# Patient Record
Sex: Female | Born: 1940 | Race: White | Hispanic: No | Smoking: Never smoker
Health system: Southern US, Community
[De-identification: ages and names within clinical notes are randomized; demographics above are authoritative.]

## PROBLEM LIST (undated history)

## (undated) DIAGNOSIS — K2100 Gastro-esophageal reflux disease with esophagitis, without bleeding: Secondary | ICD-10-CM

## (undated) DIAGNOSIS — I10 Essential (primary) hypertension: Secondary | ICD-10-CM

## (undated) DIAGNOSIS — F419 Anxiety disorder, unspecified: Secondary | ICD-10-CM

## (undated) DIAGNOSIS — I639 Cerebral infarction, unspecified: Secondary | ICD-10-CM

## (undated) DIAGNOSIS — J45909 Unspecified asthma, uncomplicated: Secondary | ICD-10-CM

## (undated) DIAGNOSIS — E785 Hyperlipidemia, unspecified: Secondary | ICD-10-CM

## (undated) DIAGNOSIS — Q782 Osteopetrosis: Secondary | ICD-10-CM

## (undated) DIAGNOSIS — I2510 Atherosclerotic heart disease of native coronary artery without angina pectoris: Secondary | ICD-10-CM

## (undated) HISTORY — PX: SHOULDER SURGERY: SHX246

---

## 2005-11-01 ENCOUNTER — Ambulatory Visit: Payer: Self-pay | Admitting: Family Medicine

## 2005-11-28 ENCOUNTER — Ambulatory Visit: Payer: Self-pay | Admitting: Family Medicine

## 2007-09-04 ENCOUNTER — Emergency Department: Payer: Self-pay | Admitting: Emergency Medicine

## 2010-02-09 ENCOUNTER — Ambulatory Visit: Payer: Self-pay | Admitting: Family Medicine

## 2011-01-01 DIAGNOSIS — E785 Hyperlipidemia, unspecified: Secondary | ICD-10-CM | POA: Insufficient documentation

## 2011-01-01 DIAGNOSIS — I10 Essential (primary) hypertension: Secondary | ICD-10-CM | POA: Insufficient documentation

## 2011-03-13 ENCOUNTER — Ambulatory Visit: Payer: Self-pay | Admitting: Family Medicine

## 2012-07-01 DIAGNOSIS — F419 Anxiety disorder, unspecified: Secondary | ICD-10-CM | POA: Insufficient documentation

## 2012-07-28 ENCOUNTER — Ambulatory Visit: Payer: Self-pay | Admitting: Family Medicine

## 2013-08-10 ENCOUNTER — Ambulatory Visit: Payer: Self-pay | Admitting: Family Medicine

## 2014-12-29 DIAGNOSIS — M7582 Other shoulder lesions, left shoulder: Secondary | ICD-10-CM | POA: Insufficient documentation

## 2015-10-25 ENCOUNTER — Other Ambulatory Visit: Payer: Self-pay | Admitting: Family Medicine

## 2015-10-25 DIAGNOSIS — Z1231 Encounter for screening mammogram for malignant neoplasm of breast: Secondary | ICD-10-CM

## 2015-10-25 DIAGNOSIS — M810 Age-related osteoporosis without current pathological fracture: Secondary | ICD-10-CM

## 2015-11-23 ENCOUNTER — Ambulatory Visit
Admission: RE | Admit: 2015-11-23 | Discharge: 2015-11-23 | Disposition: A | Discharge status: Home / Self Care | Payer: Medicare Other | Source: Ambulatory Visit | Attending: Family Medicine | Admitting: Family Medicine

## 2015-11-23 ENCOUNTER — Other Ambulatory Visit: Payer: Self-pay | Admitting: Family Medicine

## 2015-11-23 DIAGNOSIS — M810 Age-related osteoporosis without current pathological fracture: Secondary | ICD-10-CM | POA: Insufficient documentation

## 2015-11-23 DIAGNOSIS — Z1231 Encounter for screening mammogram for malignant neoplasm of breast: Secondary | ICD-10-CM | POA: Diagnosis not present

## 2015-11-23 DIAGNOSIS — M85851 Other specified disorders of bone density and structure, right thigh: Secondary | ICD-10-CM | POA: Diagnosis not present

## 2015-11-23 DIAGNOSIS — Z8262 Family history of osteoporosis: Secondary | ICD-10-CM | POA: Diagnosis not present

## 2016-04-27 ENCOUNTER — Emergency Department
Admission: EM | Admit: 2016-04-27 | Discharge: 2016-04-27 | Disposition: A | Discharge status: Home / Self Care | Payer: Medicare Other | Attending: Emergency Medicine | Admitting: Emergency Medicine

## 2016-04-27 ENCOUNTER — Encounter: Payer: Self-pay | Admitting: Emergency Medicine

## 2016-04-27 DIAGNOSIS — J45909 Unspecified asthma, uncomplicated: Secondary | ICD-10-CM | POA: Insufficient documentation

## 2016-04-27 DIAGNOSIS — I2510 Atherosclerotic heart disease of native coronary artery without angina pectoris: Secondary | ICD-10-CM | POA: Insufficient documentation

## 2016-04-27 DIAGNOSIS — Y999 Unspecified external cause status: Secondary | ICD-10-CM | POA: Diagnosis not present

## 2016-04-27 DIAGNOSIS — I10 Essential (primary) hypertension: Secondary | ICD-10-CM | POA: Insufficient documentation

## 2016-04-27 DIAGNOSIS — W260XXA Contact with knife, initial encounter: Secondary | ICD-10-CM | POA: Insufficient documentation

## 2016-04-27 DIAGNOSIS — Y92000 Kitchen of unspecified non-institutional (private) residence as  the place of occurrence of the external cause: Secondary | ICD-10-CM | POA: Insufficient documentation

## 2016-04-27 DIAGNOSIS — Y939 Activity, unspecified: Secondary | ICD-10-CM | POA: Diagnosis not present

## 2016-04-27 DIAGNOSIS — S61210A Laceration without foreign body of right index finger without damage to nail, initial encounter: Secondary | ICD-10-CM | POA: Insufficient documentation

## 2016-04-27 HISTORY — DX: Essential (primary) hypertension: I10

## 2016-04-27 HISTORY — DX: Osteopetrosis: Q78.2

## 2016-04-27 HISTORY — DX: Atherosclerotic heart disease of native coronary artery without angina pectoris: I25.10

## 2016-04-27 HISTORY — DX: Hyperlipidemia, unspecified: E78.5

## 2016-04-27 HISTORY — DX: Gastro-esophageal reflux disease with esophagitis, without bleeding: K21.00

## 2016-04-27 HISTORY — DX: Unspecified asthma, uncomplicated: J45.909

## 2016-04-27 HISTORY — DX: Gastro-esophageal reflux disease with esophagitis: K21.0

## 2016-04-27 HISTORY — DX: Anxiety disorder, unspecified: F41.9

## 2016-04-27 NOTE — ED Provider Notes (Signed)
[facility] Emergency Department Provider Note  ____________________________________________  Time seen: Approximately 8:54 PM  I have reviewed the triage vital signs and the nursing notes.   HISTORY  Chief Complaint Extremity Laceration    HPI Monica Bush Test is a 75 y.o. female that presents to the emergency department with laceration from a kitchen knife over right index finger. Patient states that she did not think the laceration was very deep but it kept bleeding when she bent her finger.She applied liquid bandage to finger. Patient took Tylenol for pain. Patient's last tetanus shot was less than one year ago.   Past Medical History:  Diagnosis Date  . Anxiety   . Asthma   . CAD (coronary artery disease)   . Hyperlipemia   . Hypertension   . Osteopetrosis   . Reflux esophagitis     There are no active problems to display for this patient.   Past Surgical History:  Procedure Laterality Date  . SHOULDER SURGERY      Prior to Admission medications   Not on File    Allergies Alprazolam; Clarithromycin; Levaquin [levofloxacin in d5w]; Tramadol; Xopenex [levalbuterol hcl]; and Mobic [meloxicam]  Family History  Problem Relation Age of Onset  . Breast cancer Neg Hx     Social History Social History  Substance Use Topics  . Smoking status: Never Smoker  . Smokeless tobacco: Never Used  . Alcohol use Yes     Review of Systems  Constitutional: No fever/chills Cardiovascular: No chest pain. Respiratory: No SOB. Gastrointestinal: No abdominal pain.  No nausea, no vomiting.  Skin: Negative for ecchymosis. Neurological: Negative for numbness or tingling   ____________________________________________   PHYSICAL EXAM:  VITAL SIGNS: ED Triage Vitals [04/27/16 2005]  Enc Vitals Group     BP (!) 156/78     Pulse Rate 84     Resp 18     Temp 98.7 F (37.1 C)     Temp Source Oral     SpO2 97 %     Weight 164 lb (74.4 kg)   Height 5\' 1"  (1.549 m)     Head Circumference      Peak Flow      Pain Score 2     Pain Loc      Pain Edu?      Excl. in GC?      Constitutional: Alert and oriented. Well appearing and in no acute distress. Eyes: Conjunctivae are normal. PERRL. EOMI. Head: Atraumatic. ENT:      Ears:      Nose: No congestion/rhinnorhea.      Mouth/Throat: Mucous membranes are moist.  Neck: No stridor.  Cardiovascular: Normal rate, regular rhythm. Normal S1 and S2.  Good peripheral circulation. Respiratory: Normal respiratory effort without tachypnea or retractions. Lungs CTAB. Good air entry to the bases with no decreased or absent breath sounds. Musculoskeletal: Full range of motion to all extremities. No gross deformities appreciated. Full range of motion of finger. Neurologic:  Normal speech and language. No gross focal neurologic deficits are appreciated. Sensation of fingertip intact. Skin:  Skin is warm, dry. No rash noted. 1/2 cm shallow laceration over side of right index finger. No bleeding. Psychiatric: Mood and affect are normal. Speech and behavior are normal. Patient exhibits appropriate insight and judgement.   ____________________________________________   LABS (all labs ordered are listed, but only abnormal results are displayed)  Labs Reviewed - No data to display ____________________________________________  EKG   ____________________________________________  RADIOLOGY  No results found.  ____________________________________________    PROCEDURES  Procedure(s) performed:    Procedures  Wound was cleaned with normal saline and iodine. Dermabond and Steri-Strips were applied.  Medications - No data to display   ____________________________________________   INITIAL IMPRESSION / ASSESSMENT AND PLAN / ED COURSE  Pertinent labs & imaging results that were available during my care of the patient were reviewed by me and considered in my medical decision  making (see chart for details).  Review of the [place] CSRS was performed in accordance of the [facility] prior to dispensing any controlled drugs.  Clinical Course     Patient's diagnosis is consistent with finger laceration. Wound was cleaned. Dermabond and Steri-Strips were applied. Stitches are not deemed necessary as the laceration was very shallow. Tetanus shot is up-to-date. Patient is to follow up with PCP as directed. Patient is given ED precautions to return to the ED for any worsening or new symptoms.     ____________________________________________  FINAL CLINICAL IMPRESSION(S) / ED DIAGNOSES  Final diagnoses:  Laceration of right index finger without foreign body without damage to nail, initial encounter      NEW MEDICATIONS STARTED DURING THIS VISIT:  There are no discharge medications for this patient.       This chart was dictated using voice recognition software/Dragon. Despite best efforts to proofread, errors can occur which can change the meaning. Any change was purely unintentional.    Enid Derryshley Tandre Conly, PA-C 04/27/16 2214    Jennye MoccasinBrian S Quigley, MD 04/27/16 2217

## 2016-04-27 NOTE — ED Notes (Signed)
Patient reports she was reaching for a knife at 1900 today and cut her index finger right hand proximal to hand. Pt has 1/2" laceration to finger. Pt reports she applied liquid bandage to laceration, however continues to have bleeding with movement.

## 2016-04-27 NOTE — ED Triage Notes (Signed)
presents to ED with laceration to right index finger. Bleeding controlled.

## 2016-04-27 NOTE — ED Notes (Signed)
Pt alert and oriented X4, active, cooperative, pt in NAD. RR even and unlabored, color WNL.  Pt informed to return if any life threatening symptoms occur.   

## 2016-10-23 ENCOUNTER — Other Ambulatory Visit: Payer: Self-pay | Admitting: Family Medicine

## 2016-10-23 DIAGNOSIS — Z1231 Encounter for screening mammogram for malignant neoplasm of breast: Secondary | ICD-10-CM

## 2016-11-20 ENCOUNTER — Emergency Department: Payer: Medicare Other

## 2016-11-20 ENCOUNTER — Encounter: Payer: Self-pay | Admitting: Emergency Medicine

## 2016-11-20 ENCOUNTER — Emergency Department
Admission: EM | Admit: 2016-11-20 | Discharge: 2016-11-20 | Disposition: A | Discharge status: Home / Self Care | Payer: Medicare Other | Attending: Emergency Medicine | Admitting: Emergency Medicine

## 2016-11-20 DIAGNOSIS — Z79899 Other long term (current) drug therapy: Secondary | ICD-10-CM | POA: Diagnosis not present

## 2016-11-20 DIAGNOSIS — I10 Essential (primary) hypertension: Secondary | ICD-10-CM | POA: Insufficient documentation

## 2016-11-20 DIAGNOSIS — W228XXA Striking against or struck by other objects, initial encounter: Secondary | ICD-10-CM | POA: Insufficient documentation

## 2016-11-20 DIAGNOSIS — S0083XA Contusion of other part of head, initial encounter: Secondary | ICD-10-CM | POA: Insufficient documentation

## 2016-11-20 DIAGNOSIS — Y929 Unspecified place or not applicable: Secondary | ICD-10-CM | POA: Diagnosis not present

## 2016-11-20 DIAGNOSIS — J45909 Unspecified asthma, uncomplicated: Secondary | ICD-10-CM | POA: Insufficient documentation

## 2016-11-20 DIAGNOSIS — Y939 Activity, unspecified: Secondary | ICD-10-CM | POA: Diagnosis not present

## 2016-11-20 DIAGNOSIS — R519 Headache, unspecified: Secondary | ICD-10-CM

## 2016-11-20 DIAGNOSIS — Y998 Other external cause status: Secondary | ICD-10-CM | POA: Insufficient documentation

## 2016-11-20 DIAGNOSIS — Z7982 Long term (current) use of aspirin: Secondary | ICD-10-CM | POA: Diagnosis not present

## 2016-11-20 DIAGNOSIS — R51 Headache: Secondary | ICD-10-CM | POA: Diagnosis present

## 2016-11-20 DIAGNOSIS — I2510 Atherosclerotic heart disease of native coronary artery without angina pectoris: Secondary | ICD-10-CM | POA: Insufficient documentation

## 2016-11-20 MED ORDER — BUTALBITAL-APAP-CAFFEINE 50-325-40 MG PO TABS: 1.0000 | ORAL_TABLET | Freq: Four times a day (QID) | ORAL | 0 refills | Status: AC | PRN

## 2016-11-20 NOTE — ED Provider Notes (Signed)
[facility] Emergency Department Provider Note   ____________________________________________   First MD Initiated Contact with Patient 11/20/16 [phone]     (approximate)  I have reviewed the triage vital signs and the nursing notes.   HISTORY  Chief Complaint Headache    HPI Monica Bush is a 76 y.o. female patient complaining of increasing headache since striking her forehead on a table yesterday. Patient denies vision change, vertigo, nausea or vomiting. Patient state mild relief taking Tylenol. Patient state lack of sleep last night secondary to headache. Patient rates her headache as a 4/10. Patient described the pain as "achy".   Past Medical History:  Diagnosis Date  . Anxiety   . Asthma   . CAD (coronary artery disease)   . Hyperlipemia   . Hypertension   . Osteopetrosis   . Reflux esophagitis     There are no active problems to display for this patient.   Past Surgical History:  Procedure Laterality Date  . SHOULDER SURGERY      Prior to Admission medications   Medication Sig Start Date End Date Taking? Authorizing Provider  amLODipine (NORVASC) 10 MG tablet Take 10 mg by mouth daily.   Yes [provider]  aspirin 81 MG chewable tablet Chew 81 mg by mouth daily.   Yes [provider]  atorvastatin (LIPITOR) 40 MG tablet Take 40 mg by mouth daily.   Yes [provider]  butalbital-acetaminophen-caffeine (FIORICET, ESGIC) [phone] MG tablet Take 1 tablet by mouth every 6 (six) hours as needed for headache. 11/20/16 11/20/17  Joni Reining, PA-C    Allergies Alprazolam; Clarithromycin; Levaquin [levofloxacin in d5w]; Tramadol; Xopenex [levalbuterol hcl]; and Mobic [meloxicam]  Family History  Problem Relation Age of Onset  . Breast cancer Neg Hx     Social History Social History  Substance Use Topics  . Smoking status: Never Smoker  . Smokeless tobacco: Never Used  . Alcohol use Yes    Review  of Systems  Constitutional: No fever/chills Eyes: No visual changes. ENT: No sore throat. Cardiovascular: Denies chest pain. Respiratory: Denies shortness of breath. Gastrointestinal: No abdominal pain.  No nausea, no vomiting.  No diarrhea.  No constipation. Genitourinary: Negative for dysuria. Musculoskeletal: Negative for back pain. Skin: Negative for rash. Neurological: Positive for headaches, denies focal weakness or numbness. Psychiatric:Anxiety Endocrine:Hyperlipidemia hypertension Allergic/Immunilogical: See medication list ____________________________________________   PHYSICAL EXAM:  VITAL SIGNS: ED Triage Vitals [11/20/16 0838]  Enc Vitals Group     BP (!) 142/81     Pulse Rate 78     Resp 17     Temp 98.6 F (37 C)     Temp Source Oral     SpO2 100 %     Weight 157 lb (71.2 kg)     Height 4\' 11"  (1.499 m)     Head Circumference      Peak Flow      Pain Score 4     Pain Loc      Pain Edu?      Excl. in GC?     Constitutional: Alert and oriented. Well appearing and in no acute distress. Eyes: Conjunctivae are normal. PERRL. EOMI. Head: Moderate guarding palpation forehead.  Nose: No congestion/rhinnorhea. Mouth/Throat: Mucous membranes are moist.  Oropharynx non-erythematous. Neck: No stridor.   Cardiovascular: Normal rate, regular rhythm. Grossly normal heart sounds.  Good peripheral circulation. Respiratory: Normal respiratory effort.  No retractions. Lungs CTAB. Neurologic:  Normal speech and language. No gross  focal neurologic deficits are appreciated. No gait instability. Skin:  Skin is warm, dry and intact. No rash noted. Psychiatric: Mood and affect are normal. Speech and behavior are normal.  ____________________________________________   LABS (all labs ordered are listed, but only abnormal results are displayed)  Labs Reviewed - No data to  display ____________________________________________  EKG   ____________________________________________  RADIOLOGY  Ct Head Wo Contrast  Result Date: 11/20/2016 CLINICAL DATA:  Hit head on the right side yesterday.  Frontal pain. EXAM: CT HEAD WITHOUT CONTRAST TECHNIQUE: Contiguous axial images were obtained from the base of the skull through the vertex without intravenous contrast. COMPARISON:  None. FINDINGS: Brain: No evidence of acute infarction, hemorrhage, hydrocephalus, extra-axial collection or mass lesion/mass effect. 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 Mild periventricular white matter low attenuation as can be seen with microvascular disease. Vascular: No hyperdense vessel or unexpected calcification. Skull: No osseous abnormality. Sinuses/Orbits: Visualized paranasal sinuses are clear. Visualized mastoid sinuses are clear. Visualized orbits demonstrate no focal abnormality. Other: None IMPRESSION: 1. 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 2. Otherwise, no acute intracranial pathology. Electronically Signed   By: Elige KoHetal  Patel   On: 11/20/2016 09:25    ____No acute findings on CT of the head ________________________________________   PROCEDURES  Procedure(s) performed: None  Procedures  Critical Care performed: No  ____________________________________________   INITIAL IMPRESSION / ASSESSMENT AND PLAN / ED COURSE  Pertinent labs & imaging results that were available during my care of the patient were reviewed by me and considered in my medical decision making (see chart for details).  Headache secondary to forehead contusion. Discussed negative findings on CT scan in the head with patient. Patient given discharge care instructions. Patient advised follow-up PCP headaches  persists.      ____________________________________________   FINAL CLINICAL IMPRESSION(S) / ED DIAGNOSES  Final diagnoses:  Bad headache      NEW MEDICATIONS STARTED DURING THIS VISIT:  New Prescriptions   BUTALBITAL-ACETAMINOPHEN-CAFFEINE (FIORICET, ESGIC) 50-325-40 MG TABLET    Take 1 tablet by mouth every 6 (six) hours as needed for headache.     Note:  This document was prepared using Dragon voice recognition software and may include unintentional dictation errors.    Joni ReiningSmith, Bee Hammerschmidt K, PA-C 11/20/16 [id number]    Rockne MenghiniNorman, Anne-Caroline, MD 11/20/16 [phone]

## 2016-11-20 NOTE — ED Notes (Signed)
See triage note  states she was cleaning under a table yesterday  Hit the edge of table  conts to have headache  Denies any n/v or blurred vision

## 2016-11-20 NOTE — Discharge Instructions (Signed)
Take medication as directed.

## 2016-11-20 NOTE — ED Triage Notes (Signed)
Pt states she was cleaning a high top table yesterday when she went to stand up she hit her head on the edge of the table and has had a HA since. Denies any blurred vision, vomiting or any other neuro sx..Marland Kitchen

## 2016-11-26 ENCOUNTER — Ambulatory Visit
Admission: RE | Admit: 2016-11-26 | Discharge: 2016-11-26 | Disposition: A | Discharge status: Home / Self Care | Payer: Medicare Other | Source: Ambulatory Visit | Attending: Family Medicine | Admitting: Family Medicine

## 2016-11-26 DIAGNOSIS — Z1231 Encounter for screening mammogram for malignant neoplasm of breast: Secondary | ICD-10-CM | POA: Diagnosis not present

## 2017-10-31 ENCOUNTER — Other Ambulatory Visit: Payer: Self-pay | Admitting: Family Medicine

## 2017-10-31 DIAGNOSIS — Z780 Asymptomatic menopausal state: Secondary | ICD-10-CM

## 2017-10-31 DIAGNOSIS — Z1231 Encounter for screening mammogram for malignant neoplasm of breast: Secondary | ICD-10-CM

## 2017-12-05 ENCOUNTER — Other Ambulatory Visit: Payer: Medicare Other

## 2017-12-25 ENCOUNTER — Ambulatory Visit
Admission: RE | Admit: 2017-12-25 | Discharge: 2017-12-25 | Disposition: A | Discharge status: Home / Self Care | Payer: Medicare Other | Source: Ambulatory Visit | Attending: Family Medicine | Admitting: Family Medicine

## 2017-12-25 DIAGNOSIS — Z780 Asymptomatic menopausal state: Secondary | ICD-10-CM | POA: Diagnosis present

## 2017-12-25 DIAGNOSIS — Z1231 Encounter for screening mammogram for malignant neoplasm of breast: Secondary | ICD-10-CM | POA: Insufficient documentation

## 2018-05-29 ENCOUNTER — Encounter: Payer: Self-pay | Admitting: *Deleted

## 2018-05-29 ENCOUNTER — Encounter: Payer: Medicare Other | Attending: Cardiology | Admitting: *Deleted

## 2018-05-29 VITALS — Ht 61.5 in | Wt 163.3 lb

## 2018-05-29 DIAGNOSIS — Z955 Presence of coronary angioplasty implant and graft: Secondary | ICD-10-CM

## 2018-05-29 DIAGNOSIS — M810 Age-related osteoporosis without current pathological fracture: Secondary | ICD-10-CM | POA: Insufficient documentation

## 2018-05-29 DIAGNOSIS — Z9861 Coronary angioplasty status: Secondary | ICD-10-CM | POA: Insufficient documentation

## 2018-05-29 DIAGNOSIS — I2510 Atherosclerotic heart disease of native coronary artery without angina pectoris: Secondary | ICD-10-CM | POA: Insufficient documentation

## 2018-05-29 DIAGNOSIS — J45901 Unspecified asthma with (acute) exacerbation: Secondary | ICD-10-CM | POA: Insufficient documentation

## 2018-05-29 NOTE — Progress Notes (Signed)
Daily Session Note  Patient Details  Name: Monica Bush MRN: [id number] Date of Birth: [birth year] Referring Provider:     Cardiac Rehab from 05/29/2018 in [facility] [place] [facility]  Referring Provider  kelsey      Encounter Date: 05/29/2018  Check In: Session Check In - 05/29/18 1459      Check-In   Supervising physician immediately available to respond to emergencies  See telemetry face sheet for immediately available ER MD    Location  [facility]-Cardiac & Pulmonary Rehab    Staff Present  Renita Papa, RN Vickki Hearing, BA, ACSM CEP, Exercise Physiologist    Medication changes reported      No    Fall or balance concerns reported     No    Warm-up and Cool-down  Not performed (comment)   med review completed   Resistance Training Performed  Yes    VAD Patient?  No    PAD/SET Patient?  No      Pain Assessment   Currently in Pain?  No/denies        Exercise Prescription Changes - 05/29/18 1400      Response to Exercise   Blood Pressure (Admit)  124/70    Blood Pressure (Exercise)  124/66    Blood Pressure (Exit)  122/66    Heart Rate (Admit)  72 bpm    Heart Rate (Exercise)  112 bpm    Heart Rate (Exit)  75 bpm    Oxygen Saturation (Admit)  98 %    Oxygen Saturation (Exercise)  99 %    Rating of Perceived Exertion (Exercise)  11       Social History   Tobacco Use  Smoking Status Never Smoker  Smokeless Tobacco Never Used    Goals Met:  Proper associated with RPD/PD & O2 Sat Exercise tolerated well Personal goals reviewed No report of cardiac concerns or symptoms Strength training completed today  Goals Unmet:  Not Applicable  Comments: Med Review completed   Dr. Emily Filbert is Medical Director for [facility].

## 2018-05-29 NOTE — Patient Instructions (Signed)
Patient Instructions  Patient Details  Name: Monica Bush MRN: [id number] Date of Birth: 19-Apr-1941 Referring Provider:  Ramond Dial,*  Below are your personal goals for exercise, nutrition, and risk factors. Our goal is to help you stay on track towards obtaining and maintaining these goals. We will be discussing your progress on these goals with you throughout the program.  Initial Exercise Prescription: Initial Exercise Prescription - 05/29/18 1400      Date of Initial Exercise RX and Referring Provider   Date  05/29/18    Referring Provider  kelsey      Treadmill   MPH  1.7    Grade  0    Minutes  15    METs  2.3      NuStep   Level  2    SPM  80    Minutes  15    METs  2.3      Biostep-RELP   Level  2    SPM  50    Minutes  15    METs  2      Prescription Details   Frequency (times per week)  3    Duration  Progress to 30 minutes of continuous aerobic without signs/symptoms of physical distress      Intensity   THRR 40-80% of Max Heartrate  101-130    Ratings of Perceived Exertion  11-15    Perceived Dyspnea  0-4      Resistance Training   Training Prescription  Yes    Weight  3 lb    Reps  10-15       Exercise Goals: Frequency: Be able to perform aerobic exercise two to three times per week in program working toward 2-5 days per week of home exercise.  Intensity: Work with a perceived exertion of 11 (fairly light) - 15 (hard) while following your exercise prescription.  We will make changes to your prescription with you as you progress through the program.   Duration: Be able to do 30 to 45 minutes of continuous aerobic exercise in addition to a 5 minute warm-up and a 5 minute cool-down routine.   Nutrition Goals: Your personal nutrition goals will be established when you do your nutrition analysis with the dietician.  The following are general nutrition guidelines to follow: Cholesterol < 200mg /day Sodium < 1500mg /day Fiber: Women over 50  yrs - 21 grams per day  Personal Goals: Personal Goals and Risk Factors at Admission - 05/29/18 1512      Core Components/Risk Factors/Patient Goals on Admission    Weight Management  Yes;Weight Loss    Intervention  Weight Management: Develop a combined nutrition and exercise program designed to reach desired caloric intake, while maintaining appropriate intake of nutrient and fiber, sodium and fats, and appropriate energy expenditure required for the weight goal.;Weight Management: Provide education and appropriate resources to help participant work on and attain dietary goals.;Weight Management/Obesity: Establish reasonable short term and long term weight goals.    Admit Weight  163 lb 4.8 oz (74.1 kg)    Goal Weight: Short Term  158 lb (71.7 kg)    Goal Weight: Long Term  143 lb (64.9 kg)    Expected Outcomes  Short Term: Continue to assess and modify interventions until short term weight is achieved;Long Term: Adherence to nutrition and physical activity/exercise program aimed toward attainment of established weight goal;Weight Loss: Understanding of general recommendations for a balanced deficit meal plan, which promotes 1-2 lb weight loss per  week and includes a negative energy balance of [id number] kcal/d;Understanding recommendations for meals to include 15-35% energy as protein, 25-35% energy from fat, 35-60% energy from carbohydrates, less than 200mg  of dietary cholesterol, 20-35 gm of total fiber daily;Understanding of distribution of calorie intake throughout the day with the consumption of 4-5 meals/snacks    Hypertension  Yes    Intervention  Provide education on lifestyle modifcations including regular physical activity/exercise, weight management, moderate sodium restriction and increased consumption of fresh fruit, vegetables, and low fat dairy, alcohol moderation, and smoking cessation.;Monitor prescription use compliance.    Expected Outcomes  Short Term: Continued assessment and  intervention until BP is < 140/10590mm HG in hypertensive participants. < 130/3180mm HG in hypertensive participants with diabetes, heart failure or chronic kidney disease.;Long Term: Maintenance of blood pressure at goal levels.    Lipids  Yes    Intervention  Provide education and support for participant on nutrition & aerobic/resistive exercise along with prescribed medications to achieve LDL 70mg , HDL >40mg .    Expected Outcomes  Short Term: Participant states understanding of desired cholesterol values and is compliant with medications prescribed. Participant is following exercise prescription and nutrition guidelines.;Long Term: Cholesterol controlled with medications as prescribed, with individualized exercise RX and with personalized nutrition plan. Value goals: LDL < 70mg , HDL > 40 mg.       Tobacco Use Initial Evaluation: Social History   Tobacco Use  Smoking Status Never Smoker  Smokeless Tobacco Never Used    Exercise Goals and Review: Exercise Goals    Row Name 05/29/18 1446             Exercise Goals   Increase Physical Activity  Yes       Intervention  Provide advice, education, support and counseling about physical activity/exercise needs.;Develop an individualized exercise prescription for aerobic and resistive training based on initial evaluation findings, risk stratification, comorbidities and participant's personal goals.       Expected Outcomes  Short Term: Attend rehab on a regular basis to increase amount of physical activity.;Long Term: Add in home exercise to make exercise part of routine and to increase amount of physical activity.;Long Term: Exercising regularly at least 3-5 days a week.       Increase Strength and Stamina  Yes       Intervention  Provide advice, education, support and counseling about physical activity/exercise needs.;Develop an individualized exercise prescription for aerobic and resistive training based on initial evaluation findings, risk  stratification, comorbidities and participant's personal goals.       Expected Outcomes  Short Term: Increase workloads from initial exercise prescription for resistance, speed, and METs.;Short Term: Perform resistance training exercises routinely during rehab and add in resistance training at home;Long Term: Improve cardiorespiratory fitness, muscular endurance and strength as measured by increased METs and functional capacity (6MWT)       Able to understand and use rate of perceived exertion (RPE) scale  Yes       Intervention  Provide education and explanation on how to use RPE scale       Expected Outcomes  Short Term: Able to use RPE daily in rehab to express subjective intensity level;Long Term:  Able to use RPE to guide intensity level when exercising independently       Knowledge and understanding of Target Heart Rate Range (THRR)  Yes       Intervention  Provide education and explanation of THRR including how the numbers were predicted and where they are located  for reference       Expected Outcomes  Short Term: Able to use daily as guideline for intensity in rehab;Short Term: Able to state/look up THRR;Long Term: Able to use THRR to govern intensity when exercising independently       Able to check pulse independently  Yes       Intervention  Provide education and demonstration on how to check pulse in carotid and radial arteries.;Review the importance of being able to check your own pulse for safety during independent exercise       Expected Outcomes  Short Term: Able to explain why pulse checking is important during independent exercise;Long Term: Able to check pulse independently and accurately       Understanding of Exercise Prescription  Yes       Intervention  Provide education, explanation, and written materials on patient's individual exercise prescription       Expected Outcomes  Short Term: Able to explain program exercise prescription;Long Term: Able to explain home exercise  prescription to exercise independently          Copy of goals given to participant.

## 2018-05-29 NOTE — Progress Notes (Signed)
Cardiac Individual Treatment Plan  Patient Details  Name: Monica Bush MRN: [id number] Date of Birth: January 24, 1941 Referring Provider:     Cardiac Rehab from 05/29/2018 in [facility] Cardiac and Pulmonary Rehab  Referring Provider  kelsey      Initial Encounter Date:    Cardiac Rehab from 05/29/2018 in [facility] Cardiac and Pulmonary Rehab  Date  05/29/18      Visit Diagnosis: S/P PTCA (percutaneous transluminal coronary angioplasty)  Status post coronary artery stent placement  Patient's Home Medications on Admission:  Current Outpatient Medications:  .  amLODipine (NORVASC) 10 MG tablet, Take 10 mg by mouth daily., Disp: , Rfl:  .  aspirin 81 MG chewable tablet, Chew 81 mg by mouth daily., Disp: , Rfl:  .  atorvastatin (LIPITOR) 40 MG tablet, Take 40 mg by mouth daily., Disp: , Rfl:  .  augmented betamethasone dipropionate (DIPROLENE-AF) 0.05 % ointment, Apply topically., Disp: , Rfl:  .  BRILINTA 90 MG TABS tablet, , Disp: , Rfl:  .  chlorpheniramine (CHLOR-TRIMETON) 4 MG tablet, Take by mouth., Disp: , Rfl:  .  Cholecalciferol (VITAMIN D3) 25 MCG (1000 UT) CAPS, Take by mouth., Disp: , Rfl:  .  fluticasone (FLONASE) 50 MCG/ACT nasal spray, Place into the nose., Disp: , Rfl:  .  metroNIDAZOLE (METROGEL) 1 % gel, Apply topically., Disp: , Rfl:  .  nitroGLYCERIN (NITROSTAT) 0.4 MG SL tablet, , Disp: , Rfl:   Past Medical History: Past Medical History:  Diagnosis Date  . Anxiety   . Asthma   . CAD (coronary artery disease)   . Hyperlipemia   . Hypertension   . Osteopetrosis   . Reflux esophagitis     Tobacco Use: Social History   Tobacco Use  Smoking Status Never Smoker  Smokeless Tobacco Never Used    Labs: Recent Review Flowsheet Data    There is no flowsheet data to display.       Exercise Target Goals: Exercise Program Goal: Individual exercise prescription set using results from initial 6 min walk test and THRR while considering  patient's activity barriers and  safety.   Exercise Prescription Goal: Initial exercise prescription builds to 30-45 minutes a day of aerobic activity, 2-3 days per week.  Home exercise guidelines will be given to patient during program as part of exercise prescription that the participant will acknowledge.  Activity Barriers & Risk Stratification:   6 Minute Walk: 6 Minute Walk    Row Name 05/29/18 1447         6 Minute Walk   Phase  Initial     Distance  1228 feet     Walk Time  6 minutes     # of Rest Breaks  0     MPH  2.33     METS  2.31     RPE  11     VO2 Peak  8.08     Symptoms  No     Resting HR  72 bpm     Resting BP  124/70     Resting Oxygen Saturation   98 %     Exercise Oxygen Saturation  during 6 min walk  99 %     Max Ex. HR  112 bpm     Max Ex. BP  124/66     2 Minute Post BP  122/66        Oxygen Initial Assessment:   Oxygen Re-Evaluation:   Oxygen Discharge (Final Oxygen Re-Evaluation):   Initial Exercise  Prescription: Initial Exercise Prescription - 05/29/18 1400      Date of Initial Exercise RX and Referring Provider   Date  05/29/18    Referring Provider  kelsey      Treadmill   MPH  1.7    Grade  0    Minutes  15    METs  2.3      NuStep   Level  2    SPM  80    Minutes  15    METs  2.3      Biostep-RELP   Level  2    SPM  50    Minutes  15    METs  2      Prescription Details   Frequency (times per week)  3    Duration  Progress to 30 minutes of continuous aerobic without signs/symptoms of physical distress      Intensity   THRR 40-80% of Max Heartrate  101-130    Ratings of Perceived Exertion  11-15    Perceived Dyspnea  0-4      Resistance Training   Training Prescription  Yes    Weight  3 lb    Reps  10-15       Perform Capillary Blood Glucose checks as needed.  Exercise Prescription Changes: Exercise Prescription Changes    Row Name 05/29/18 1400             Response to Exercise   Blood Pressure (Admit)  124/70       Blood  Pressure (Exercise)  124/66       Blood Pressure (Exit)  122/66       Heart Rate (Admit)  72 bpm       Heart Rate (Exercise)  112 bpm       Heart Rate (Exit)  75 bpm       Oxygen Saturation (Admit)  98 %       Oxygen Saturation (Exercise)  99 %       Rating of Perceived Exertion (Exercise)  11          Exercise Comments:   Exercise Goals and Review: Exercise Goals    Row Name 05/29/18 1446             Exercise Goals   Increase Physical Activity  Yes       Intervention  Provide advice, education, support and counseling about physical activity/exercise needs.;Develop an individualized exercise prescription for aerobic and resistive training based on initial evaluation findings, risk stratification, comorbidities and participant's personal goals.       Expected Outcomes  Short Term: Attend rehab on a regular basis to increase amount of physical activity.;Long Term: Add in home exercise to make exercise part of routine and to increase amount of physical activity.;Long Term: Exercising regularly at least 3-5 days a week.       Increase Strength and Stamina  Yes       Intervention  Provide advice, education, support and counseling about physical activity/exercise needs.;Develop an individualized exercise prescription for aerobic and resistive training based on initial evaluation findings, risk stratification, comorbidities and participant's personal goals.       Expected Outcomes  Short Term: Increase workloads from initial exercise prescription for resistance, speed, and METs.;Short Term: Perform resistance training exercises routinely during rehab and add in resistance training at home;Long Term: Improve cardiorespiratory fitness, muscular endurance and strength as measured by increased METs and functional capacity (6MWT)  Able to understand and use rate of perceived exertion (RPE) scale  Yes       Intervention  Provide education and explanation on how to use RPE scale       Expected  Outcomes  Short Term: Able to use RPE daily in rehab to express subjective intensity level;Long Term:  Able to use RPE to guide intensity level when exercising independently       Knowledge and understanding of Target Heart Rate Range (THRR)  Yes       Intervention  Provide education and explanation of THRR including how the numbers were predicted and where they are located for reference       Expected Outcomes  Short Term: Able to use daily as guideline for intensity in rehab;Short Term: Able to state/look up THRR;Long Term: Able to use THRR to govern intensity when exercising independently       Able to check pulse independently  Yes       Intervention  Provide education and demonstration on how to check pulse in carotid and radial arteries.;Review the importance of being able to check your own pulse for safety during independent exercise       Expected Outcomes  Short Term: Able to explain why pulse checking is important during independent exercise;Long Term: Able to check pulse independently and accurately       Understanding of Exercise Prescription  Yes       Intervention  Provide education, explanation, and written materials on patient's individual exercise prescription       Expected Outcomes  Short Term: Able to explain program exercise prescription;Long Term: Able to explain home exercise prescription to exercise independently          Exercise Goals Re-Evaluation :   Discharge Exercise Prescription (Final Exercise Prescription Changes): Exercise Prescription Changes - 05/29/18 1400      Response to Exercise   Blood Pressure (Admit)  124/70    Blood Pressure (Exercise)  124/66    Blood Pressure (Exit)  122/66    Heart Rate (Admit)  72 bpm    Heart Rate (Exercise)  112 bpm    Heart Rate (Exit)  75 bpm    Oxygen Saturation (Admit)  98 %    Oxygen Saturation (Exercise)  99 %    Rating of Perceived Exertion (Exercise)  11       Nutrition:  Target Goals: Understanding of  nutrition guidelines, daily intake of sodium '1500mg'$ , cholesterol '200mg'$ , calories 30% from fat and 7% or less from saturated fats, daily to have 5 or more servings of fruits and vegetables.  Biometrics: Pre Biometrics - 05/29/18 1446      Pre Biometrics   Height  5' 1.5" (1.562 m)    Weight  163 lb 4.8 oz (74.1 kg)    Waist Circumference  35 inches    Hip Circumference  43 inches    Waist to Hip Ratio  0.81 %    BMI (Calculated)  30.36    Single Leg Stand  10.68 seconds        Nutrition Therapy Plan and Nutrition Goals: Nutrition Therapy & Goals - 05/29/18 1522      Intervention Plan   Intervention  Prescribe, educate and counsel regarding individualized specific dietary modifications aiming towards targeted core components such as weight, hypertension, lipid management, diabetes, heart failure and other comorbidities.;Nutrition handout(s) given to patient.    Expected Outcomes  Short Term Goal: Understand basic principles of dietary content, such as calories,  fat, sodium, cholesterol and nutrients.;Long Term Goal: Adherence to prescribed nutrition plan.;Short Term Goal: A plan has been developed with personal nutrition goals set during dietitian appointment.       Nutrition Assessments: Nutrition Assessments - 05/29/18 1522      MEDFICTS Scores   Pre Score  18       Nutrition Goals Re-Evaluation:   Nutrition Goals Discharge (Final Nutrition Goals Re-Evaluation):   Psychosocial: Target Goals: Acknowledge presence or absence of significant depression and/or stress, maximize coping skills, provide positive support system. Participant is able to verbalize types and ability to use techniques and skills needed for reducing stress and depression.   Initial Review & Psychosocial Screening: Initial Psych Review & Screening - 05/29/18 1520      Initial Review   Current issues with  Current Stress Concerns    Source of Stress Concerns  Unable to perform yard/household  activities      [place]?  Yes   friends     Barriers   Psychosocial barriers to participate in program  There are no identifiable barriers or psychosocial needs.;The patient should benefit from training in stress management and relaxation.      Screening Interventions   Interventions  Encouraged to exercise;Program counselor consult;Provide feedback about the scores to participant;To provide support and resources with identified psychosocial needs    Expected Outcomes  Short Term goal: Utilizing psychosocial counselor, staff and physician to assist with identification of specific Stressors or current issues interfering with healing process. Setting desired goal for each stressor or current issue identified.;Long Term Goal: Stressors or current issues are controlled or eliminated.;Short Term goal: Identification and review with participant of any Quality of Life or Depression concerns found by scoring the questionnaire.;Long Term goal: The participant improves quality of Life and PHQ9 Scores as seen by post scores and/or verbalization of changes       Quality of Life Scores:  Quality of Life - 05/29/18 1522      Quality of Life   Select  Quality of Life      Quality of Life Scores   Health/Function Pre  24.43 %    Socioeconomic Pre  27.92 %    Psych/Spiritual Pre  18 %    Family Pre  24 %    GLOBAL Pre  23.55 %      Scores of 19 and below usually indicate a poorer quality of life in these areas.  A difference of  2-3 points is a clinically meaningful difference.  A difference of 2-3 points in the total score of the Quality of Life Index has been associated with significant improvement in overall quality of life, self-image, physical symptoms, and general health in studies assessing change in quality of life.  PHQ-9: Recent Review Flowsheet Data    Depression screen [facility] 2/9 05/29/2018   Decreased Interest 0   Down, Depressed, Hopeless 0   PHQ - 2 Score 0    Altered sleeping 0   Tired, decreased energy 1   Change in appetite 1   Feeling bad or failure about yourself  1   Trouble concentrating 0   Moving slowly or fidgety/restless 0   Suicidal thoughts 0   PHQ-9 Score 3   Difficult doing work/chores Somewhat difficult     Interpretation of Total Score  Total Score Depression Severity:  1-4 = Minimal depression, 5-9 = Mild depression, 10-14 = Moderate depression, 15-19 = Moderately severe depression, 20-27 = Severe  depression   Psychosocial Evaluation and Intervention:   Psychosocial Re-Evaluation:   Psychosocial Discharge (Final Psychosocial Re-Evaluation):   Vocational Rehabilitation: Provide vocational rehab assistance to qualifying candidates.   Vocational Rehab Evaluation & Intervention: Vocational Rehab - 05/29/18 1520      Initial Vocational Rehab Evaluation & Intervention   Assessment shows need for Vocational Rehabilitation  No       Education: Education Goals: Education classes will be provided on a variety of topics geared toward better understanding of heart health and risk factor modification. Participant will state understanding/return demonstration of topics presented as noted by education test scores.  Learning Barriers/Preferences: Learning Barriers/Preferences - 05/29/18 1518      Learning Barriers/Preferences   Learning Barriers  None    Learning Preferences  Individual Instruction       Education Topics:  AED/CPR: - Group verbal and written instruction with the use of models to demonstrate the basic use of the AED with the basic ABC's of resuscitation.   General Nutrition Guidelines/Fats and Fiber: -Group instruction provided by verbal, written material, models and posters to present the general guidelines for heart healthy nutrition. Gives an explanation and review of dietary fats and fiber.   Controlling Sodium/Reading Food Labels: -Group verbal and written material supporting the  discussion of sodium use in heart healthy nutrition. Review and explanation with models, verbal and written materials for utilization of the food label.   Exercise Physiology & General Exercise Guidelines: - Group verbal and written instruction with models to review the exercise physiology of the cardiovascular system and associated critical values. Provides general exercise guidelines with specific guidelines to those with heart or lung disease.    Aerobic Exercise & Resistance Training: - Gives group verbal and written instruction on the various components of exercise. Focuses on aerobic and resistive training programs and the benefits of this training and how to safely progress through these programs..   Flexibility, Balance, Mind/Body Relaxation: Provides group verbal/written instruction on the benefits of flexibility and balance training, including mind/body exercise modes such as yoga, pilates and tai chi.  Demonstration and skill practice provided.   Stress and Anxiety: - Provides group verbal and written instruction about the health risks of elevated stress and causes of high stress.  Discuss the correlation between heart/lung disease and anxiety and treatment options. Review healthy ways to manage with stress and anxiety.   Depression: - Provides group verbal and written instruction on the correlation between heart/lung disease and depressed mood, treatment options, and the stigmas associated with seeking treatment.   Anatomy & Physiology of the Heart: - Group verbal and written instruction and models provide basic cardiac anatomy and physiology, with the coronary electrical and arterial systems. Review of Valvular disease and Heart Failure   Cardiac Procedures: - Group verbal and written instruction to review commonly prescribed medications for heart disease. Reviews the medication, class of the drug, and side effects. Includes the steps to properly store meds and maintain the  prescription regimen. (beta blockers and nitrates)   Cardiac Medications I: - Group verbal and written instruction to review commonly prescribed medications for heart disease. Reviews the medication, class of the drug, and side effects. Includes the steps to properly store meds and maintain the prescription regimen.   Cardiac Medications II: -Group verbal and written instruction to review commonly prescribed medications for heart disease. Reviews the medication, class of the drug, and side effects. (all other drug classes)    Go Sex-Intimacy & Heart Disease, Get SMART -  Goal Setting: - Group verbal and written instruction through game format to discuss heart disease and the return to sexual intimacy. Provides group verbal and written material to discuss and apply goal setting through the application of the S.M.A.R.T. Method.   Other Matters of the Heart: - Provides group verbal, written materials and models to describe Stable Angina and Peripheral Artery. Includes description of the disease process and treatment options available to the cardiac patient.   Exercise & Equipment Safety: - Individual verbal instruction and demonstration of equipment use and safety with use of the equipment.   Cardiac Rehab from 05/29/2018 in [facility] Cardiac and Pulmonary Rehab  Date  05/29/18  Educator  [facility]  Instruction Review Code  1- Verbalizes Understanding      Infection Prevention: - Provides verbal and written material to individual with discussion of infection control including proper hand washing and proper equipment cleaning during exercise session.   Cardiac Rehab from 05/29/2018 in [facility] Cardiac and Pulmonary Rehab  Date  05/29/18  Educator  [facility]  Instruction Review Code  1- Verbalizes Understanding      Falls Prevention: - Provides verbal and written material to individual with discussion of falls prevention and safety.   Cardiac Rehab from 05/29/2018 in [facility] Cardiac and Pulmonary Rehab  Date   05/29/18  Educator  [facility]  Instruction Review Code  1- Verbalizes Understanding      Diabetes: - Individual verbal and written instruction to review signs/symptoms of diabetes, desired ranges of glucose level fasting, after meals and with exercise. Acknowledge that pre and post exercise glucose checks will be done for 3 sessions at entry of program.   Know Your Numbers and Risk Factors: -Group verbal and written instruction about important numbers in your health.  Discussion of what are risk factors and how they play a role in the disease process.  Review of Cholesterol, Blood Pressure, Diabetes, and BMI and the role they play in your overall health.   Sleep Hygiene: -Provides group verbal and written instruction about how sleep can affect your health.  Define sleep hygiene, discuss sleep cycles and impact of sleep habits. Review good sleep hygiene tips.    Other: -Provides group and verbal instruction on various topics (see comments)   Knowledge Questionnaire Score: Knowledge Questionnaire Score - 05/29/18 1518      Knowledge Questionnaire Score   Pre Score  23/26   correct answers reviewed with patient, focus on nutrition and exercise      Core Components/Risk Factors/Patient Goals at Admission: Personal Goals and Risk Factors at Admission - 05/29/18 1512      Core Components/Risk Factors/Patient Goals on Admission    Weight Management  Yes;Weight Loss    Intervention  Weight Management: Develop a combined nutrition and exercise program designed to reach desired caloric intake, while maintaining appropriate intake of nutrient and fiber, sodium and fats, and appropriate energy expenditure required for the weight goal.;Weight Management: Provide education and appropriate resources to help participant work on and attain dietary goals.;Weight Management/Obesity: Establish reasonable short term and long term weight goals.    Admit Weight  163 lb 4.8 oz (74.1 kg)    Goal Weight: Short  Term  158 lb (71.7 kg)    Goal Weight: Long Term  143 lb (64.9 kg)    Expected Outcomes  Short Term: Continue to assess and modify interventions until short term weight is achieved;Long Term: Adherence to nutrition and physical activity/exercise program aimed toward attainment of established weight goal;Weight Loss: Understanding  of general recommendations for a balanced deficit meal plan, which promotes 1-2 lb weight loss per week and includes a negative energy balance of [phone] kcal/d;Understanding recommendations for meals to include 15-35% energy as protein, 25-35% energy from fat, 35-60% energy from carbohydrates, less than '200mg'$  of dietary cholesterol, 20-35 gm of total fiber daily;Understanding of distribution of calorie intake throughout the day with the consumption of 4-5 meals/snacks    Hypertension  Yes    Intervention  Provide education on lifestyle modifcations including regular physical activity/exercise, weight management, moderate sodium restriction and increased consumption of fresh fruit, vegetables, and low fat dairy, alcohol moderation, and smoking cessation.;Monitor prescription use compliance.    Expected Outcomes  Short Term: Continued assessment and intervention until BP is < 140/72m HG in hypertensive participants. < 130/861mHG in hypertensive participants with diabetes, heart failure or chronic kidney disease.;Long Term: Maintenance of blood pressure at goal levels.    Lipids  Yes    Intervention  Provide education and support for participant on nutrition & aerobic/resistive exercise along with prescribed medications to achieve LDL '70mg'$ , HDL >'40mg'$ .    Expected Outcomes  Short Term: Participant states understanding of desired cholesterol values and is compliant with medications prescribed. Participant is following exercise prescription and nutrition guidelines.;Long Term: Cholesterol controlled with medications as prescribed, with individualized exercise RX and with personalized  nutrition plan. Value goals: LDL < '70mg'$ , HDL > 40 mg.       Core Components/Risk Factors/Patient Goals Review:    Core Components/Risk Factors/Patient Goals at Discharge (Final Review):    ITP Comments: ITP Comments    Row Name 05/29/18 1500           ITP Comments  Med Review completed. Initial ITP created. Diagnosis can be found in Care Everywhere 04/25/18          Comments: Initial ITP

## 2018-06-04 ENCOUNTER — Encounter: Payer: Self-pay | Admitting: *Deleted

## 2018-06-04 DIAGNOSIS — Z955 Presence of coronary angioplasty implant and graft: Secondary | ICD-10-CM

## 2018-06-04 DIAGNOSIS — Z9861 Coronary angioplasty status: Secondary | ICD-10-CM

## 2018-06-04 NOTE — Progress Notes (Signed)
Cardiac Individual Treatment Plan  Patient Details  Name: Monica Bush MRN: [id number] Date of Birth: January 24, 1941 Referring Provider:     Cardiac Rehab from 05/29/2018 in [facility] Cardiac and Pulmonary Rehab  Referring Provider  kelsey      Initial Encounter Date:    Cardiac Rehab from 05/29/2018 in [facility] Cardiac and Pulmonary Rehab  Date  05/29/18      Visit Diagnosis: S/P PTCA (percutaneous transluminal coronary angioplasty)  Status post coronary artery stent placement  Patient's Home Medications on Admission:  Current Outpatient Medications:  .  amLODipine (NORVASC) 10 MG tablet, Take 10 mg by mouth daily., Disp: , Rfl:  .  aspirin 81 MG chewable tablet, Chew 81 mg by mouth daily., Disp: , Rfl:  .  atorvastatin (LIPITOR) 40 MG tablet, Take 40 mg by mouth daily., Disp: , Rfl:  .  augmented betamethasone dipropionate (DIPROLENE-AF) 0.05 % ointment, Apply topically., Disp: , Rfl:  .  BRILINTA 90 MG TABS tablet, , Disp: , Rfl:  .  chlorpheniramine (CHLOR-TRIMETON) 4 MG tablet, Take by mouth., Disp: , Rfl:  .  Cholecalciferol (VITAMIN D3) 25 MCG (1000 UT) CAPS, Take by mouth., Disp: , Rfl:  .  fluticasone (FLONASE) 50 MCG/ACT nasal spray, Place into the nose., Disp: , Rfl:  .  metroNIDAZOLE (METROGEL) 1 % gel, Apply topically., Disp: , Rfl:  .  nitroGLYCERIN (NITROSTAT) 0.4 MG SL tablet, , Disp: , Rfl:   Past Medical History: Past Medical History:  Diagnosis Date  . Anxiety   . Asthma   . CAD (coronary artery disease)   . Hyperlipemia   . Hypertension   . Osteopetrosis   . Reflux esophagitis     Tobacco Use: Social History   Tobacco Use  Smoking Status Never Smoker  Smokeless Tobacco Never Used    Labs: Recent Review Flowsheet Data    There is no flowsheet data to display.       Exercise Target Goals: Exercise Program Goal: Individual exercise prescription set using results from initial 6 min walk test and THRR while considering  patient's activity barriers and  safety.   Exercise Prescription Goal: Initial exercise prescription builds to 30-45 minutes a day of aerobic activity, 2-3 days per week.  Home exercise guidelines will be given to patient during program as part of exercise prescription that the participant will acknowledge.  Activity Barriers & Risk Stratification:   6 Minute Walk: 6 Minute Walk    Row Name 05/29/18 1447         6 Minute Walk   Phase  Initial     Distance  1228 feet     Walk Time  6 minutes     # of Rest Breaks  0     MPH  2.33     METS  2.31     RPE  11     VO2 Peak  8.08     Symptoms  No     Resting HR  72 bpm     Resting BP  124/70     Resting Oxygen Saturation   98 %     Exercise Oxygen Saturation  during 6 min walk  99 %     Max Ex. HR  112 bpm     Max Ex. BP  124/66     2 Minute Post BP  122/66        Oxygen Initial Assessment:   Oxygen Re-Evaluation:   Oxygen Discharge (Final Oxygen Re-Evaluation):   Initial Exercise  Prescription: Initial Exercise Prescription - 05/29/18 1400      Date of Initial Exercise RX and Referring Provider   Date  05/29/18    Referring Provider  kelsey      Treadmill   MPH  1.7    Grade  0    Minutes  15    METs  2.3      NuStep   Level  2    SPM  80    Minutes  15    METs  2.3      Biostep-RELP   Level  2    SPM  50    Minutes  15    METs  2      Prescription Details   Frequency (times per week)  3    Duration  Progress to 30 minutes of continuous aerobic without signs/symptoms of physical distress      Intensity   THRR 40-80% of Max Heartrate  101-130    Ratings of Perceived Exertion  11-15    Perceived Dyspnea  0-4      Resistance Training   Training Prescription  Yes    Weight  3 lb    Reps  10-15       Perform Capillary Blood Glucose checks as needed.  Exercise Prescription Changes: Exercise Prescription Changes    Row Name 05/29/18 1400             Response to Exercise   Blood Pressure (Admit)  124/70       Blood  Pressure (Exercise)  124/66       Blood Pressure (Exit)  122/66       Heart Rate (Admit)  72 bpm       Heart Rate (Exercise)  112 bpm       Heart Rate (Exit)  75 bpm       Oxygen Saturation (Admit)  98 %       Oxygen Saturation (Exercise)  99 %       Rating of Perceived Exertion (Exercise)  11          Exercise Comments:   Exercise Goals and Review: Exercise Goals    Row Name 05/29/18 1446             Exercise Goals   Increase Physical Activity  Yes       Intervention  Provide advice, education, support and counseling about physical activity/exercise needs.;Develop an individualized exercise prescription for aerobic and resistive training based on initial evaluation findings, risk stratification, comorbidities and participant's personal goals.       Expected Outcomes  Short Term: Attend rehab on a regular basis to increase amount of physical activity.;Long Term: Add in home exercise to make exercise part of routine and to increase amount of physical activity.;Long Term: Exercising regularly at least 3-5 days a week.       Increase Strength and Stamina  Yes       Intervention  Provide advice, education, support and counseling about physical activity/exercise needs.;Develop an individualized exercise prescription for aerobic and resistive training based on initial evaluation findings, risk stratification, comorbidities and participant's personal goals.       Expected Outcomes  Short Term: Increase workloads from initial exercise prescription for resistance, speed, and METs.;Short Term: Perform resistance training exercises routinely during rehab and add in resistance training at home;Long Term: Improve cardiorespiratory fitness, muscular endurance and strength as measured by increased METs and functional capacity (6MWT)  Able to understand and use rate of perceived exertion (RPE) scale  Yes       Intervention  Provide education and explanation on how to use RPE scale       Expected  Outcomes  Short Term: Able to use RPE daily in rehab to express subjective intensity level;Long Term:  Able to use RPE to guide intensity level when exercising independently       Knowledge and understanding of Target Heart Rate Range (THRR)  Yes       Intervention  Provide education and explanation of THRR including how the numbers were predicted and where they are located for reference       Expected Outcomes  Short Term: Able to use daily as guideline for intensity in rehab;Short Term: Able to state/look up THRR;Long Term: Able to use THRR to govern intensity when exercising independently       Able to check pulse independently  Yes       Intervention  Provide education and demonstration on how to check pulse in carotid and radial arteries.;Review the importance of being able to check your own pulse for safety during independent exercise       Expected Outcomes  Short Term: Able to explain why pulse checking is important during independent exercise;Long Term: Able to check pulse independently and accurately       Understanding of Exercise Prescription  Yes       Intervention  Provide education, explanation, and written materials on patient's individual exercise prescription       Expected Outcomes  Short Term: Able to explain program exercise prescription;Long Term: Able to explain home exercise prescription to exercise independently          Exercise Goals Re-Evaluation :   Discharge Exercise Prescription (Final Exercise Prescription Changes): Exercise Prescription Changes - 05/29/18 1400      Response to Exercise   Blood Pressure (Admit)  124/70    Blood Pressure (Exercise)  124/66    Blood Pressure (Exit)  122/66    Heart Rate (Admit)  72 bpm    Heart Rate (Exercise)  112 bpm    Heart Rate (Exit)  75 bpm    Oxygen Saturation (Admit)  98 %    Oxygen Saturation (Exercise)  99 %    Rating of Perceived Exertion (Exercise)  11       Nutrition:  Target Goals: Understanding of  nutrition guidelines, daily intake of sodium '1500mg'$ , cholesterol '200mg'$ , calories 30% from fat and 7% or less from saturated fats, daily to have 5 or more servings of fruits and vegetables.  Biometrics: Pre Biometrics - 05/29/18 1446      Pre Biometrics   Height  5' 1.5" (1.562 m)    Weight  163 lb 4.8 oz (74.1 kg)    Waist Circumference  35 inches    Hip Circumference  43 inches    Waist to Hip Ratio  0.81 %    BMI (Calculated)  30.36    Single Leg Stand  10.68 seconds        Nutrition Therapy Plan and Nutrition Goals: Nutrition Therapy & Goals - 05/29/18 1522      Intervention Plan   Intervention  Prescribe, educate and counsel regarding individualized specific dietary modifications aiming towards targeted core components such as weight, hypertension, lipid management, diabetes, heart failure and other comorbidities.;Nutrition handout(s) given to patient.    Expected Outcomes  Short Term Goal: Understand basic principles of dietary content, such as calories,  fat, sodium, cholesterol and nutrients.;Long Term Goal: Adherence to prescribed nutrition plan.;Short Term Goal: A plan has been developed with personal nutrition goals set during dietitian appointment.       Nutrition Assessments: Nutrition Assessments - 05/29/18 1522      MEDFICTS Scores   Pre Score  18       Nutrition Goals Re-Evaluation:   Nutrition Goals Discharge (Final Nutrition Goals Re-Evaluation):   Psychosocial: Target Goals: Acknowledge presence or absence of significant depression and/or stress, maximize coping skills, provide positive support system. Participant is able to verbalize types and ability to use techniques and skills needed for reducing stress and depression.   Initial Review & Psychosocial Screening: Initial Psych Review & Screening - 05/29/18 1520      Initial Review   Current issues with  Current Stress Concerns    Source of Stress Concerns  Unable to perform yard/household  activities      [place]?  Yes   friends     Barriers   Psychosocial barriers to participate in program  There are no identifiable barriers or psychosocial needs.;The patient should benefit from training in stress management and relaxation.      Screening Interventions   Interventions  Encouraged to exercise;Program counselor consult;Provide feedback about the scores to participant;To provide support and resources with identified psychosocial needs    Expected Outcomes  Short Term goal: Utilizing psychosocial counselor, staff and physician to assist with identification of specific Stressors or current issues interfering with healing process. Setting desired goal for each stressor or current issue identified.;Long Term Goal: Stressors or current issues are controlled or eliminated.;Short Term goal: Identification and review with participant of any Quality of Life or Depression concerns found by scoring the questionnaire.;Long Term goal: The participant improves quality of Life and PHQ9 Scores as seen by post scores and/or verbalization of changes       Quality of Life Scores:  Quality of Life - 05/29/18 1522      Quality of Life   Select  Quality of Life      Quality of Life Scores   Health/Function Pre  24.43 %    Socioeconomic Pre  27.92 %    Psych/Spiritual Pre  18 %    Family Pre  24 %    GLOBAL Pre  23.55 %      Scores of 19 and below usually indicate a poorer quality of life in these areas.  A difference of  2-3 points is a clinically meaningful difference.  A difference of 2-3 points in the total score of the Quality of Life Index has been associated with significant improvement in overall quality of life, self-image, physical symptoms, and general health in studies assessing change in quality of life.  PHQ-9: Recent Review Flowsheet Data    Depression screen [facility] 2/9 05/29/2018   Decreased Interest 0   Down, Depressed, Hopeless 0   PHQ - 2 Score 0    Altered sleeping 0   Tired, decreased energy 1   Change in appetite 1   Feeling bad or failure about yourself  1   Trouble concentrating 0   Moving slowly or fidgety/restless 0   Suicidal thoughts 0   PHQ-9 Score 3   Difficult doing work/chores Somewhat difficult     Interpretation of Total Score  Total Score Depression Severity:  1-4 = Minimal depression, 5-9 = Mild depression, 10-14 = Moderate depression, 15-19 = Moderately severe depression, 20-27 = Severe  depression   Psychosocial Evaluation and Intervention:   Psychosocial Re-Evaluation:   Psychosocial Discharge (Final Psychosocial Re-Evaluation):   Vocational Rehabilitation: Provide vocational rehab assistance to qualifying candidates.   Vocational Rehab Evaluation & Intervention: Vocational Rehab - 05/29/18 1520      Initial Vocational Rehab Evaluation & Intervention   Assessment shows need for Vocational Rehabilitation  No       Education: Education Goals: Education classes will be provided on a variety of topics geared toward better understanding of heart health and risk factor modification. Participant will state understanding/return demonstration of topics presented as noted by education test scores.  Learning Barriers/Preferences: Learning Barriers/Preferences - 05/29/18 1518      Learning Barriers/Preferences   Learning Barriers  None    Learning Preferences  Individual Instruction       Education Topics:  AED/CPR: - Group verbal and written instruction with the use of models to demonstrate the basic use of the AED with the basic ABC's of resuscitation.   General Nutrition Guidelines/Fats and Fiber: -Group instruction provided by verbal, written material, models and posters to present the general guidelines for heart healthy nutrition. Gives an explanation and review of dietary fats and fiber.   Controlling Sodium/Reading Food Labels: -Group verbal and written material supporting the  discussion of sodium use in heart healthy nutrition. Review and explanation with models, verbal and written materials for utilization of the food label.   Exercise Physiology & General Exercise Guidelines: - Group verbal and written instruction with models to review the exercise physiology of the cardiovascular system and associated critical values. Provides general exercise guidelines with specific guidelines to those with heart or lung disease.    Aerobic Exercise & Resistance Training: - Gives group verbal and written instruction on the various components of exercise. Focuses on aerobic and resistive training programs and the benefits of this training and how to safely progress through these programs..   Flexibility, Balance, Mind/Body Relaxation: Provides group verbal/written instruction on the benefits of flexibility and balance training, including mind/body exercise modes such as yoga, pilates and tai chi.  Demonstration and skill practice provided.   Stress and Anxiety: - Provides group verbal and written instruction about the health risks of elevated stress and causes of high stress.  Discuss the correlation between heart/lung disease and anxiety and treatment options. Review healthy ways to manage with stress and anxiety.   Depression: - Provides group verbal and written instruction on the correlation between heart/lung disease and depressed mood, treatment options, and the stigmas associated with seeking treatment.   Anatomy & Physiology of the Heart: - Group verbal and written instruction and models provide basic cardiac anatomy and physiology, with the coronary electrical and arterial systems. Review of Valvular disease and Heart Failure   Cardiac Procedures: - Group verbal and written instruction to review commonly prescribed medications for heart disease. Reviews the medication, class of the drug, and side effects. Includes the steps to properly store meds and maintain the  prescription regimen. (beta blockers and nitrates)   Cardiac Medications I: - Group verbal and written instruction to review commonly prescribed medications for heart disease. Reviews the medication, class of the drug, and side effects. Includes the steps to properly store meds and maintain the prescription regimen.   Cardiac Medications II: -Group verbal and written instruction to review commonly prescribed medications for heart disease. Reviews the medication, class of the drug, and side effects. (all other drug classes)    Go Sex-Intimacy & Heart Disease, Get SMART -  Goal Setting: - Group verbal and written instruction through game format to discuss heart disease and the return to sexual intimacy. Provides group verbal and written material to discuss and apply goal setting through the application of the S.M.A.R.T. Method.   Other Matters of the Heart: - Provides group verbal, written materials and models to describe Stable Angina and Peripheral Artery. Includes description of the disease process and treatment options available to the cardiac patient.   Exercise & Equipment Safety: - Individual verbal instruction and demonstration of equipment use and safety with use of the equipment.   Cardiac Rehab from 05/29/2018 in [facility] Cardiac and Pulmonary Rehab  Date  05/29/18  Educator  [facility]  Instruction Review Code  1- Verbalizes Understanding      Infection Prevention: - Provides verbal and written material to individual with discussion of infection control including proper hand washing and proper equipment cleaning during exercise session.   Cardiac Rehab from 05/29/2018 in [facility] Cardiac and Pulmonary Rehab  Date  05/29/18  Educator  [facility]  Instruction Review Code  1- Verbalizes Understanding      Falls Prevention: - Provides verbal and written material to individual with discussion of falls prevention and safety.   Cardiac Rehab from 05/29/2018 in [facility] Cardiac and Pulmonary Rehab  Date   05/29/18  Educator  [facility]  Instruction Review Code  1- Verbalizes Understanding      Diabetes: - Individual verbal and written instruction to review signs/symptoms of diabetes, desired ranges of glucose level fasting, after meals and with exercise. Acknowledge that pre and post exercise glucose checks will be done for 3 sessions at entry of program.   Know Your Numbers and Risk Factors: -Group verbal and written instruction about important numbers in your health.  Discussion of what are risk factors and how they play a role in the disease process.  Review of Cholesterol, Blood Pressure, Diabetes, and BMI and the role they play in your overall health.   Sleep Hygiene: -Provides group verbal and written instruction about how sleep can affect your health.  Define sleep hygiene, discuss sleep cycles and impact of sleep habits. Review good sleep hygiene tips.    Other: -Provides group and verbal instruction on various topics (see comments)   Knowledge Questionnaire Score: Knowledge Questionnaire Score - 05/29/18 1518      Knowledge Questionnaire Score   Pre Score  23/26   correct answers reviewed with patient, focus on nutrition and exercise      Core Components/Risk Factors/Patient Goals at Admission: Personal Goals and Risk Factors at Admission - 05/29/18 1512      Core Components/Risk Factors/Patient Goals on Admission    Weight Management  Yes;Weight Loss    Intervention  Weight Management: Develop a combined nutrition and exercise program designed to reach desired caloric intake, while maintaining appropriate intake of nutrient and fiber, sodium and fats, and appropriate energy expenditure required for the weight goal.;Weight Management: Provide education and appropriate resources to help participant work on and attain dietary goals.;Weight Management/Obesity: Establish reasonable short term and long term weight goals.    Admit Weight  163 lb 4.8 oz (74.1 kg)    Goal Weight: Short  Term  158 lb (71.7 kg)    Goal Weight: Long Term  143 lb (64.9 kg)    Expected Outcomes  Short Term: Continue to assess and modify interventions until short term weight is achieved;Long Term: Adherence to nutrition and physical activity/exercise program aimed toward attainment of established weight goal;Weight Loss: Understanding  of general recommendations for a balanced deficit meal plan, which promotes 1-2 lb weight loss per week and includes a negative energy balance of [phone] kcal/d;Understanding recommendations for meals to include 15-35% energy as protein, 25-35% energy from fat, 35-60% energy from carbohydrates, less than '200mg'$  of dietary cholesterol, 20-35 gm of total fiber daily;Understanding of distribution of calorie intake throughout the day with the consumption of 4-5 meals/snacks    Hypertension  Yes    Intervention  Provide education on lifestyle modifcations including regular physical activity/exercise, weight management, moderate sodium restriction and increased consumption of fresh fruit, vegetables, and low fat dairy, alcohol moderation, and smoking cessation.;Monitor prescription use compliance.    Expected Outcomes  Short Term: Continued assessment and intervention until BP is < 140/97m HG in hypertensive participants. < 130/883mHG in hypertensive participants with diabetes, heart failure or chronic kidney disease.;Long Term: Maintenance of blood pressure at goal levels.    Lipids  Yes    Intervention  Provide education and support for participant on nutrition & aerobic/resistive exercise along with prescribed medications to achieve LDL '70mg'$ , HDL >'40mg'$ .    Expected Outcomes  Short Term: Participant states understanding of desired cholesterol values and is compliant with medications prescribed. Participant is following exercise prescription and nutrition guidelines.;Long Term: Cholesterol controlled with medications as prescribed, with individualized exercise RX and with personalized  nutrition plan. Value goals: LDL < '70mg'$ , HDL > 40 mg.       Core Components/Risk Factors/Patient Goals Review:    Core Components/Risk Factors/Patient Goals at Discharge (Final Review):    ITP Comments: ITP Comments    Row Name 05/29/18 1500 06/04/18 1351         ITP Comments  Med Review completed. Initial ITP created. Diagnosis can be found in Care Everywhere 04/25/18  30 day review completed. ITP sent to Dr. MaEmily FilbertMedical Director of Cardiac Rehab. Continue with ITP unless changes are made by physician.         Comments: 30 day review

## 2018-06-05 ENCOUNTER — Encounter: Payer: Medicare Other | Admitting: *Deleted

## 2018-06-05 DIAGNOSIS — Z9861 Coronary angioplasty status: Secondary | ICD-10-CM | POA: Diagnosis not present

## 2018-06-05 DIAGNOSIS — Z955 Presence of coronary angioplasty implant and graft: Secondary | ICD-10-CM

## 2018-06-05 NOTE — Progress Notes (Signed)
Daily Session Note  Patient Details  Name: Monica Bush MRN: [id number] Date of Birth: 06/14/1940 Referring Provider:     Cardiac Rehab from 05/29/2018 in [facility] Cardiac and Pulmonary Rehab  Referring Provider  kelsey      Encounter Date: 06/05/2018  Check In: Session Check In - 06/05/18 7017      Check-In   Supervising physician immediately available to respond to emergencies  See telemetry face sheet for immediately available ER MD    Location  [facility]-Cardiac & Pulmonary Rehab    Staff Present  Jasper Loser BS, Exercise Physiologist;Carroll Enterkin, RN, BSN;Welma Mccombs Luan Pulling, MA, RCEP, CCRP, Exercise Physiologist    Medication changes reported      No    Fall or balance concerns reported     No    Warm-up and Cool-down  Performed as group-led instruction    Resistance Training Performed  Yes    VAD Patient?  No    PAD/SET Patient?  No      Pain Assessment   Currently in Pain?  No/denies          Social History   Tobacco Use  Smoking Status Never Smoker  Smokeless Tobacco Never Used    Goals Met:  Exercise tolerated well Personal goals reviewed No report of cardiac concerns or symptoms Strength training completed today  Goals Unmet:  Not Applicable  Comments: First full day of exercise!  Patient was oriented to gym and equipment including functions, settings, policies, and procedures.  Patient's individual exercise prescription and treatment plan were reviewed.  All starting workloads were established based on the results of the 6 minute walk test done at initial orientation visit.  The plan for exercise progression was also introduced and progression will be customized based on patient's performance and goals.    Dr. Emily Filbert is Medical Director for [place] [facility].

## 2018-06-10 ENCOUNTER — Encounter: Payer: Medicare Other | Attending: Cardiology

## 2018-06-10 DIAGNOSIS — Z9861 Coronary angioplasty status: Secondary | ICD-10-CM | POA: Insufficient documentation

## 2018-06-10 DIAGNOSIS — Z955 Presence of coronary angioplasty implant and graft: Secondary | ICD-10-CM | POA: Diagnosis not present

## 2018-06-10 NOTE — Progress Notes (Signed)
Daily Session Note  Patient Details  Name: Monica Bush MRN: [id number] Date of Birth: Sep 11, 1940 Referring Provider:     Cardiac Rehab from 05/29/2018 in [facility] Cardiac and Pulmonary Rehab  Referring Provider  kelsey      Encounter Date: 06/10/2018  Check In: Session Check In - 06/10/18 0925      Check-In   Supervising physician immediately available to respond to emergencies  See telemetry face sheet for immediately available ER MD    Location  [facility]-Cardiac & Pulmonary Rehab    Staff Present  Heath Lark, RN, BSN, CCRP;Judianne Seiple BS, Exercise Physiologist;Jessica [place], MA, RCEP, CCRP, Exercise Physiologist    Medication changes reported      No    Fall or balance concerns reported     No    Warm-up and Cool-down  Performed as group-led Higher education careers adviser Performed  Yes    VAD Patient?  No    PAD/SET Patient?  No      Pain Assessment   Currently in Pain?  No/denies    Multiple Pain Sites  No          Social History   Tobacco Use  Smoking Status Never Smoker  Smokeless Tobacco Never Used    Goals Met:  Independence with exercise equipment Exercise tolerated well No report of cardiac concerns or symptoms Strength training completed today  Goals Unmet:  Not Applicable  Comments: Pt able to follow exercise prescription today without complaint.  Will continue to monitor for progression.    Dr. Emily Filbert is Medical Director for [place] and [facility].

## 2018-06-12 DIAGNOSIS — Z955 Presence of coronary angioplasty implant and graft: Secondary | ICD-10-CM

## 2018-06-12 DIAGNOSIS — Z9861 Coronary angioplasty status: Secondary | ICD-10-CM

## 2018-06-12 NOTE — Progress Notes (Signed)
Daily Session Note  Patient Details  Name: Monica Bush MRN: [id number] Date of Birth: 13-Nov-1940 Referring Provider:     Cardiac Rehab from 05/29/2018 in [facility] Cardiac and Pulmonary Rehab  Referring Provider  kelsey      Encounter Date: 06/12/2018  Check In: Session Check In - 06/12/18 0917      Check-In   Supervising physician immediately available to respond to emergencies  See telemetry face sheet for immediately available ER MD    Location  [facility]-Cardiac & Pulmonary Rehab    Staff Present  Gerlene Burdock, RN, BSN;Jeanna Durrell BS, Exercise Physiologist;Jessica [place], MA, RCEP, CCRP, Exercise Physiologist    Medication changes reported      No    Fall or balance concerns reported     No    Tobacco Cessation  No Change    Warm-up and Cool-down  Performed as group-led instruction    Resistance Training Performed  Yes    VAD Patient?  No    PAD/SET Patient?  No      Pain Assessment   Currently in Pain?  No/denies    Multiple Pain Sites  No          Social History   Tobacco Use  Smoking Status Never Smoker  Smokeless Tobacco Never Used    Goals Met:  Independence with exercise equipment Exercise tolerated well No report of cardiac concerns or symptoms Strength training completed today  Goals Unmet:  Not Applicable  Comments: Pt able to follow exercise prescription today without complaint.  Will continue to monitor for progression.    Dr. Emily Filbert is Medical Director for [place] and [facility].

## 2018-06-19 DIAGNOSIS — Z9861 Coronary angioplasty status: Secondary | ICD-10-CM | POA: Diagnosis not present

## 2018-06-19 DIAGNOSIS — Z955 Presence of coronary angioplasty implant and graft: Secondary | ICD-10-CM

## 2018-06-19 NOTE — Progress Notes (Signed)
Daily Session Note  Patient Details  Name: Monica Bush MRN: [id number] Date of Birth: 09/05/1940 Referring Provider:     Cardiac Rehab from 05/29/2018 in [facility] Cardiac and Pulmonary Rehab  Referring Provider  kelsey      Encounter Date: 06/19/2018  Check In: Session Check In - 06/19/18 0924      Check-In   Supervising physician immediately available to respond to emergencies  See telemetry face sheet for immediately available ER MD    Location  [facility]-Cardiac & Pulmonary Rehab    Staff Present  Carroll Enterkin, RN, BSN;Jeanna Durrell BS, Exercise Physiologist;Jessica [place], MA, RCEP, CCRP, Exercise Physiologist    Medication changes reported      No    Fall or balance concerns reported     No    Tobacco Cessation  No Change    Warm-up and Cool-down  Performed as group-led instruction    Resistance Training Performed  Yes    VAD Patient?  No    PAD/SET Patient?  No      Pain Assessment   Currently in Pain?  No/denies    Multiple Pain Sites  No          Social History   Tobacco Use  Smoking Status Never Smoker  Smokeless Tobacco Never Used    Goals Met:  Independence with exercise equipment Exercise tolerated well Personal goals reviewed No report of cardiac concerns or symptoms Strength training completed today  Goals Unmet:  Not Applicable  Comments: Pt able to follow exercise prescription today without complaint.  Will continue to monitor for progression.  Reviewed home exercise with pt today.  Pt plans to walking and going to [employer] for exercise.  Reviewed THR, pulse, RPE, sign and symptoms, NTG use, and when to call 911 or MD.  Also discussed weather considerations and indoor options.  Pt voiced understanding.    Dr. Mark Miller is Medical Director for [facility] and [facility]. 

## 2018-06-24 DIAGNOSIS — Z9861 Coronary angioplasty status: Secondary | ICD-10-CM | POA: Diagnosis not present

## 2018-06-24 DIAGNOSIS — Z955 Presence of coronary angioplasty implant and graft: Secondary | ICD-10-CM

## 2018-06-24 NOTE — Progress Notes (Signed)
Daily Session Note  Patient Details  Name: Monica Bush MRN: [id number] Date of Birth: [birth year] Referring Provider:     Cardiac Rehab from 05/29/2018 in [facility] Cardiac and Pulmonary Rehab  Referring Provider  kelsey      Encounter Date: 06/24/2018  Check In: Session Check In - 06/24/18 1107      Check-In   Supervising physician immediately available to respond to emergencies  See telemetry face sheet for immediately available ER MD    Location  [facility]-Cardiac & Pulmonary Rehab    Staff Present  Heath Lark, RN, BSN, CCRP;Jeanna Durrell BS, Exercise Physiologist;Amanda Sommer, BA, ACSM CEP, Exercise Physiologist    Medication changes reported      No    Fall or balance concerns reported     No    Tobacco Cessation  No Change    Warm-up and Cool-down  Performed as group-led instruction    Resistance Training Performed  Yes    VAD Patient?  No    PAD/SET Patient?  No      Pain Assessment   Currently in Pain?  No/denies    Multiple Pain Sites  No          Social History   Tobacco Use  Smoking Status Never Smoker  Smokeless Tobacco Never Used    Goals Met:  Independence with exercise equipment Exercise tolerated well No report of cardiac concerns or symptoms Strength training completed today  Goals Unmet:  Not Applicable  Comments: Pt able to follow exercise prescription today without complaint.  Will continue to monitor for progression.    Dr. Emily Filbert is Medical Director for [place] and [facility].

## 2018-06-26 DIAGNOSIS — Z9861 Coronary angioplasty status: Secondary | ICD-10-CM | POA: Diagnosis not present

## 2018-06-26 DIAGNOSIS — Z955 Presence of coronary angioplasty implant and graft: Secondary | ICD-10-CM

## 2018-06-26 NOTE — Progress Notes (Signed)
Daily Session Note  Patient Details  Name: Monica Bush MRN: [id number] Date of Birth: 06-14-1940 Referring Provider:     Cardiac Rehab from 05/29/2018 in [facility] Cardiac and Pulmonary Rehab  Referring Provider  kelsey      Encounter Date: 06/26/2018  Check In: Session Check In - 06/26/18 0912      Check-In   Supervising physician immediately available to respond to emergencies  See telemetry face sheet for immediately available ER MD    Location  [facility]-Cardiac & Pulmonary Rehab    Staff Present  Gerlene Burdock, RN, BSN;Regina Ganci BS, Exercise Physiologist;Jessica [place], MA, RCEP, CCRP, Exercise Physiologist    Medication changes reported      No    Fall or balance concerns reported     No    Tobacco Cessation  No Change    Warm-up and Cool-down  Performed as group-led instruction    Resistance Training Performed  Yes    VAD Patient?  No    PAD/SET Patient?  No      Pain Assessment   Currently in Pain?  No/denies          Social History   Tobacco Use  Smoking Status Never Smoker  Smokeless Tobacco Never Used    Goals Met:  Independence with exercise equipment Exercise tolerated well No report of cardiac concerns or symptoms Strength training completed today  Goals Unmet:  Not Applicable  Comments: Pt able to follow exercise prescription today without complaint.  Will continue to monitor for progression.    Dr. Emily Filbert is Medical Director for [place] and [facility].

## 2018-07-02 ENCOUNTER — Encounter: Payer: Self-pay | Admitting: *Deleted

## 2018-07-02 DIAGNOSIS — Z955 Presence of coronary angioplasty implant and graft: Secondary | ICD-10-CM

## 2018-07-02 DIAGNOSIS — Z9861 Coronary angioplasty status: Secondary | ICD-10-CM

## 2018-07-02 NOTE — Progress Notes (Signed)
Cardiac Individual Treatment Plan  Patient Details  Name: Monica Bush MRN: [id number] Date of Birth: January 24, 1941 Referring Provider:     Cardiac Rehab from 05/29/2018 in [facility] Cardiac and Pulmonary Rehab  Referring Provider  kelsey      Initial Encounter Date:    Cardiac Rehab from 05/29/2018 in [facility] Cardiac and Pulmonary Rehab  Date  05/29/18      Visit Diagnosis: S/P PTCA (percutaneous transluminal coronary angioplasty)  Status post coronary artery stent placement  Patient's Home Medications on Admission:  Current Outpatient Medications:  .  amLODipine (NORVASC) 10 MG tablet, Take 10 mg by mouth daily., Disp: , Rfl:  .  aspirin 81 MG chewable tablet, Chew 81 mg by mouth daily., Disp: , Rfl:  .  atorvastatin (LIPITOR) 40 MG tablet, Take 40 mg by mouth daily., Disp: , Rfl:  .  augmented betamethasone dipropionate (DIPROLENE-AF) 0.05 % ointment, Apply topically., Disp: , Rfl:  .  BRILINTA 90 MG TABS tablet, , Disp: , Rfl:  .  chlorpheniramine (CHLOR-TRIMETON) 4 MG tablet, Take by mouth., Disp: , Rfl:  .  Cholecalciferol (VITAMIN D3) 25 MCG (1000 UT) CAPS, Take by mouth., Disp: , Rfl:  .  fluticasone (FLONASE) 50 MCG/ACT nasal spray, Place into the nose., Disp: , Rfl:  .  metroNIDAZOLE (METROGEL) 1 % gel, Apply topically., Disp: , Rfl:  .  nitroGLYCERIN (NITROSTAT) 0.4 MG SL tablet, , Disp: , Rfl:   Past Medical History: Past Medical History:  Diagnosis Date  . Anxiety   . Asthma   . CAD (coronary artery disease)   . Hyperlipemia   . Hypertension   . Osteopetrosis   . Reflux esophagitis     Tobacco Use: Social History   Tobacco Use  Smoking Status Never Smoker  Smokeless Tobacco Never Used    Labs: Recent Review Flowsheet Data    There is no flowsheet data to display.       Exercise Target Goals: Exercise Program Goal: Individual exercise prescription set using results from initial 6 min walk test and THRR while considering  patient's activity barriers and  safety.   Exercise Prescription Goal: Initial exercise prescription builds to 30-45 minutes a day of aerobic activity, 2-3 days per week.  Home exercise guidelines will be given to patient during program as part of exercise prescription that the participant will acknowledge.  Activity Barriers & Risk Stratification:   6 Minute Walk: 6 Minute Walk    Row Name 05/29/18 1447         6 Minute Walk   Phase  Initial     Distance  1228 feet     Walk Time  6 minutes     # of Rest Breaks  0     MPH  2.33     METS  2.31     RPE  11     VO2 Peak  8.08     Symptoms  No     Resting HR  72 bpm     Resting BP  124/70     Resting Oxygen Saturation   98 %     Exercise Oxygen Saturation  during 6 min walk  99 %     Max Ex. HR  112 bpm     Max Ex. BP  124/66     2 Minute Post BP  122/66        Oxygen Initial Assessment:   Oxygen Re-Evaluation:   Oxygen Discharge (Final Oxygen Re-Evaluation):   Initial Exercise  Prescription: Initial Exercise Prescription - 05/29/18 1400      Date of Initial Exercise RX and Referring Provider   Date  05/29/18    Referring Provider  kelsey      Treadmill   MPH  1.7    Grade  0    Minutes  15    METs  2.3      NuStep   Level  2    SPM  80    Minutes  15    METs  2.3      Biostep-RELP   Level  2    SPM  50    Minutes  15    METs  2      Prescription Details   Frequency (times per week)  3    Duration  Progress to 30 minutes of continuous aerobic without signs/symptoms of physical distress      Intensity   THRR 40-80% of Max Heartrate  101-130    Ratings of Perceived Exertion  11-15    Perceived Dyspnea  0-4      Resistance Training   Training Prescription  Yes    Weight  3 lb    Reps  10-15       Perform Capillary Blood Glucose checks as needed.  Exercise Prescription Changes: Exercise Prescription Changes    Row Name 05/29/18 1400 06/10/18 1400 06/25/18 1600         Response to Exercise   Blood Pressure (Admit)   124/70  130/62  126/64     Blood Pressure (Exercise)  124/66  138/74  144/64     Blood Pressure (Exit)  122/66  124/62  118/66     Heart Rate (Admit)  72 bpm  75 bpm  82 bpm     Heart Rate (Exercise)  112 bpm  118 bpm  122 bpm     Heart Rate (Exit)  75 bpm  83 bpm  90 bpm     Oxygen Saturation (Admit)  98 %  -  -     Oxygen Saturation (Exercise)  99 %  -  -     Rating of Perceived Exertion (Exercise)  '11  11  11     '$ Symptoms  -  none  none     Comments  -  second full day of exercise  -     Duration  -  Continue with 30 min of aerobic exercise without signs/symptoms of physical distress.  Continue with 30 min of aerobic exercise without signs/symptoms of physical distress.     Intensity  -  THRR unchanged  THRR unchanged       Progression   Progression  -  Continue to progress workloads to maintain intensity without signs/symptoms of physical distress.  Continue to progress workloads to maintain intensity without signs/symptoms of physical distress.     Average METs  -  2.21  3.26       Resistance Training   Training Prescription  -  Yes  Yes     Weight  -  3 lbs  3 lbs     Reps  -  10-15  10-15       Interval Training   Interval Training  -  No  No       Treadmill   MPH  -  1.7  2.3     Grade  -  0  1     Minutes  -  15  15  METs  -  2.3  3.08       NuStep   Level  -  2  2     Minutes  -  15  15     METs  -  2.1  2.7       Biostep-RELP   Level  -  2  3     Minutes  -  15  15     METs  -  2  4       Home Exercise Plan   Plans to continue exercise at  -  -  [employer] (comment) walking and [employer]     Frequency  -  -  Add 2 additional days to program exercise sessions.     Initial Home Exercises Provided  -  -  06/19/18        Exercise Comments: Exercise Comments    Row Name 06/05/18 [phone]           Exercise Comments  First full day of exercise!  Patient was oriented to gym and equipment including functions, settings, policies, and procedures.   Patient's individual exercise prescription and treatment plan were reviewed.  All starting workloads were established based on the results of the 6 minute walk test done at initial orientation visit.  The plan for exercise progression was also introduced and progression will be customized based on patient's performance and goals.          Exercise Goals and Review: Exercise Goals    Row Name 05/29/18 1446             Exercise Goals   Increase Physical Activity  Yes       Intervention  Provide advice, education, support and counseling about physical activity/exercise needs.;Develop an individualized exercise prescription for aerobic and resistive training based on initial evaluation findings, risk stratification, comorbidities and participant's personal goals.       Expected Outcomes  Short Term: Attend rehab on a regular basis to increase amount of physical activity.;Long Term: Add in home exercise to make exercise part of routine and to increase amount of physical activity.;Long Term: Exercising regularly at least 3-5 days a week.       Increase Strength and Stamina  Yes       Intervention  Provide advice, education, support and counseling about physical activity/exercise needs.;Develop an individualized exercise prescription for aerobic and resistive training based on initial evaluation findings, risk stratification, comorbidities and participant's personal goals.       Expected Outcomes  Short Term: Increase workloads from initial exercise prescription for resistance, speed, and METs.;Short Term: Perform resistance training exercises routinely during rehab and add in resistance training at home;Long Term: Improve cardiorespiratory fitness, muscular endurance and strength as measured by increased METs and functional capacity (6MWT)       Able to understand and use rate of perceived exertion (RPE) scale  Yes       Intervention  Provide education and explanation on how to use RPE scale        Expected Outcomes  Short Term: Able to use RPE daily in rehab to express subjective intensity level;Long Term:  Able to use RPE to guide intensity level when exercising independently       Knowledge and understanding of Target Heart Rate Range (THRR)  Yes       Intervention  Provide education and explanation of THRR including how the numbers were predicted and where they are located for reference  Expected Outcomes  Short Term: Able to use daily as guideline for intensity in rehab;Short Term: Able to state/look up THRR;Long Term: Able to use THRR to govern intensity when exercising independently       Able to check pulse independently  Yes       Intervention  Provide education and demonstration on how to check pulse in carotid and radial arteries.;Review the importance of being able to check your own pulse for safety during independent exercise       Expected Outcomes  Short Term: Able to explain why pulse checking is important during independent exercise;Long Term: Able to check pulse independently and accurately       Understanding of Exercise Prescription  Yes       Intervention  Provide education, explanation, and written materials on patient's individual exercise prescription       Expected Outcomes  Short Term: Able to explain program exercise prescription;Long Term: Able to explain home exercise prescription to exercise independently          Exercise Goals Re-Evaluation : Exercise Goals Re-Evaluation    Row Name 06/05/18 6283 06/10/18 1429 06/19/18 0955 06/25/18 1602       Exercise Goal Re-Evaluation   Exercise Goals Review  Increase Physical Activity;Increase Strength and Stamina;Able to understand and use rate of perceived exertion (RPE) scale;Knowledge and understanding of Target Heart Rate Range (THRR);Understanding of Exercise Prescription  Increase Physical Activity;Increase Strength and Stamina;Understanding of Exercise Prescription  Increase Physical Activity;Increase Strength  and Stamina;Understanding of Exercise Prescription;Able to understand and use rate of perceived exertion (RPE) scale;Knowledge and understanding of Target Heart Rate Range (THRR);Able to check pulse independently  Increase Physical Activity;Increase Strength and Stamina;Understanding of Exercise Prescription    Comments  Reviewed RPE scale, THR and program prescription with pt today.  Pt voiced understanding and was given a copy of goals to take home.   Ridhi is off to a good start in rehab.  She has completed her first two full days of exercise.  She was wanting to get on her third piece today!!  We will start to increase her workloads and montior her progress.   Shakeyla is doing well in rehab.  She already feels like her strength and stamina are coming back.  She is already going to gym [employer]) for 30 min on the treadmill.  Reviewed home exercise with pt today.  Pt plans to walking and going to [employer] for exercise.  Reviewed THR, pulse, RPE, sign and symptoms, NTG use, and when to call 911 or MD.  Also discussed weather considerations and indoor options.  Pt voiced understanding.  Maicy is doing well in rehab.  She is up to 2.3 mph on the treadmill.  We will continue to monitor her progress.     Expected Outcomes  Short: Use RPE daily to regulate intensity. Long: Follow program prescription in THR.  Short: Increase workloads.  Long; Continue to follow program prescription.   Short: Start to add in more exercise at home.  Long: Continue to increase strength and stamina.   Short: Increase workloads.  Long: Continue to increase strength and stamina.        Discharge Exercise Prescription (Final Exercise Prescription Changes): Exercise Prescription Changes - 06/25/18 1600      Response to Exercise   Blood Pressure (Admit)  126/64    Blood Pressure (Exercise)  144/64    Blood Pressure (Exit)  118/66    Heart Rate (Admit)  82 bpm  Heart Rate (Exercise)  122 bpm    Heart Rate (Exit)  90  bpm    Rating of Perceived Exertion (Exercise)  11    Symptoms  none    Duration  Continue with 30 min of aerobic exercise without signs/symptoms of physical distress.    Intensity  THRR unchanged      Progression   Progression  Continue to progress workloads to maintain intensity without signs/symptoms of physical distress.    Average METs  3.26      Resistance Training   Training Prescription  Yes    Weight  3 lbs    Reps  10-15      Interval Training   Interval Training  No      Treadmill   MPH  2.3    Grade  1    Minutes  15    METs  3.08      NuStep   Level  2    Minutes  15    METs  2.7      Biostep-RELP   Level  3    Minutes  15    METs  4      Home Exercise Plan   Plans to continue exercise at  [employer] (comment)   walking and [employer]   Frequency  Add 2 additional days to program exercise sessions.    Initial Home Exercises Provided  06/19/18       Nutrition:  Target Goals: Understanding of nutrition guidelines, daily intake of sodium '1500mg'$ , cholesterol '200mg'$ , calories 30% from fat and 7% or less from saturated fats, daily to have 5 or more servings of fruits and vegetables.  Biometrics: Pre Biometrics - 05/29/18 1446      Pre Biometrics   Height  5' 1.5" (1.562 m)    Weight  163 lb 4.8 oz (74.1 kg)    Waist Circumference  35 inches    Hip Circumference  43 inches    Waist to Hip Ratio  0.81 %    BMI (Calculated)  30.36    Single Leg Stand  10.68 seconds        Nutrition Therapy Plan and Nutrition Goals: Nutrition Therapy & Goals - 05/29/18 1522      Intervention Plan   Intervention  Prescribe, educate and counsel regarding individualized specific dietary modifications aiming towards targeted core components such as weight, hypertension, lipid management, diabetes, heart failure and other comorbidities.;Nutrition handout(s) given to patient.    Expected Outcomes  Short Term Goal: Understand basic principles of dietary  content, such as calories, fat, sodium, cholesterol and nutrients.;Long Term Goal: Adherence to prescribed nutrition plan.;Short Term Goal: A plan has been developed with personal nutrition goals set during dietitian appointment.       Nutrition Assessments: Nutrition Assessments - 05/29/18 1522      MEDFICTS Scores   Pre Score  18       Nutrition Goals Re-Evaluation: Nutrition Goals Re-Evaluation    [place] Name 06/19/18 1014             Goals   Nutrition Goal  Heart healthy, more fruits and vegetables, read food labels, low salt diet.        Comment  Evaluna has been reading food labels and watching her diet.  She has started to limit her soduim and noticed how much is in food.  She is trying to more fruits and vegetables wiht fresh fruit and V8 juice.  While she was sick, she  had some chicken soup.  She is working her way back to black decafe coffee.  She is doing well with lean proteins aiming for fish 1-2x week and chicken.  She does like beef and has the occasional  and will try to get some of the lower fat options.        Expected Outcome  Short: Continue to work on diet.  Long: Continue to use new seasoning options to avoid salt.           Nutrition Goals Discharge (Final Nutrition Goals Re-Evaluation): Nutrition Goals Re-Evaluation - 06/19/18 1014      Goals   Nutrition Goal  Heart healthy, more fruits and vegetables, read food labels, low salt diet.     Comment  Gentri has been reading food labels and watching her diet.  She has started to limit her soduim and noticed how much is in food.  She is trying to more fruits and vegetables wiht fresh fruit and V8 juice.  While she was sick, she had some chicken soup.  She is working her way back to black decafe coffee.  She is doing well with lean proteins aiming for fish 1-2x week and chicken.  She does like beef and has the occasional  and will try to get some of the lower fat options.     Expected Outcome  Short: Continue to work on  diet.  Long: Continue to use new seasoning options to avoid salt.        Psychosocial: Target Goals: Acknowledge presence or absence of significant depression and/or stress, maximize coping skills, provide positive support system. Participant is able to verbalize types and ability to use techniques and skills needed for reducing stress and depression.   Initial Review & Psychosocial Screening: Initial Psych Review & Screening - 05/29/18 1520      Initial Review   Current issues with  Current Stress Concerns    Source of Stress Concerns  Unable to perform yard/household activities      [place]?  Yes   friends     Barriers   Psychosocial barriers to participate in program  There are no identifiable barriers or psychosocial needs.;The patient should benefit from training in stress management and relaxation.      Screening Interventions   Interventions  Encouraged to exercise;Program counselor consult;Provide feedback about the scores to participant;To provide support and resources with identified psychosocial needs    Expected Outcomes  Short Term goal: Utilizing psychosocial counselor, staff and physician to assist with identification of specific Stressors or current issues interfering with healing process. Setting desired goal for each stressor or current issue identified.;Long Term Goal: Stressors or current issues are controlled or eliminated.;Short Term goal: Identification and review with participant of any Quality of Life or Depression concerns found by scoring the questionnaire.;Long Term goal: The participant improves quality of Life and PHQ9 Scores as seen by post scores and/or verbalization of changes       Quality of Life Scores:  Quality of Life - 05/29/18 1522      Quality of Life   Select  Quality of Life      Quality of Life Scores   Health/Function Pre  24.43 %    Socioeconomic Pre  27.92 %    Psych/Spiritual Pre  18 %    Family Pre  24  %    GLOBAL Pre  23.55 %      Scores of 19 and below  usually indicate a poorer quality of life in these areas.  A difference of  2-3 points is a clinically meaningful difference.  A difference of 2-3 points in the total score of the Quality of Life Index has been associated with significant improvement in overall quality of life, self-image, physical symptoms, and general health in studies assessing change in quality of life.  PHQ-9: Recent Review Flowsheet Data    Depression screen [facility] 2/9 05/29/2018   Decreased Interest 0   Down, Depressed, Hopeless 0   PHQ - 2 Score 0   Altered sleeping 0   Tired, decreased energy 1   Change in appetite 1   Feeling bad or failure about yourself  1   Trouble concentrating 0   Moving slowly or fidgety/restless 0   Suicidal thoughts 0   PHQ-9 Score 3   Difficult doing work/chores Somewhat difficult     Interpretation of Total Score  Total Score Depression Severity:  1-4 = Minimal depression, 5-9 = Mild depression, 10-14 = Moderate depression, 15-19 = Moderately severe depression, 20-27 = Severe depression   Psychosocial Evaluation and Intervention: Psychosocial Evaluation - 06/11/18 0956      Psychosocial Evaluation & Interventions   Interventions  Stress management education;Encouraged to exercise with the program and follow exercise prescription    Comments  Counselor met with Ms. Aggie Cosier Izora Gala) today for initial psychosocial evaluation.  She is a 78 year old who had (2) stents inserted on 04/24/18.  Avaleen lives alone and has a limited support system with some neighbors and friends close by.  She sleeps well most of the time and admits to some comfort eating which makes it difficult to lose weight.  Dulcinea denies a history of depression or anxiety or any current symptoms and is typically in a positive mood most of the time.  She has some stress in her life with her health issues; recent mold problems in her home; finances and several "high  maintenance' friends.  Counselor discussed self-care and setting healthy boundaries with her.  She has goals to increase her stamina and strength and to improve her diet while in this program.  Staff will follow with her.    Expected Outcomes  Short:  Colby will exercise consistently to increase her stamina and strength and to manage her stress more positively.  She will meet with the dietician to address her healthier eating goals and to learn alternative ways to cope rather than comfort eat.  Long:  Kaylanni will develop a routine of healthy lifestyle habits for her health and mental health - to cope with stres more positively.      Continue Psychosocial Services   Follow up required by staff       Psychosocial Re-Evaluation:   Psychosocial Discharge (Final Psychosocial Re-Evaluation):   Vocational Rehabilitation: Provide vocational rehab assistance to qualifying candidates.   Vocational Rehab Evaluation & Intervention: Vocational Rehab - 05/29/18 1520      Initial Vocational Rehab Evaluation & Intervention   Assessment shows need for Vocational Rehabilitation  No       Education: Education Goals: Education classes will be provided on a variety of topics geared toward better understanding of heart health and risk factor modification. Participant will state understanding/return demonstration of topics presented as noted by education test scores.  Learning Barriers/Preferences: Learning Barriers/Preferences - 05/29/18 1518      Learning Barriers/Preferences   Learning Barriers  None    Learning Preferences  Individual Instruction  Education Topics:  AED/CPR: - Group verbal and written instruction with the use of models to demonstrate the basic use of the AED with the basic ABC's of resuscitation.   General Nutrition Guidelines/Fats and Fiber: -Group instruction provided by verbal, written material, models and posters to present the general guidelines for heart healthy  nutrition. Gives an explanation and review of dietary fats and fiber.   Controlling Sodium/Reading Food Labels: -Group verbal and written material supporting the discussion of sodium use in heart healthy nutrition. Review and explanation with models, verbal and written materials for utilization of the food label.   Exercise Physiology & General Exercise Guidelines: - Group verbal and written instruction with models to review the exercise physiology of the cardiovascular system and associated critical values. Provides general exercise guidelines with specific guidelines to those with heart or lung disease.    Aerobic Exercise & Resistance Training: - Gives group verbal and written instruction on the various components of exercise. Focuses on aerobic and resistive training programs and the benefits of this training and how to safely progress through these programs..   Flexibility, Balance, Mind/Body Relaxation: Provides group verbal/written instruction on the benefits of flexibility and balance training, including mind/body exercise modes such as yoga, pilates and tai chi.  Demonstration and skill practice provided.   Stress and Anxiety: - Provides group verbal and written instruction about the health risks of elevated stress and causes of high stress.  Discuss the correlation between heart/lung disease and anxiety and treatment options. Review healthy ways to manage with stress and anxiety.   Cardiac Rehab from 06/26/2018 in [facility] Cardiac and Pulmonary Rehab  Date  06/24/18  Educator  [facility]  Instruction Review Code  1- Verbalizes Understanding      Depression: - Provides group verbal and written instruction on the correlation between heart/lung disease and depressed mood, treatment options, and the stigmas associated with seeking treatment.   Anatomy & Physiology of the Heart: - Group verbal and written instruction and models provide basic cardiac anatomy and physiology, with the coronary  electrical and arterial systems. Review of Valvular disease and Heart Failure   Cardiac Rehab from 06/26/2018 in [facility] Cardiac and Pulmonary Rehab  Date  06/12/18  Educator  CE  Instruction Review Code  1- Verbalizes Understanding      Cardiac Procedures: - Group verbal and written instruction to review commonly prescribed medications for heart disease. Reviews the medication, class of the drug, and side effects. Includes the steps to properly store meds and maintain the prescription regimen. (beta blockers and nitrates)   Cardiac Rehab from 06/26/2018 in [facility] Cardiac and Pulmonary Rehab  Date  06/26/18  Educator  CE  Instruction Review Code  1- Verbalizes Understanding      Cardiac Medications I: - Group verbal and written instruction to review commonly prescribed medications for heart disease. Reviews the medication, class of the drug, and side effects. Includes the steps to properly store meds and maintain the prescription regimen.   Cardiac Medications II: -Group verbal and written instruction to review commonly prescribed medications for heart disease. Reviews the medication, class of the drug, and side effects. (all other drug classes)   Cardiac Rehab from 06/26/2018 in [facility] Cardiac and Pulmonary Rehab  Date  06/05/18  Educator  CE  Instruction Review Code  1- Verbalizes Understanding       Go Sex-Intimacy & Heart Disease, Get SMART - Goal Setting: - Group verbal and written instruction through game format to discuss heart disease and  the return to sexual intimacy. Provides group verbal and written material to discuss and apply goal setting through the application of the S.M.A.R.T. Method.   Cardiac Rehab from 06/26/2018 in [facility] Cardiac and Pulmonary Rehab  Date  06/26/18  Educator  CE  Instruction Review Code  1- Verbalizes Understanding      Other Matters of the Heart: - Provides group verbal, written materials and models to describe Stable Angina and Peripheral Artery.  Includes description of the disease process and treatment options available to the cardiac patient.   Exercise & Equipment Safety: - Individual verbal instruction and demonstration of equipment use and safety with use of the equipment.   Cardiac Rehab from 06/26/2018 in [facility] Cardiac and Pulmonary Rehab  Date  05/29/18  Educator  [facility]  Instruction Review Code  1- Verbalizes Understanding      Infection Prevention: - Provides verbal and written material to individual with discussion of infection control including proper hand washing and proper equipment cleaning during exercise session.   Cardiac Rehab from 06/26/2018 in [facility] Cardiac and Pulmonary Rehab  Date  05/29/18  Educator  [facility]  Instruction Review Code  1- Verbalizes Understanding      Falls Prevention: - Provides verbal and written material to individual with discussion of falls prevention and safety.   Cardiac Rehab from 06/26/2018 in [facility] Cardiac and Pulmonary Rehab  Date  05/29/18  Educator  [facility]  Instruction Review Code  1- Verbalizes Understanding      Diabetes: - Individual verbal and written instruction to review signs/symptoms of diabetes, desired ranges of glucose level fasting, after meals and with exercise. Acknowledge that pre and post exercise glucose checks will be done for 3 sessions at entry of program.   Know Your Numbers and Risk Factors: -Group verbal and written instruction about important numbers in your health.  Discussion of what are risk factors and how they play a role in the disease process.  Review of Cholesterol, Blood Pressure, Diabetes, and BMI and the role they play in your overall health.   Cardiac Rehab from 06/26/2018 in [facility] Cardiac and Pulmonary Rehab  Date  06/05/18  Educator  CE  Instruction Review Code  1- Verbalizes Understanding      Sleep Hygiene: -Provides group verbal and written instruction about how sleep can affect your health.  Define sleep hygiene, discuss sleep cycles and  impact of sleep habits. Review good sleep hygiene tips.    Cardiac Rehab from 06/26/2018 in [facility] Cardiac and Pulmonary Rehab  Date  06/10/18  Educator  [facility]  Instruction Review Code  1- Verbalizes Understanding      Other: -Provides group and verbal instruction on various topics (see comments)   Knowledge Questionnaire Score: Knowledge Questionnaire Score - 05/29/18 1518      Knowledge Questionnaire Score   Pre Score  23/26   correct answers reviewed with patient, focus on nutrition and exercise      Core Components/Risk Factors/Patient Goals at Admission: Personal Goals and Risk Factors at Admission - 05/29/18 1512      Core Components/Risk Factors/Patient Goals on Admission    Weight Management  Yes;Weight Loss    Intervention  Weight Management: Develop a combined nutrition and exercise program designed to reach desired caloric intake, while maintaining appropriate intake of nutrient and fiber, sodium and fats, and appropriate energy expenditure required for the weight goal.;Weight Management: Provide education and appropriate resources to help participant work on and attain dietary goals.;Weight Management/Obesity: Establish reasonable short term and  long term weight goals.    Admit Weight  163 lb 4.8 oz (74.1 kg)    Goal Weight: Short Term  158 lb (71.7 kg)    Goal Weight: Long Term  143 lb (64.9 kg)    Expected Outcomes  Short Term: Continue to assess and modify interventions until short term weight is achieved;Long Term: Adherence to nutrition and physical activity/exercise program aimed toward attainment of established weight goal;Weight Loss: Understanding of general recommendations for a balanced deficit meal plan, which promotes 1-2 lb weight loss per week and includes a negative energy balance of [id number] kcal/d;Understanding recommendations for meals to include 15-35% energy as protein, 25-35% energy from fat, 35-60% energy from carbohydrates, less than '200mg'$  of dietary  cholesterol, 20-35 gm of total fiber daily;Understanding of distribution of calorie intake throughout the day with the consumption of 4-5 meals/snacks    Hypertension  Yes    Intervention  Provide education on lifestyle modifcations including regular physical activity/exercise, weight management, moderate sodium restriction and increased consumption of fresh fruit, vegetables, and low fat dairy, alcohol moderation, and smoking cessation.;Monitor prescription use compliance.    Expected Outcomes  Short Term: Continued assessment and intervention until BP is < 140/62m HG in hypertensive participants. < 130/820mHG in hypertensive participants with diabetes, heart failure or chronic kidney disease.;Long Term: Maintenance of blood pressure at goal levels.    Lipids  Yes    Intervention  Provide education and support for participant on nutrition & aerobic/resistive exercise along with prescribed medications to achieve LDL '70mg'$ , HDL >'40mg'$ .    Expected Outcomes  Short Term: Participant states understanding of desired cholesterol values and is compliant with medications prescribed. Participant is following exercise prescription and nutrition guidelines.;Long Term: Cholesterol controlled with medications as prescribed, with individualized exercise RX and with personalized nutrition plan. Value goals: LDL < '70mg'$ , HDL > 40 mg.       Core Components/Risk Factors/Patient Goals Review:  Goals and Risk Factor Review    Row Name 06/19/18 1002             Core Components/Risk Factors/Patient Goals Review   Personal Goals Review  Weight Management/Obesity;Hypertension;Lipids       Review  NaMickaylaas been doing well in rehab.  She is having some bad side effects with mouth pain and peeling skin.  She talked to her doctor about it and they switched to birlinta, but she is still having the same symptoms.  I encouraged her to contact her doctor again about trying to switch to effient instead.  Her symptoms are  affecting her quality of life.   Her weight is up a little as she has been stress eating but she trying to eat healthier options. Her blood pressure have been good.  She has not been checking them at home.  She does have a cuff and will start to record them to take to office with her.        Expected Outcomes  Short: Start checking blood pressure more at home and talk to doctor about blood thinner side effects.  Long: Continue to work on weight loss.           Core Components/Risk Factors/Patient Goals at Discharge (Final Review):  Goals and Risk Factor Review - 06/19/18 1002      Core Components/Risk Factors/Patient Goals Review   Personal Goals Review  Weight Management/Obesity;Hypertension;Lipids    Review  NaAthziryas been doing well in rehab.  She is having some bad side effects with  mouth pain and peeling skin.  She talked to her doctor about it and they switched to birlinta, but she is still having the same symptoms.  I encouraged her to contact her doctor again about trying to switch to effient instead.  Her symptoms are affecting her quality of life.   Her weight is up a little as she has been stress eating but she trying to eat healthier options. Her blood pressure have been good.  She has not been checking them at home.  She does have a cuff and will start to record them to take to office with her.     Expected Outcomes  Short: Start checking blood pressure more at home and talk to doctor about blood thinner side effects.  Long: Continue to work on weight loss.        ITP Comments: ITP Comments    Row Name 05/29/18 1500 06/04/18 1351 07/02/18 0613       ITP Comments  Med Review completed. Initial ITP created. Diagnosis can be found in Care Everywhere 04/25/18  30 day review completed. ITP sent to Dr. Emily Filbert, Medical Director of Cardiac Rehab. Continue with ITP unless changes are made by physician.  30 day review. Continue with ITP unless directed changes by Medical Director chart  review.        Comments:

## 2018-07-03 DIAGNOSIS — Z9861 Coronary angioplasty status: Secondary | ICD-10-CM

## 2018-07-03 NOTE — Progress Notes (Signed)
Daily Session Note  Patient Details  Name: Monica Bush MRN: [id number] Date of Birth: [birth year] Referring Provider:     Cardiac Rehab from 05/29/2018 in [facility] Cardiac and Pulmonary Rehab  Referring Provider  kelsey      Encounter Date: 07/03/2018  Check In: Session Check In - 07/03/18 3744      Check-In   Supervising physician immediately available to respond to emergencies  See telemetry face sheet for immediately available ER MD    Location  [facility]-Cardiac & Pulmonary Rehab    Staff Present  Jasper Loser BS, Exercise Physiologist;Carroll Enterkin, RN, BSN;Jessica Luan Pulling, MA, RCEP, CCRP, Exercise Physiologist;Ismelda Weatherman Tessie Fass RCP,RRT,BSRT    Medication changes reported      No    Fall or balance concerns reported     No    Warm-up and Cool-down  Performed as group-led instruction    Resistance Training Performed  Yes    VAD Patient?  No      Pain Assessment   Currently in Pain?  No/denies          Social History   Tobacco Use  Smoking Status Never Smoker  Smokeless Tobacco Never Used    Goals Met:  Independence with exercise equipment Exercise tolerated well No report of cardiac concerns or symptoms Strength training completed today  Goals Unmet:  Not Applicable  Comments: Pt able to follow exercise prescription today without complaint.  Will continue to monitor for progression.    Dr. Emily Filbert is Medical Director for [facility].

## 2018-07-08 ENCOUNTER — Encounter: Payer: Medicare Other | Attending: Cardiology

## 2018-07-08 DIAGNOSIS — Z955 Presence of coronary angioplasty implant and graft: Secondary | ICD-10-CM

## 2018-07-08 DIAGNOSIS — Z9861 Coronary angioplasty status: Secondary | ICD-10-CM | POA: Diagnosis not present

## 2018-07-08 NOTE — Progress Notes (Signed)
Daily Session Note  Patient Details  Name: Monica Bush MRN: [id number] Date of Birth: 28-Mar-1941 Referring Provider:     Cardiac Rehab from 05/29/2018 in [facility] Cardiac and Pulmonary Rehab  Referring Provider  kelsey      Encounter Date: 07/08/2018  Check In: Session Check In - 07/08/18 0917      Check-In   Supervising physician immediately available to respond to emergencies  See telemetry face sheet for immediately available ER MD    Location  [facility]-Cardiac & Pulmonary Rehab    Staff Present  Heath Lark, RN, BSN, CCRP;Jeanna Durrell BS, Exercise Physiologist;Tomasita Beevers [place], MA, RCEP, CCRP, Exercise Physiologist;Joseph [employer], BA, ACSM CEP, Exercise Physiologist    Medication changes reported      No    Fall or balance concerns reported     No    Warm-up and Cool-down  Performed as group-led instruction    Resistance Training Performed  Yes    VAD Patient?  No    PAD/SET Patient?  No      Pain Assessment   Currently in Pain?  No/denies          Social History   Tobacco Use  Smoking Status Never Smoker  Smokeless Tobacco Never Used    Goals Met:  Independence with exercise equipment Exercise tolerated well No report of cardiac concerns or symptoms Strength training completed today  Goals Unmet:  Not Applicable  Comments: Pt able to follow exercise prescription today without complaint.  Will continue to monitor for progression.    Dr. Emily Filbert is Medical Director for [facility].

## 2018-07-10 DIAGNOSIS — Z9861 Coronary angioplasty status: Secondary | ICD-10-CM | POA: Diagnosis not present

## 2018-07-10 NOTE — Progress Notes (Signed)
Daily Session Note  Patient Details  Name: Monica Bush MRN: [id number] Date of Birth: 05-28-1940 Referring Provider:     Cardiac Rehab from 05/29/2018 in [facility] Cardiac and Pulmonary Rehab  Referring Provider  kelsey      Encounter Date: 07/10/2018  Check In: Session Check In - 07/10/18 0914      Check-In   Supervising physician immediately available to respond to emergencies  See telemetry face sheet for immediately available ER MD    Location  [facility]-Cardiac & Pulmonary Rehab    Staff Present  Alberteen Sam, MA, RCEP, CCRP, Exercise Physiologist;Joseph Hood RCP,RRT,BSRT;Carroll Enterkin, RN, BSN;Jeanna Durrell BS, Exercise Physiologist    Medication changes reported      No    Fall or balance concerns reported     No    Warm-up and Cool-down  Performed as group-led Higher education careers adviser Performed  Yes    VAD Patient?  No    PAD/SET Patient?  No      Pain Assessment   Currently in Pain?  No/denies          Social History   Tobacco Use  Smoking Status Never Smoker  Smokeless Tobacco Never Used    Goals Met:  Independence with exercise equipment Exercise tolerated well No report of cardiac concerns or symptoms Strength training completed today  Goals Unmet:  Not Applicable  Comments: Pt able to follow exercise prescription today without complaint.  Will continue to monitor for progression.   Dr. Emily Filbert is Medical Director for [place].

## 2018-07-17 ENCOUNTER — Telehealth: Payer: Self-pay

## 2018-07-17 NOTE — Telephone Encounter (Signed)
Monica Bush is still sick and wont be at class today

## 2018-07-23 ENCOUNTER — Encounter: Payer: Self-pay | Admitting: *Deleted

## 2018-07-23 DIAGNOSIS — Z9861 Coronary angioplasty status: Secondary | ICD-10-CM

## 2018-07-30 ENCOUNTER — Encounter: Payer: Self-pay | Admitting: *Deleted

## 2018-07-30 DIAGNOSIS — Z955 Presence of coronary angioplasty implant and graft: Secondary | ICD-10-CM

## 2018-07-30 DIAGNOSIS — Z9861 Coronary angioplasty status: Secondary | ICD-10-CM

## 2018-07-30 NOTE — Progress Notes (Signed)
Cardiac Individual Treatment Plan  Patient Details  Name: Onnie Alatorre MRN: [id number] Date of Birth: 06-18-1940 Referring Provider:     Cardiac Rehab from 05/29/2018 in [facility] Cardiac [facility] Rehab  Referring Provider  kelsey      Initial Encounter Date:    Cardiac Rehab from 05/29/2018 in [facility] Cardiac and Pulmonary Rehab  Date  05/29/18      Visit Diagnosis: S/P PTCA (percutaneous transluminal coronary angioplasty)  Status post coronary artery stent placement  Patient's Home Medications on Admission:  Current Outpatient Medications:  .  amLODipine (NORVASC) 10 MG tablet, Take 10 mg by mouth daily., Disp: , Rfl:  .  aspirin 81 MG chewable tablet, Chew 81 mg by mouth daily., Disp: , Rfl:  .  atorvastatin (LIPITOR) 40 MG tablet, Take 40 mg by mouth daily., Disp: , Rfl:  .  augmented betamethasone dipropionate (DIPROLENE-AF) 0.05 % ointment, Apply topically., Disp: , Rfl:  .  BRILINTA 90 MG TABS tablet, , Disp: , Rfl:  .  chlorpheniramine (CHLOR-TRIMETON) 4 MG tablet, Take by mouth., Disp: , Rfl:  .  Cholecalciferol (VITAMIN D3) 25 MCG (1000 UT) CAPS, Take by mouth., Disp: , Rfl:  .  fluticasone (FLONASE) 50 MCG/ACT nasal spray, Place into the nose., Disp: , Rfl:  .  metroNIDAZOLE (METROGEL) 1 % gel, Apply topically., Disp: , Rfl:  .  nitroGLYCERIN (NITROSTAT) 0.4 MG SL tablet, , Disp: , Rfl:   Past Medical History: Past Medical History:  Diagnosis Date  . Anxiety   . Asthma   . CAD (coronary artery disease)   . Hyperlipemia   . Hypertension   . Osteopetrosis   . Reflux esophagitis     Tobacco Use: Social History   Tobacco Use  Smoking Status Never Smoker  Smokeless Tobacco Never Used    Labs: Recent Review Flowsheet Data    There is no flowsheet data to display.       Exercise Target Goals: Exercise Program Goal: Individual exercise prescription set using results from initial 6 min walk test and THRR while considering  patient's activity barriers and  safety.   Exercise Prescription Goal: Initial exercise prescription builds to 30-45 minutes a day of aerobic activity, 2-3 days per week.  Home exercise guidelines will be given to patient during program as part of exercise prescription that the participant will acknowledge.  Activity Barriers & Risk Stratification:   6 Minute Walk: 6 Minute Walk    Row Name 05/29/18 1447         6 Minute Walk   Phase  Initial     Distance  1228 feet     Walk Time  6 minutes     # of Rest Breaks  0     MPH  2.33     METS  2.31     RPE  11     VO2 Peak  8.08     Symptoms  No     Resting HR  72 bpm     Resting BP  124/70     Resting Oxygen Saturation   98 %     Exercise Oxygen Saturation  during 6 min walk  99 %     Max Ex. HR  112 bpm     Max Ex. BP  124/66     2 Minute Post BP  122/66        Oxygen Initial Assessment:   Oxygen Re-Evaluation:   Oxygen Discharge (Final Oxygen Re-Evaluation):   Initial Exercise  Prescription: Initial Exercise Prescription - 05/29/18 1400      Date of Initial Exercise RX and Referring Provider   Date  05/29/18    Referring Provider  kelsey      Treadmill   MPH  1.7    Grade  0    Minutes  15    METs  2.3      NuStep   Level  2    SPM  80    Minutes  15    METs  2.3      Biostep-RELP   Level  2    SPM  50    Minutes  15    METs  2      Prescription Details   Frequency (times per week)  3    Duration  Progress to 30 minutes of continuous aerobic without signs/symptoms of physical distress      Intensity   THRR 40-80% of Max Heartrate  101-130    Ratings of Perceived Exertion  11-15    Perceived Dyspnea  0-4      Resistance Training   Training Prescription  Yes    Weight  3 lb    Reps  10-15       Perform Capillary Blood Glucose checks as needed.  Exercise Prescription Changes: Exercise Prescription Changes    Row Name 05/29/18 1400 06/10/18 1400 06/25/18 1600 07/09/18 1400 07/23/18 1100     Response to Exercise    Blood Pressure (Admit)  124/70  130/62  126/64  110/56  136/70   Blood Pressure (Exercise)  124/66  138/74  144/64  124/70  122/62   Blood Pressure (Exit)  122/66  124/62  118/66  128/70  122/62   Heart Rate (Admit)  72 bpm  75 bpm  82 bpm  85 bpm  63 bpm   Heart Rate (Exercise)  112 bpm  118 bpm  122 bpm  112 bpm  109 bpm   Heart Rate (Exit)  75 bpm  83 bpm  90 bpm  76 bpm  74 bpm   Oxygen Saturation (Admit)  98 %  -  -  -  -   Oxygen Saturation (Exercise)  99 %  -  -  -  -   Rating of Perceived Exertion (Exercise)  _0 Symptoms  -  none  none  none  none   Comments  -  second full day of exercise  -  -  -   Duration  -  Continue with 30 min of aerobic exercise without signs/symptoms of physical distress.  Continue with 30 min of aerobic exercise without signs/symptoms of physical distress.  Continue with 30 min of aerobic exercise without signs/symptoms of physical distress.  Continue with 30 min of aerobic exercise without signs/symptoms of physical distress.   Intensity  -  THRR unchanged  THRR unchanged  THRR unchanged  THRR unchanged     Progression   Progression  -  Continue to progress workloads to maintain intensity without signs/symptoms of physical distress.  Continue to progress workloads to maintain intensity without signs/symptoms of physical distress.  Continue to progress workloads to maintain intensity without signs/symptoms of physical distress.  Continue to progress workloads to maintain intensity without signs/symptoms of physical distress.   Average METs  -  2.21  3.26  2.59  2.59     Resistance Training   Training Prescription  -  Yes  Yes  Yes  Yes   Weight  -  3 lbs  3 lbs  3 lbs  3 lbs   Reps  -  10-15  10-15  10-15  10-15     Interval Training   Interval Training  -  No  No  No  No     Treadmill   MPH  -  1.7  2.3  2.3  2.3   Grade  -  0  _0 Minutes  -  _1 METs  -  2.3  3.08  3.08  3.08     NuStep   Level  -  _2 Minutes  -  _3 METs  -  2.1  2.7  2.7  2.7     Biostep-RELP   Level  -  _4 Minutes  -  _5 METs  -  _6 Home Exercise Plan   Plans to continue exercise at  -  -  [employer] (comment) walking and [employer] (comment) walking and [employer] (comment) walking and [employer]   Frequency  -  -  Add 2 additional days to program exercise sessions.  Add 2 additional days to program exercise sessions.  Add 2 additional days to program exercise sessions.   Initial Home Exercises Provided  -  -  06/19/18  06/19/18  06/19/18      Exercise Comments: Exercise Comments    Row Name 06/05/18 [phone]           Exercise Comments  First full day of exercise!  Patient was oriented to gym and equipment including functions, settings, policies, and procedures.  Patient's individual exercise prescription and treatment plan were reviewed.  All starting workloads were established based on the results of the 6 minute walk test done at initial orientation visit.  The plan for exercise progression was also introduced and progression will be customized based on patient's performance and goals.          Exercise Goals and Review: Exercise Goals    Row Name 05/29/18 1446             Exercise Goals   Increase Physical Activity  Yes       Intervention  Provide advice, education, support and counseling about physical activity/exercise needs.;Develop an individualized exercise prescription for aerobic and resistive training based on initial evaluation findings, risk stratification, comorbidities and participant's personal goals.       Expected Outcomes  Short Term: Attend rehab on a regular basis to increase amount of physical activity.;Long Term: Add in home exercise to make exercise part of routine and to increase amount of physical activity.;Long Term: Exercising regularly at least 3-5 days a week.        Increase Strength and Stamina  Yes       Intervention  Provide advice, education, support and counseling about physical activity/exercise needs.;Develop an individualized exercise prescription for aerobic and resistive training based on initial evaluation findings, risk stratification, comorbidities and participant's personal goals.       Expected Outcomes  Short Term: Increase workloads from initial exercise prescription for resistance, speed, and METs.;Short Term: Perform  resistance training exercises routinely during rehab and add in resistance training at home;Long Term: Improve cardiorespiratory fitness, muscular endurance and strength as measured by increased METs and functional capacity (6MWT)       Able to understand and use rate of perceived exertion (RPE) scale  Yes       Intervention  Provide education and explanation on how to use RPE scale       Expected Outcomes  Short Term: Able to use RPE daily in rehab to express subjective intensity level;Long Term:  Able to use RPE to guide intensity level when exercising independently       Knowledge and understanding of Target Heart Rate Range (THRR)  Yes       Intervention  Provide education and explanation of THRR including how the numbers were predicted and where they are located for reference       Expected Outcomes  Short Term: Able to use daily as guideline for intensity in rehab;Short Term: Able to state/look up THRR;Long Term: Able to use THRR to govern intensity when exercising independently       Able to check pulse independently  Yes       Intervention  Provide education and demonstration on how to check pulse in carotid and radial arteries.;Review the importance of being able to check your own pulse for safety during independent exercise       Expected Outcomes  Short Term: Able to explain why pulse checking is important during independent exercise;Long Term: Able to check pulse independently and accurately       Understanding of Exercise  Prescription  Yes       Intervention  Provide education, explanation, and written materials on patient's individual exercise prescription       Expected Outcomes  Short Term: Able to explain program exercise prescription;Long Term: Able to explain home exercise prescription to exercise independently          Exercise Goals Re-Evaluation : Exercise Goals Re-Evaluation    Row Name 06/05/18 4166 06/10/18 1429 06/19/18 0955 06/25/18 1602 07/08/18 1039     Exercise Goal Re-Evaluation   Exercise Goals Review  Increase Physical Activity;Increase Strength and Stamina;Able to understand and use rate of perceived exertion (RPE) scale;Knowledge and understanding of Target Heart Rate Range (THRR);Understanding of Exercise Prescription  Increase Physical Activity;Increase Strength and Stamina;Understanding of Exercise Prescription  Increase Physical Activity;Increase Strength and Stamina;Understanding of Exercise Prescription;Able to understand and use rate of perceived exertion (RPE) scale;Knowledge and understanding of Target Heart Rate Range (THRR);Able to check pulse independently  Increase Physical Activity;Increase Strength and Stamina;Understanding of Exercise Prescription  Increase Physical Activity;Able to understand and use rate of perceived exertion (RPE) scale;Knowledge and understanding of Target Heart Rate Range (THRR);Understanding of Exercise Prescription;Increase Strength and Stamina;Able to check pulse independently   Comments  Reviewed RPE scale, THR and program prescription with pt today.  Pt voiced understanding and was given a copy of goals to take home.   Hailley is off to a good start in rehab.  She has completed her first two full days of exercise.  She was wanting to get on her third piece today!!  We will start to increase her workloads and montior her progress.   Alaysha is doing well in rehab.  She already feels like her strength and stamina are coming back.  She is already going to gym  [employer]) for 30 min on the treadmill.  Reviewed home exercise with pt today.  Pt plans to walking  and going to [employer] for exercise.  Reviewed THR, pulse, RPE, sign and symptoms, NTG use, and when to call 911 or MD.  Also discussed weather considerations and indoor options.  Pt voiced understanding.  Kynzlee is doing well in rehab.  She is up to 2.3 mph on the treadmill.  We will continue to monitor her progress.   Veleta has noticed that she can do more at home since starting Cardiac Rehab. She is up to Level 4 on the Nustep. She is going to [employer] 3 days per week in addition to Cardiac Rehab. She is encouraging her niece to exercise as well.    Expected Outcomes  Short: Use RPE daily to regulate intensity. Long: Follow program prescription in THR.  Short: Increase workloads.  Long; Continue to follow program prescription.   Short: Start to add in more exercise at home.  Long: Continue to increase strength and stamina.   Short: Increase workloads.  Long: Continue to increase strength and stamina.   Short: Add hand weights one day per week at [employer]. Long: Continue with exercising at [employer] and graduate from the program. Progressing to finding hills easier.    [place] Name 07/23/18 1151             Exercise Goal Re-Evaluation   Exercise Goals Review  Increase Physical Activity;Increase Strength and Stamina;Understanding of Exercise Prescription       Comments  Raechal has only had one visit since last review as she has been out sick.  She will be walking at home.  We will continue to monitor her progress at home.        Expected Outcomes  Short: Continue to walk at home.  Long: Continue to increase strength and stamina.           Discharge Exercise Prescription (Final Exercise Prescription Changes): Exercise Prescription Changes - 07/23/18 1100      Response to Exercise   Blood Pressure (Admit)  136/70    Blood Pressure (Exercise)  122/62    Blood Pressure (Exit)   122/62    Heart Rate (Admit)  63 bpm    Heart Rate (Exercise)  109 bpm    Heart Rate (Exit)  74 bpm    Rating of Perceived Exertion (Exercise)  13    Symptoms  none    Duration  Continue with 30 min of aerobic exercise without signs/symptoms of physical distress.    Intensity  THRR unchanged      Progression   Progression  Continue to progress workloads to maintain intensity without signs/symptoms of physical distress.    Average METs  2.59      Resistance Training   Training Prescription  Yes    Weight  3 lbs    Reps  10-15      Interval Training   Interval Training  No      Treadmill   MPH  2.3    Grade  1    Minutes  15    METs  3.08      NuStep   Level  4    Minutes  15    METs  2.7      Biostep-RELP   Level  3    Minutes  15    METs  2      Home Exercise Plan   Plans to continue exercise at  [employer] (comment)   walking and [employer]   Frequency  Add 2 additional  days to program exercise sessions.    Initial Home Exercises Provided  06/19/18       Nutrition:  Target Goals: Understanding of nutrition guidelines, daily intake of sodium <1533m, cholesterol <2077m calories 30% from fat and 7% or less from saturated fats, daily to have 5 or more servings of fruits and vegetables.  Biometrics: Pre Biometrics - 05/29/18 1446      Pre Biometrics   Height  5' 1.5" (1.562 m)    Weight  163 lb 4.8 oz (74.1 kg)    Waist Circumference  35 inches    Hip Circumference  43 inches    Waist to Hip Ratio  0.81 %    BMI (Calculated)  30.36    Single Leg Stand  10.68 seconds        Nutrition Therapy Plan and Nutrition Goals: Nutrition Therapy & Goals - 05/29/18 1522      Intervention Plan   Intervention  Prescribe, educate and counsel regarding individualized specific dietary modifications aiming towards targeted core components such as weight, hypertension, lipid management, diabetes, heart failure and other comorbidities.;Nutrition handout(s)  given to patient.    Expected Outcomes  Short Term Goal: Understand basic principles of dietary content, such as calories, fat, sodium, cholesterol and nutrients.;Long Term Goal: Adherence to prescribed nutrition plan.;Short Term Goal: A plan has been developed with personal nutrition goals set during dietitian appointment.       Nutrition Assessments: Nutrition Assessments - 05/29/18 1522      MEDFICTS Scores   Pre Score  18       Nutrition Goals Re-Evaluation: Nutrition Goals Re-Evaluation    Row Name 06/19/18 1014 07/08/18 1051           Goals   Current Weight  -  160 lb (72.6 kg)      Nutrition Goal  Heart healthy, more fruits and vegetables, read food labels, low salt diet.   -      Comment  NaAmoritaas been reading food labels and watching her diet.  She has started to limit her soduim and noticed how much is in food.  She is trying to more fruits and vegetables wiht fresh fruit and V8 juice.  While she was sick, she had some chicken soup.  She is working her way back to black decafe coffee.  She is doing well with lean proteins aiming for fish 1-2x week and chicken.  She does like beef and has the occasional  and will try to get some of the lower fat options.   NaLatorias eating a heart healthy diet. She makes a spinach omelet for breakfast. She eats salad for lunch. She has steak or salmon cut into small servings for protein with her salad. She avoids sweets. She is trying to stop using creamer in her coffee. She cooks in small portions so she has no leftovers or doesnt eat too much.       Expected Outcome  Short: Continue to work on diet.  Long: Continue to use new seasoning options to avoid salt.   Short: Continue healthy eating and making small changes. Long: Continue cooking small servings since she lives alone.         Nutrition Goals Discharge (Final Nutrition Goals Re-Evaluation): Nutrition Goals Re-Evaluation - 07/08/18 1051      Goals   Current Weight  160 lb (72.6 kg)     Comment  NaEmilenes eating a heart healthy diet. She makes a spinach omelet for breakfast. She  eats salad for lunch. She has steak or salmon cut into small servings for protein with her salad. She avoids sweets. She is trying to stop using creamer in her coffee. She cooks in small portions so she has no leftovers or doesnt eat too much.     Expected Outcome  Short: Continue healthy eating and making small changes. Long: Continue cooking small servings since she lives alone.       Psychosocial: Target Goals: Acknowledge presence or absence of significant depression and/or stress, maximize coping skills, provide positive support system. Participant is able to verbalize types and ability to use techniques and skills needed for reducing stress and depression.   Initial Review & Psychosocial Screening: Initial Psych Review & Screening - 05/29/18 1520      Initial Review   Current issues with  Current Stress Concerns    Source of Stress Concerns  Unable to perform yard/household activities      Gerald?  Yes   friends     Barriers   Psychosocial barriers to participate in program  There are no identifiable barriers or psychosocial needs.;The patient should benefit from training in stress management and relaxation.      Screening Interventions   Interventions  Encouraged to exercise;Program counselor consult;Provide feedback about the scores to participant;To provide support and resources with identified psychosocial needs    Expected Outcomes  Short Term goal: Utilizing psychosocial counselor, staff and physician to assist with identification of specific Stressors or current issues interfering with healing process. Setting desired goal for each stressor or current issue identified.;Long Term Goal: Stressors or current issues are controlled or eliminated.;Short Term goal: Identification and review with participant of any Quality of Life or Depression concerns found by  scoring the questionnaire.;Long Term goal: The participant improves quality of Life and PHQ9 Scores as seen by post scores and/or verbalization of changes       Quality of Life Scores:  Quality of Life - 05/29/18 1522      Quality of Life   Select  Quality of Life      Quality of Life Scores   Health/Function Pre  24.43 %    Socioeconomic Pre  27.92 %    Psych/Spiritual Pre  18 %    Family Pre  24 %    GLOBAL Pre  23.55 %      Scores of 19 and below usually indicate a poorer quality of life in these areas.  A difference of  2-3 points is a clinically meaningful difference.  A difference of 2-3 points in the total score of the Quality of Life Index has been associated with significant improvement in overall quality of life, self-image, physical symptoms, and general health in studies assessing change in quality of life.  PHQ-9: Recent Review Flowsheet Data    Depression screen [facility] 2/9 05/29/2018   Decreased Interest 0   Down, Depressed, Hopeless 0   PHQ - 2 Score 0   Altered sleeping 0   Tired, decreased energy 1   Change in appetite 1   Feeling bad or failure about yourself  1   Trouble concentrating 0   Moving slowly or fidgety/restless 0   Suicidal thoughts 0   PHQ-9 Score 3   Difficult doing work/chores Somewhat difficult     Interpretation of Total Score  Total Score Depression Severity:  1-4 = Minimal depression, 5-9 = Mild depression, 10-14 = Moderate depression, 15-19 = Moderately severe depression, 20-27 =  Severe depression   Psychosocial Evaluation and Intervention: Psychosocial Evaluation - 06/11/18 0956      Psychosocial Evaluation & Interventions   Interventions  Stress management education;Encouraged to exercise with the program and follow exercise prescription    Comments  Counselor met with Ms. Aggie Cosier Izora Gala) today for initial psychosocial evaluation.  She is a 78 year old who had (2) stents inserted on 04/24/18.  Madeleyn lives alone and has a limited support  system with some neighbors and friends close by.  She sleeps well most of the time and admits to some comfort eating which makes it difficult to lose weight.  Zandra denies a history of depression or anxiety or any current symptoms and is typically in a positive mood most of the time.  She has some stress in her life with her health issues; recent mold problems in her home; finances and several "high maintenance' friends.  Counselor discussed self-care and setting healthy boundaries with her.  She has goals to increase her stamina and strength and to improve her diet while in this program.  Staff will follow with her.    Expected Outcomes  Short:  Rafia will exercise consistently to increase her stamina and strength and to manage her stress more positively.  She will meet with the dietician to address her healthier eating goals and to learn alternative ways to cope rather than comfort eat.  Long:  Michael will develop a routine of healthy lifestyle habits for her health and mental health - to cope with stres more positively.      Continue Psychosocial Services   Follow up required by staff       Psychosocial Re-Evaluation: Psychosocial Re-Evaluation    [place] Name 07/08/18 1044             Psychosocial Re-Evaluation   Current issues with  Current Stress Concerns;Current Sleep Concerns       Comments  Raevyn has a positive outlook. She reads for fun. Clutter in her home is a stress concern. She is working on de-cluttering to relieve stress and using goal settting and checklists to get this done by pacing herself. She has noticed she sleeps better on exercise days. Encouraged to nap if she doesn't sleep well and has time for it earlier in the day. She has long distance friends and family that she talks to daily. She is especially close to her niece in [place]. They discuss heart disease and managing their risk factors.        Expected Outcomes  Short: Continue to de-clutter and find enjoyable activities to  do for self-care. Long: Use relaxation techniques to de-stress and get to sleep when she can.          Psychosocial Discharge (Final Psychosocial Re-Evaluation): Psychosocial Re-Evaluation - 07/08/18 1044      Psychosocial Re-Evaluation   Current issues with  Current Stress Concerns;Current Sleep Concerns    Comments  Sheron has a positive outlook. She reads for fun. Clutter in her home is a stress concern. She is working on de-cluttering to relieve stress and using goal settting and checklists to get this done by pacing herself. She has noticed she sleeps better on exercise days. Encouraged to nap if she doesn't sleep well and has time for it earlier in the day. She has long distance friends and family that she talks to daily. She is especially close to her niece in [place]. They discuss heart disease and managing their risk factors.     Expected  Outcomes  Short: Continue to de-clutter and find enjoyable activities to do for self-care. Long: Use relaxation techniques to de-stress and get to sleep when she can.       Vocational Rehabilitation: Provide vocational rehab assistance to qualifying candidates.   Vocational Rehab Evaluation & Intervention: Vocational Rehab - 05/29/18 1520      Initial Vocational Rehab Evaluation & Intervention   Assessment shows need for Vocational Rehabilitation  No       Education: Education Goals: Education classes will be provided on a variety of topics geared toward better understanding of heart health and risk factor modification. Participant will state understanding/return demonstration of topics presented as noted by education test scores.  Learning Barriers/Preferences: Learning Barriers/Preferences - 05/29/18 1518      Learning Barriers/Preferences   Learning Barriers  None    Learning Preferences  Individual Instruction       Education Topics:  AED/CPR: - Group verbal and written instruction with the use of models to demonstrate the basic  use of the AED with the basic ABC's of resuscitation.   General Nutrition Guidelines/Fats and Fiber: -Group instruction provided by verbal, written material, models and posters to present the general guidelines for heart healthy nutrition. Gives an explanation and review of dietary fats and fiber.   Controlling Sodium/Reading Food Labels: -Group verbal and written material supporting the discussion of sodium use in heart healthy nutrition. Review and explanation with models, verbal and written materials for utilization of the food label.   Exercise Physiology & General Exercise Guidelines: - Group verbal and written instruction with models to review the exercise physiology of the cardiovascular system and associated critical values. Provides general exercise guidelines with specific guidelines to those with heart or lung disease.    Aerobic Exercise & Resistance Training: - Gives group verbal and written instruction on the various components of exercise. Focuses on aerobic and resistive training programs and the benefits of this training and how to safely progress through these programs..   Flexibility, Balance, Mind/Body Relaxation: Provides group verbal/written instruction on the benefits of flexibility and balance training, including mind/body exercise modes such as yoga, pilates and tai chi.  Demonstration and skill practice provided.   Stress and Anxiety: - Provides group verbal and written instruction about the health risks of elevated stress and causes of high stress.  Discuss the correlation between heart/lung disease and anxiety and treatment options. Review healthy ways to manage with stress and anxiety.   Cardiac Rehab from 07/10/2018 in [facility] Cardiac and Pulmonary Rehab  Date  06/24/18  Educator  [facility]  Instruction Review Code  1- Verbalizes Understanding      Depression: - Provides group verbal and written instruction on the correlation between heart/lung disease and depressed  mood, treatment options, and the stigmas associated with seeking treatment.   Cardiac Rehab from 07/10/2018 in [facility] Cardiac and Pulmonary Rehab  Date  07/08/18  Educator  [facility]  Instruction Review Code  1- Verbalizes Understanding      Anatomy & Physiology of the Heart: - Group verbal and written instruction and models provide basic cardiac anatomy and physiology, with the coronary electrical and arterial systems. Review of Valvular disease and Heart Failure   Cardiac Rehab from 07/10/2018 in [facility] Cardiac and Pulmonary Rehab  Date  06/12/18  Educator  CE  Instruction Review Code  1- Verbalizes Understanding      Cardiac Procedures: - Group verbal and written instruction to review commonly prescribed medications for heart disease. Reviews the medication,  class of the drug, and side effects. Includes the steps to properly store meds and maintain the prescription regimen. (beta blockers and nitrates)   Cardiac Rehab from 07/10/2018 in [facility] Cardiac and Pulmonary Rehab  Date  06/26/18  Educator  CE  Instruction Review Code  1- Verbalizes Understanding      Cardiac Medications I: - Group verbal and written instruction to review commonly prescribed medications for heart disease. Reviews the medication, class of the drug, and side effects. Includes the steps to properly store meds and maintain the prescription regimen.   Cardiac Medications II: -Group verbal and written instruction to review commonly prescribed medications for heart disease. Reviews the medication, class of the drug, and side effects. (all other drug classes)   Cardiac Rehab from 07/10/2018 in [facility] Cardiac and Pulmonary Rehab  Date  07/10/18  Educator  CE  Instruction Review Code  1- Verbalizes Understanding       Go Sex-Intimacy & Heart Disease, Get SMART - Goal Setting: - Group verbal and written instruction through game format to discuss heart disease and the return to sexual intimacy. Provides group verbal and written  material to discuss and apply goal setting through the application of the S.M.A.R.T. Method.   Cardiac Rehab from 07/10/2018 in [facility] Cardiac and Pulmonary Rehab  Date  06/26/18  Educator  CE  Instruction Review Code  1- Verbalizes Understanding      Other Matters of the Heart: - Provides group verbal, written materials and models to describe Stable Angina and Peripheral Artery. Includes description of the disease process and treatment options available to the cardiac patient.   Exercise & Equipment Safety: - Individual verbal instruction and demonstration of equipment use and safety with use of the equipment.   Cardiac Rehab from 07/10/2018 in [facility] Cardiac and Pulmonary Rehab  Date  05/29/18  Educator  [facility]  Instruction Review Code  1- Verbalizes Understanding      Infection Prevention: - Provides verbal and written material to individual with discussion of infection control including proper hand washing and proper equipment cleaning during exercise session.   Cardiac Rehab from 07/10/2018 in [facility] Cardiac and Pulmonary Rehab  Date  05/29/18  Educator  [facility]  Instruction Review Code  1- Verbalizes Understanding      Falls Prevention: - Provides verbal and written material to individual with discussion of falls prevention and safety.   Cardiac Rehab from 07/10/2018 in [facility] Cardiac and Pulmonary Rehab  Date  05/29/18  Educator  [facility]  Instruction Review Code  1- Verbalizes Understanding      Diabetes: - Individual verbal and written instruction to review signs/symptoms of diabetes, desired ranges of glucose level fasting, after meals and with exercise. Acknowledge that pre and post exercise glucose checks will be done for 3 sessions at entry of program.   Know Your Numbers and Risk Factors: -Group verbal and written instruction about important numbers in your health.  Discussion of what are risk factors and how they play a role in the disease process.  Review of Cholesterol, Blood Pressure,  Diabetes, and BMI and the role they play in your overall health.   Cardiac Rehab from 07/10/2018 in [facility] Cardiac and Pulmonary Rehab  Date  07/10/18  Educator  CE  Instruction Review Code  1- Verbalizes Understanding      Sleep Hygiene: -Provides group verbal and written instruction about how sleep can affect your health.  Define sleep hygiene, discuss sleep cycles and impact of sleep habits. Review good sleep  hygiene tips.    Cardiac Rehab from 07/10/2018 in [facility] Cardiac and Pulmonary Rehab  Date  06/10/18  Educator  [facility]  Instruction Review Code  1- Verbalizes Understanding      Other: -Provides group and verbal instruction on various topics (see comments)   Knowledge Questionnaire Score: Knowledge Questionnaire Score - 05/29/18 1518      Knowledge Questionnaire Score   Pre Score  23/26   correct answers reviewed with patient, focus on nutrition and exercise      Core Components/Risk Factors/Patient Goals at Admission: Personal Goals and Risk Factors at Admission - 05/29/18 1512      Core Components/Risk Factors/Patient Goals on Admission    Weight Management  Yes;Weight Loss    Intervention  Weight Management: Develop a combined nutrition and exercise program designed to reach desired caloric intake, while maintaining appropriate intake of nutrient and fiber, sodium and fats, and appropriate energy expenditure required for the weight goal.;Weight Management: Provide education and appropriate resources to help participant work on and attain dietary goals.;Weight Management/Obesity: Establish reasonable short term and long term weight goals.    Admit Weight  163 lb 4.8 oz (74.1 kg)    Goal Weight: Short Term  158 lb (71.7 kg)    Goal Weight: Long Term  143 lb (64.9 kg)    Expected Outcomes  Short Term: Continue to assess and modify interventions until short term weight is achieved;Long Term: Adherence to nutrition and physical activity/exercise program aimed toward attainment of  established weight goal;Weight Loss: Understanding of general recommendations for a balanced deficit meal plan, which promotes 1-2 lb weight loss per week and includes a negative energy balance of [phone] kcal/d;Understanding recommendations for meals to include 15-35% energy as protein, 25-35% energy from fat, 35-60% energy from carbohydrates, less than 288m of dietary cholesterol, 20-35 gm of total fiber daily;Understanding of distribution of calorie intake throughout the day with the consumption of 4-5 meals/snacks    Hypertension  Yes    Intervention  Provide education on lifestyle modifcations including regular physical activity/exercise, weight management, moderate sodium restriction and increased consumption of fresh fruit, vegetables, and low fat dairy, alcohol moderation, and smoking cessation.;Monitor prescription use compliance.    Expected Outcomes  Short Term: Continued assessment and intervention until BP is < 140/976mHG in hypertensive participants. < 130/8048mG in hypertensive participants with diabetes, heart failure or chronic kidney disease.;Long Term: Maintenance of blood pressure at goal levels.    Lipids  Yes    Intervention  Provide education and support for participant on nutrition & aerobic/resistive exercise along with prescribed medications to achieve LDL <24m24mDL >40mg51m Expected Outcomes  Short Term: Participant states understanding of desired cholesterol values and is compliant with medications prescribed. Participant is following exercise prescription and nutrition guidelines.;Long Term: Cholesterol controlled with medications as prescribed, with individualized exercise RX and with personalized nutrition plan. Value goals: LDL < 24mg,64m > 40 mg.       Core Components/Risk Factors/Patient Goals Review:  Goals and Risk Factor Review    Row Name 06/19/18 1002 07/08/18 1129           Core Components/Risk Factors/Patient Goals Review   Personal Goals Review   Weight Management/Obesity;Hypertension;Lipids  Weight Management/Obesity;Lipids;Hypertension      Review  Cortnee Rickettaeen doing well in rehab.  She is having some bad side effects with mouth pain and peeling skin.  She talked to her doctor about it and they switched to birlinta, but  she is still having the same symptoms.  I encouraged her to contact her doctor again about trying to switch to effient instead.  Her symptoms are affecting her quality of life.   Her weight is up a little as she has been stress eating but she trying to eat healthier options. Her blood pressure have been good.  She has not been checking them at home.  She does have a cuff and will start to record them to take to office with her.   Jacy has lost 6 lbs due to increased activity and healthy eating. She is happy with this outcome and will continue with her current lifestyle changes to achieve more weight loss. She take her blood pressure and cholesterol medications as prescribed. She has a blood pressure cuff at home but does not check her bp regularly. She has bloodwork done every 3-6 months or more often if needed.       Expected Outcomes  Short: Start checking blood pressure more at home and talk to doctor about blood thinner side effects.  Long: Continue to work on weight loss.   Short: start checking blood pressure at home and bring cuff in to verify accuracy. She had not completed this goal from last review but wants to. Long: Continue her current lifestyle changes to reduce weight and manage risk factors.          Core Components/Risk Factors/Patient Goals at Discharge (Final Review):  Goals and Risk Factor Review - 07/08/18 1129      Core Components/Risk Factors/Patient Goals Review   Personal Goals Review  Weight Management/Obesity;Lipids;Hypertension    Review  Laikyn has lost 6 lbs due to increased activity and healthy eating. She is happy with this outcome and will continue with her current lifestyle changes to achieve  more weight loss. She take her blood pressure and cholesterol medications as prescribed. She has a blood pressure cuff at home but does not check her bp regularly. She has bloodwork done every 3-6 months or more often if needed.     Expected Outcomes  Short: start checking blood pressure at home and bring cuff in to verify accuracy. She had not completed this goal from last review but wants to. Long: Continue her current lifestyle changes to reduce weight and manage risk factors.        ITP Comments: ITP Comments    Row Name 05/29/18 1500 06/04/18 1351 07/02/18 0613 07/23/18 1150 07/30/18 1219   ITP Comments  Med Review completed. Initial ITP created. Diagnosis can be found in Care Everywhere 04/25/18  30 day review completed. ITP sent to Dr. Emily Filbert, Medical Director of Cardiac Rehab. Continue with ITP unless changes are made by physician.  30 day review. Continue with ITP unless directed changes by Medical Director chart review.  Doretha has been out sick for the past week.  Our program is currently closed due to COVID-19.  We are communicating with patient via phone calls and emails.      30 day review. Continue with ITP unless directed changes by Medical Director chart review.      Comments:

## 2018-08-06 IMAGING — CT CT HEAD W/O CM
4 series · 17 of 47 positions shown, 19 images · non-contrast
Comparison: None.

CLINICAL DATA: Hit head on the right side yesterday.  Frontal pain.

EXAM:
CT HEAD WITHOUT CONTRAST
TECHNIQUE: Contiguous axial images were obtained from the base of the skull
through the vertex without intravenous contrast.

[Series 2: head wo · axial · 0.40mm/px · z∈[-136,-26]mm · 7 of 30 slices shown, 9 images]
[im 4/30  brain]
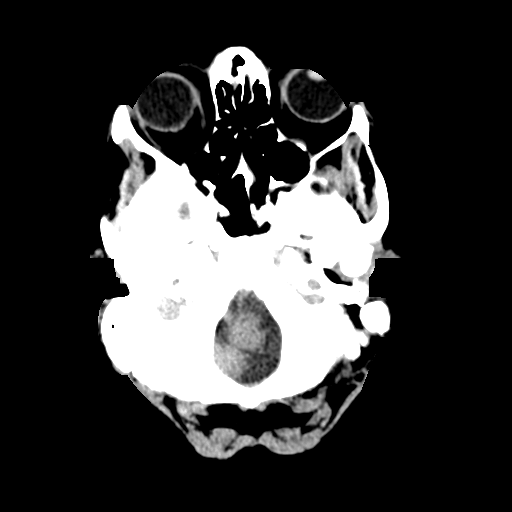
[im 4/30  bone]
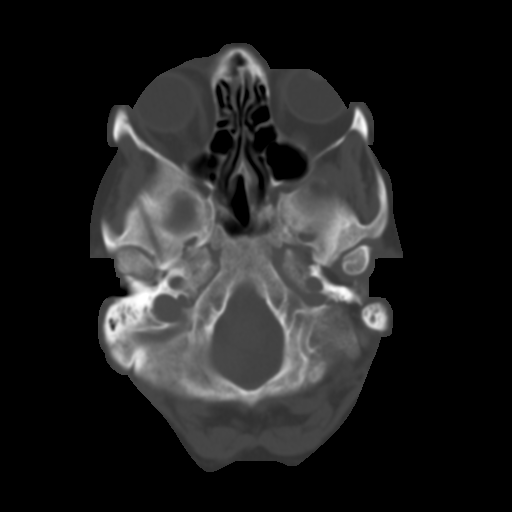
[im 8/30  brain]
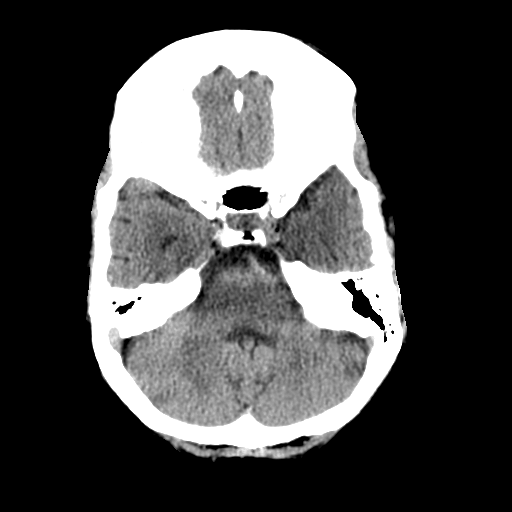
[im 11/30  brain]
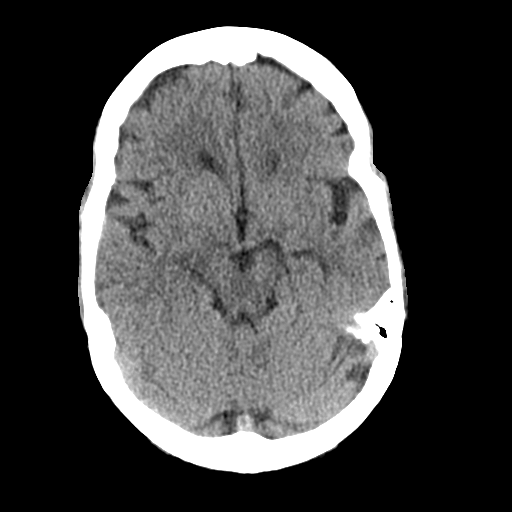
[im 15/30  brain]
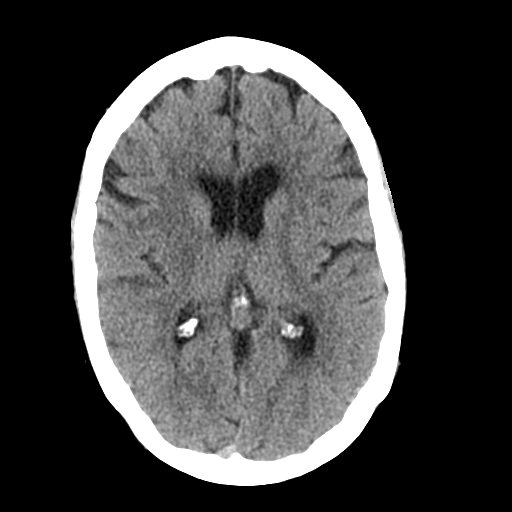
[im 19/30  brain]
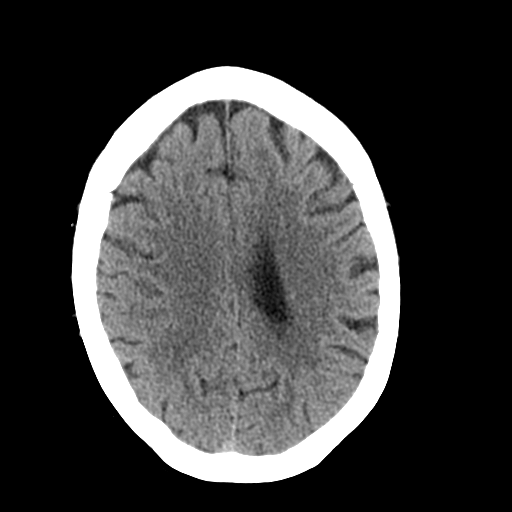
[im 19/30  bone]
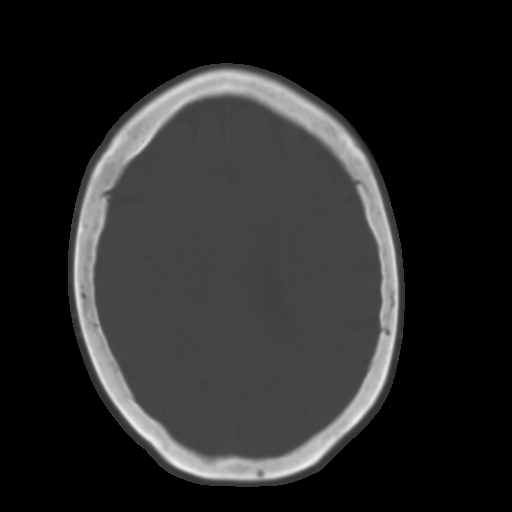
[im 22/30  brain]
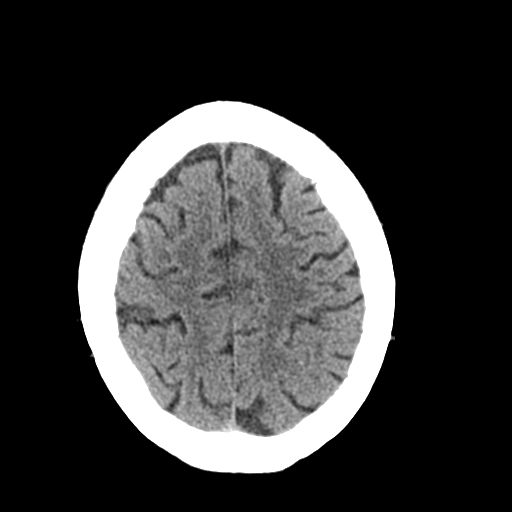
[im 26/30  brain]
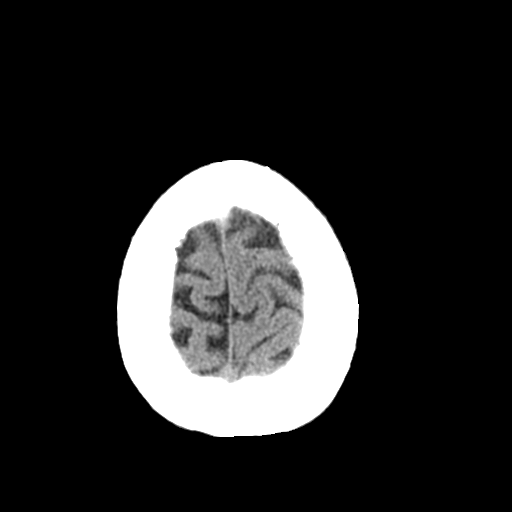

[Series 3: head bone · axial · 0.40mm/px · z∈[-137,-85]mm · 4 of 75 slices shown]
[im 8/75  bone]
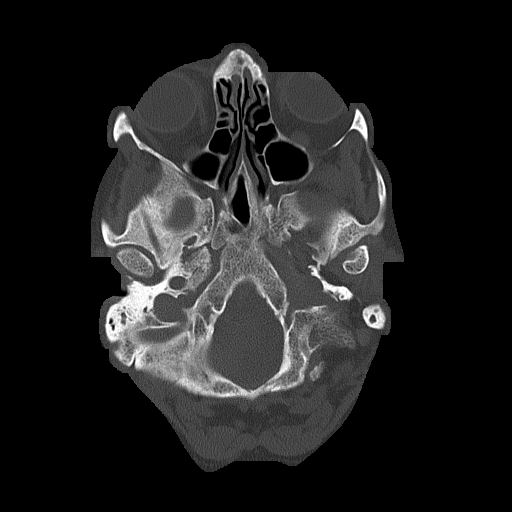
[im 15/75  bone]
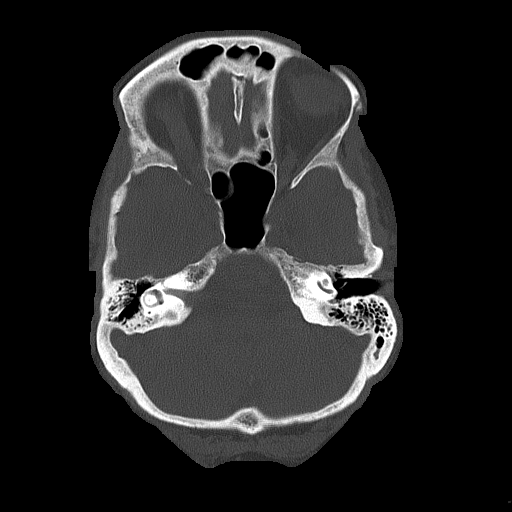
[im 23/75  bone]
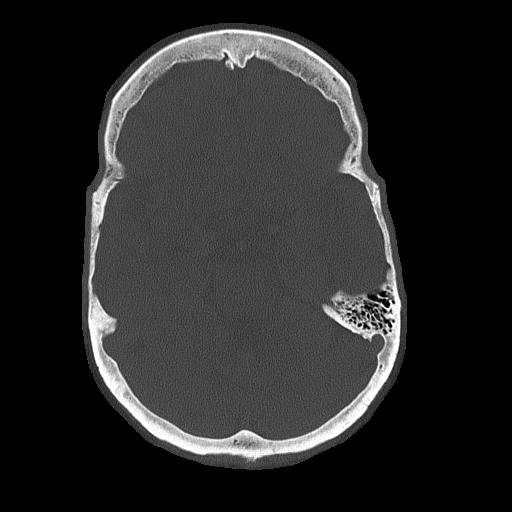
[im 34/75  bone]
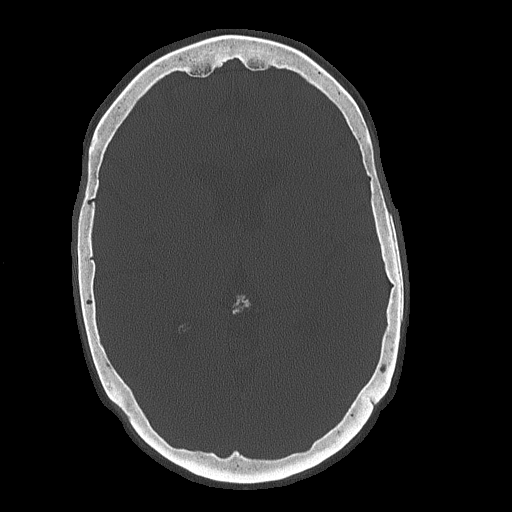

[Series 4: coronal soft tissue · coronal · 0.31mm/px · 3 of 68 slices shown]
[im 23/68  brain]
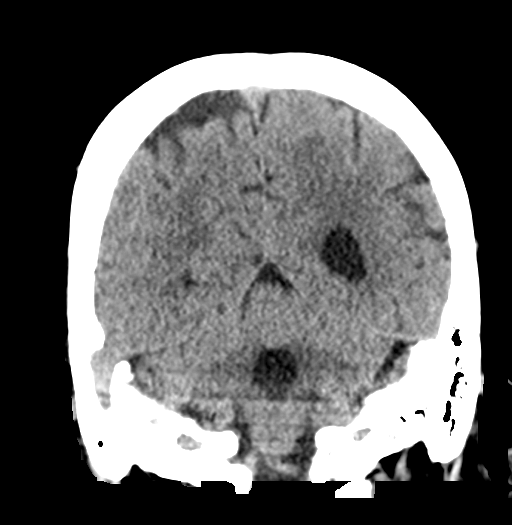
[im 30/68  brain]
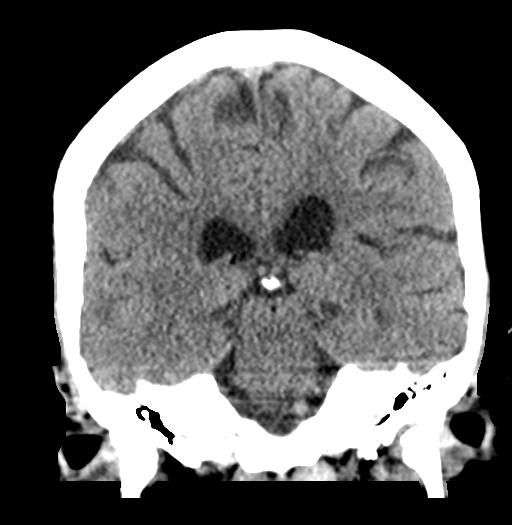
[im 38/68  brain]
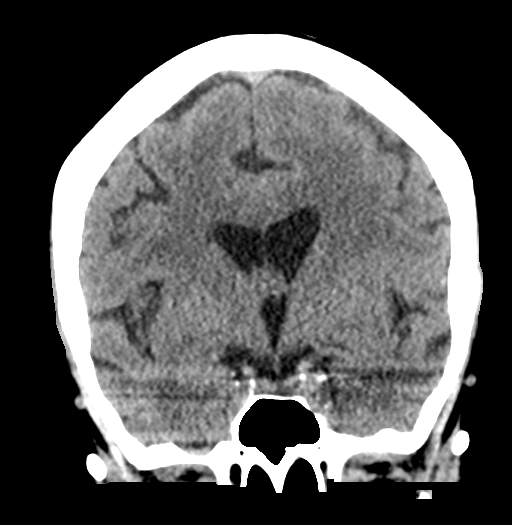

[Series 5: sagittal soft tissue · sagittal · 0.34mm/px · 3 of 54 slices shown]
[im 18/54  brain]
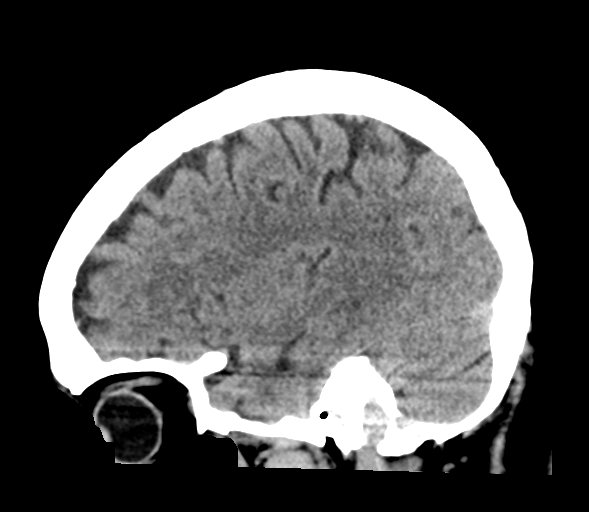
[im 27/54  brain]
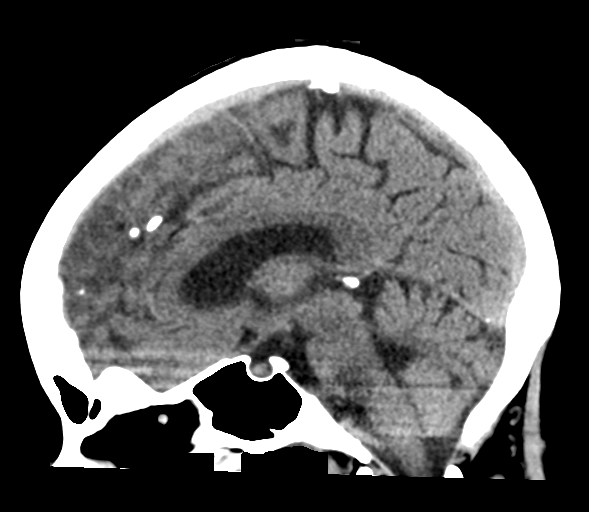
[im 36/54  brain]
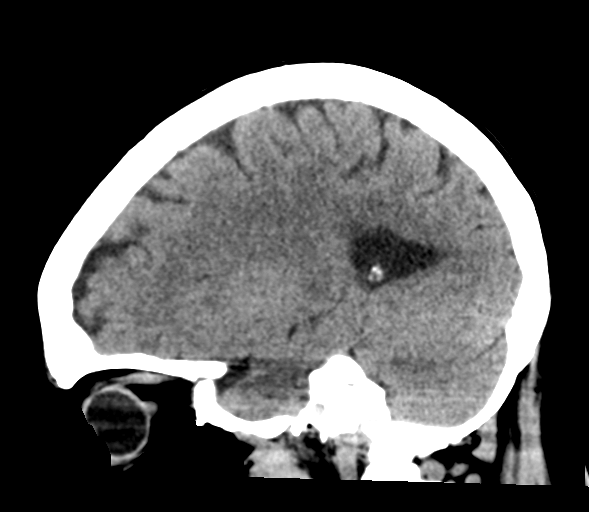

[17 of 47 positions shown; findings below may reference images not displayed]

FINDINGS: Brain: No evidence of acute infarction, hemorrhage, hydrocephalus,
extra-axial collection or mass lesion/mass effect. Small focus of
low attenuation in the anterior limb of the right internal capsule
likely reflecting a lacunar infarct of indeterminate age. Mild
periventricular white matter low attenuation as can be seen with
microvascular disease.

Vascular: No hyperdense vessel or unexpected calcification.

Skull: No osseous abnormality.

Sinuses/Orbits: Visualized paranasal sinuses are clear. Visualized
mastoid sinuses are clear. Visualized orbits demonstrate no focal
abnormality.

Other: None
IMPRESSION: 1. Small focus of low attenuation in the anterior limb of the right
internal capsule likely reflecting a lacunar infarct of
indeterminate age.
2. Otherwise, no acute intracranial pathology.

## 2018-08-11 ENCOUNTER — Encounter: Payer: Self-pay | Admitting: *Deleted

## 2018-08-11 DIAGNOSIS — Z9861 Coronary angioplasty status: Secondary | ICD-10-CM

## 2018-08-11 DIAGNOSIS — Z955 Presence of coronary angioplasty implant and graft: Secondary | ICD-10-CM

## 2018-09-01 ENCOUNTER — Telehealth: Payer: Self-pay | Admitting: *Deleted

## 2018-09-01 ENCOUNTER — Encounter: Payer: Self-pay | Admitting: *Deleted

## 2018-09-01 DIAGNOSIS — Z9861 Coronary angioplasty status: Secondary | ICD-10-CM

## 2018-09-01 DIAGNOSIS — Z955 Presence of coronary angioplasty implant and graft: Secondary | ICD-10-CM

## 2018-09-01 NOTE — Telephone Encounter (Signed)
Called to check on patient.  Left message, sent email.  

## 2018-09-22 ENCOUNTER — Encounter: Payer: Self-pay | Admitting: *Deleted

## 2018-09-22 DIAGNOSIS — Z9861 Coronary angioplasty status: Secondary | ICD-10-CM

## 2018-09-22 DIAGNOSIS — Z955 Presence of coronary angioplasty implant and graft: Secondary | ICD-10-CM

## 2018-10-14 ENCOUNTER — Encounter: Payer: Self-pay | Admitting: *Deleted

## 2018-10-14 DIAGNOSIS — Z955 Presence of coronary angioplasty implant and graft: Secondary | ICD-10-CM

## 2018-10-14 DIAGNOSIS — Z9861 Coronary angioplasty status: Secondary | ICD-10-CM

## 2018-11-17 ENCOUNTER — Other Ambulatory Visit: Payer: Self-pay

## 2018-11-17 ENCOUNTER — Encounter: Payer: Medicare Other | Attending: Cardiology

## 2018-11-17 DIAGNOSIS — Z955 Presence of coronary angioplasty implant and graft: Secondary | ICD-10-CM | POA: Insufficient documentation

## 2018-11-17 DIAGNOSIS — Z9861 Coronary angioplasty status: Secondary | ICD-10-CM

## 2018-11-17 NOTE — Progress Notes (Signed)
Daily Session Note  Patient Details  Name: Monica Bush MRN: [id number] Date of Birth: December 09, 1940 Referring Provider:     Cardiac Rehab from 05/29/2018 in [facility] Cardiac and Pulmonary Rehab  Referring Provider  kelsey      Encounter Date: 11/17/2018  Check In: Session Check In - 11/17/18 1441      Check-In   Supervising physician immediately available to respond to emergencies  See telemetry face sheet for immediately available ER MD    Location  [facility]-Cardiac & Pulmonary Rehab    Staff Present  Earlean Shawl, BS, ACSM CEP, Exercise Physiologist;Joseph Flavia Shipper;Heath Lark, RN, BSN, CCRP    Virtual Visit  No    Medication changes reported      No    Fall or balance concerns reported     No    Warm-up and Cool-down  Performed on first and last piece of equipment    Resistance Training Performed  Yes    VAD Patient?  No    PAD/SET Patient?  No      Pain Assessment   Currently in Pain?  No/denies    Multiple Pain Sites  No          Social History   Tobacco Use  Smoking Status Never Smoker  Smokeless Tobacco Never Used    Goals Met:  Independence with exercise equipment Exercise tolerated well No report of cardiac concerns or symptoms Strength training completed today  Goals Unmet:  Not Applicable  Comments: Pt able to follow exercise prescription today without complaint.  Will continue to monitor for progression.    Dr. Emily Filbert is Medical Director for [place] [facility].

## 2018-11-19 ENCOUNTER — Other Ambulatory Visit: Payer: Self-pay

## 2018-11-19 ENCOUNTER — Encounter: Payer: Self-pay | Admitting: *Deleted

## 2018-11-19 ENCOUNTER — Encounter: Payer: Medicare Other | Admitting: *Deleted

## 2018-11-19 DIAGNOSIS — Z9861 Coronary angioplasty status: Secondary | ICD-10-CM

## 2018-11-19 DIAGNOSIS — Z955 Presence of coronary angioplasty implant and graft: Secondary | ICD-10-CM

## 2018-11-19 NOTE — Progress Notes (Signed)
Daily Session Note  Patient Details  Name: Monica Bush MRN: [id number] Date of Birth: 1941-04-11 Referring Provider:     Cardiac Rehab from 05/29/2018 in [facility] Cardiac and Pulmonary Rehab  Referring Provider  kelsey      Encounter Date: 11/19/2018  Check In: Session Check In - 11/19/18 1411      Check-In   Supervising physician immediately available to respond to emergencies  See telemetry face sheet for immediately available ER MD    Location  [facility]-Cardiac & Pulmonary Rehab    Staff Present  Renita Papa, RN BSN;Joseph Tessie Fass RCP,RRT,BSRT    Virtual Visit  No    Medication changes reported      No    Fall or balance concerns reported     No    Warm-up and Cool-down  Performed on first and last piece of equipment    Resistance Training Performed  Yes    VAD Patient?  No    PAD/SET Patient?  No      Pain Assessment   Currently in Pain?  No/denies          Social History   Tobacco Use  Smoking Status Never Smoker  Smokeless Tobacco Never Used    Goals Met:  Independence with exercise equipment Exercise tolerated well No report of cardiac concerns or symptoms Strength training completed today  Goals Unmet:  Not Applicable  Comments: Pt able to follow exercise prescription today without complaint.  Will continue to monitor for progression.    Dr. Emily Filbert is Medical Director for [place] [facility].

## 2018-11-19 NOTE — Progress Notes (Signed)
Cardiac Individual Treatment Plan  Patient Details  Name: Monica Bush MRN: [id number] Date of Birth: 1940-06-06 Referring Provider:     Cardiac Rehab from 05/29/2018 in [facility]iac [facility]  Referring Provider  kelsey      Initial Encounter Date:    Cardiac Rehab from 05/29/2018 in [facility] Cardiac and Pulmonary Rehab  Date  05/29/18      Visit Diagnosis: S/P PTCA (percutaneous transluminal coronary angioplasty)  Status post coronary artery stent placement  Patient's Home Medications on Admission:  Current Outpatient Medications:  .  amLODipine (NORVASC) 10 MG tablet, Take 10 mg by mouth daily., Disp: , Rfl:  .  aspirin 81 MG chewable tablet, Chew 81 mg by mouth daily., Disp: , Rfl:  .  atorvastatin (LIPITOR) 40 MG tablet, Take 40 mg by mouth daily., Disp: , Rfl:  .  BRILINTA 90 MG TABS tablet, , Disp: , Rfl:  .  chlorpheniramine (CHLOR-TRIMETON) 4 MG tablet, Take by mouth., Disp: , Rfl:  .  Cholecalciferol (VITAMIN D3) 25 MCG (1000 UT) CAPS, Take by mouth., Disp: , Rfl:  .  fluticasone (FLONASE) 50 MCG/ACT nasal spray, Place into the nose., Disp: , Rfl:  .  metroNIDAZOLE (METROGEL) 1 % gel, Apply topically., Disp: , Rfl:  .  nitroGLYCERIN (NITROSTAT) 0.4 MG SL tablet, , Disp: , Rfl:   Past Medical History: Past Medical History:  Diagnosis Date  . Anxiety   . Asthma   . CAD (coronary artery disease)   . Hyperlipemia   . Hypertension   . Osteopetrosis   . Reflux esophagitis     Tobacco Use: Social History   Tobacco Use  Smoking Status Never Smoker  Smokeless Tobacco Never Used    Labs: Recent Review Flowsheet Data    There is no flowsheet data to display.       Exercise Target Goals: Exercise Program Goal: Individual exercise prescription set using results from initial 6 min walk test and THRR while considering  patient's activity barriers and safety.   Exercise Prescription Goal: Initial exercise prescription builds to 30-45 minutes a day of  aerobic activity, 2-3 days per week.  Home exercise guidelines will be given to patient during program as part of exercise prescription that the participant will acknowledge.  Activity Barriers & Risk Stratification:   6 Minute Walk: 6 Minute Walk    Row Name 05/29/18 1447         6 Minute Walk   Phase  Initial     Distance  1228 feet     Walk Time  6 minutes     # of Rest Breaks  0     MPH  2.33     METS  2.31     RPE  11     VO2 Peak  8.08     Symptoms  No     Resting HR  72 bpm     Resting BP  124/70     Resting Oxygen Saturation   98 %     Exercise Oxygen Saturation  during 6 min walk  99 %     Max Ex. HR  112 bpm     Max Ex. BP  124/66     2 Minute Post BP  122/66        Oxygen Initial Assessment:   Oxygen Re-Evaluation:   Oxygen Discharge (Final Oxygen Re-Evaluation):   Initial Exercise Prescription: Initial Exercise Prescription - 05/29/18 1400      Date of Initial  Exercise RX and Referring Provider   Date  05/29/18    Referring Provider  kelsey      Treadmill   MPH  1.7    Grade  0    Minutes  15    METs  2.3      NuStep   Level  2    SPM  80    Minutes  15    METs  2.3      Biostep-RELP   Level  2    SPM  50    Minutes  15    METs  2      Prescription Details   Frequency (times per week)  3    Duration  Progress to 30 minutes of continuous aerobic without signs/symptoms of physical distress      Intensity   THRR 40-80% of Max Heartrate  101-130    Ratings of Perceived Exertion  11-15    Perceived Dyspnea  0-4      Resistance Training   Training Prescription  Yes    Weight  3 lb    Reps  10-15       Perform Capillary Blood Glucose checks as needed.  Exercise Prescription Changes: Exercise Prescription Changes    Row Name 05/29/18 1400 06/10/18 1400 06/25/18 1600 07/09/18 1400 07/23/18 1100     Response to Exercise   Blood Pressure (Admit)  124/70  130/62  126/64  110/56  136/70   Blood Pressure (Exercise)  124/66  138/74   144/64  124/70  122/62   Blood Pressure (Exit)  122/66  124/62  118/66  128/70  122/62   Heart Rate (Admit)  72 bpm  75 bpm  82 bpm  85 bpm  63 bpm   Heart Rate (Exercise)  112 bpm  118 bpm  122 bpm  112 bpm  109 bpm   Heart Rate (Exit)  75 bpm  83 bpm  90 bpm  76 bpm  74 bpm   Oxygen Saturation (Admit)  98 %  -  -  -  -   Oxygen Saturation (Exercise)  99 %  -  -  -  -   Rating of Perceived Exertion (Exercise)  '11  11  11  12  13   ' Symptoms  -  none  none  none  none   Comments  -  second full day of exercise  -  -  -   Duration  -  Continue with 30 min of aerobic exercise without signs/symptoms of physical distress.  Continue with 30 min of aerobic exercise without signs/symptoms of physical distress.  Continue with 30 min of aerobic exercise without signs/symptoms of physical distress.  Continue with 30 min of aerobic exercise without signs/symptoms of physical distress.   Intensity  -  THRR unchanged  THRR unchanged  THRR unchanged  THRR unchanged     Progression   Progression  -  Continue to progress workloads to maintain intensity without signs/symptoms of physical distress.  Continue to progress workloads to maintain intensity without signs/symptoms of physical distress.  Continue to progress workloads to maintain intensity without signs/symptoms of physical distress.  Continue to progress workloads to maintain intensity without signs/symptoms of physical distress.   Average METs  -  2.21  3.26  2.59  2.59     Resistance Training   Training Prescription  -  Yes  Yes  Yes  Yes   Weight  -  3 lbs  3  lbs  3 lbs  3 lbs   Reps  -  10-15  10-15  10-15  10-15     Interval Training   Interval Training  -  No  No  No  No     Treadmill   MPH  -  1.7  2.3  2.3  2.3   Grade  -  0  '1  1  1   ' Minutes  -  '15  15  15  15   ' METs  -  2.3  3.08  3.08  3.08     NuStep   Level  -  '2  2  2  4   ' Minutes  -  '15  15  15  15   ' METs  -  2.1  2.7  2.7  2.7     Biostep-RELP   Level  -  '2  3  3  3    ' Minutes  -  '15  15  15  15   ' METs  -  '2  4  2  2     ' Home Exercise Plan   Plans to continue exercise at  -  -  [employer] (comment) walking and [employer] (comment) walking and [employer] (comment) walking and [employer]   Frequency  -  -  Add 2 additional days to program exercise sessions.  Add 2 additional days to program exercise sessions.  Add 2 additional days to program exercise sessions.   Initial Home Exercises Provided  -  -  06/19/18  06/19/18  06/19/18      Exercise Comments: Exercise Comments    Row Name 06/05/18 [phone]           Exercise Comments  First full day of exercise!  Patient was oriented to gym and equipment including functions, settings, policies, and procedures.  Patient's individual exercise prescription and treatment plan were reviewed.  All starting workloads were established based on the results of the 6 minute walk test done at initial orientation visit.  The plan for exercise progression was also introduced and progression will be customized based on patient's performance and goals.          Exercise Goals and Review: Exercise Goals    Row Name 05/29/18 1446             Exercise Goals   Increase Physical Activity  Yes       Intervention  Provide advice, education, support and counseling about physical activity/exercise needs.;Develop an individualized exercise prescription for aerobic and resistive training based on initial evaluation findings, risk stratification, comorbidities and participant's personal goals.       Expected Outcomes  Short Term: Attend rehab on a regular basis to increase amount of physical activity.;Long Term: Add in home exercise to make exercise part of routine and to increase amount of physical activity.;Long Term: Exercising regularly at least 3-5 days a week.       Increase Strength and Stamina  Yes       Intervention  Provide advice, education, support and counseling  about physical activity/exercise needs.;Develop an individualized exercise prescription for aerobic and resistive training based on initial evaluation findings, risk stratification, comorbidities and participant's personal goals.       Expected Outcomes  Short Term: Increase workloads from initial exercise prescription for resistance, speed, and METs.;Short Term: Perform resistance training exercises routinely during rehab and add in resistance training at home;Long Term: Improve  cardiorespiratory fitness, muscular endurance and strength as measured by increased METs and functional capacity (6MWT)       Able to understand and use rate of perceived exertion (RPE) scale  Yes       Intervention  Provide education and explanation on how to use RPE scale       Expected Outcomes  Short Term: Able to use RPE daily in rehab to express subjective intensity level;Long Term:  Able to use RPE to guide intensity level when exercising independently       Knowledge and understanding of Target Heart Rate Range (THRR)  Yes       Intervention  Provide education and explanation of THRR including how the numbers were predicted and where they are located for reference       Expected Outcomes  Short Term: Able to use daily as guideline for intensity in rehab;Short Term: Able to state/look up THRR;Long Term: Able to use THRR to govern intensity when exercising independently       Able to check pulse independently  Yes       Intervention  Provide education and demonstration on how to check pulse in carotid and radial arteries.;Review the importance of being able to check your own pulse for safety during independent exercise       Expected Outcomes  Short Term: Able to explain why pulse checking is important during independent exercise;Long Term: Able to check pulse independently and accurately       Understanding of Exercise Prescription  Yes       Intervention  Provide education, explanation, and written materials on patient's  individual exercise prescription       Expected Outcomes  Short Term: Able to explain program exercise prescription;Long Term: Able to explain home exercise prescription to exercise independently          Exercise Goals Re-Evaluation : Exercise Goals Re-Evaluation    Row Name 06/05/18 7169 06/10/18 1429 06/19/18 0955 06/25/18 1602 07/08/18 1039     Exercise Goal Re-Evaluation   Exercise Goals Review  Increase Physical Activity;Increase Strength and Stamina;Able to understand and use rate of perceived exertion (RPE) scale;Knowledge and understanding of Target Heart Rate Range (THRR);Understanding of Exercise Prescription  Increase Physical Activity;Increase Strength and Stamina;Understanding of Exercise Prescription  Increase Physical Activity;Increase Strength and Stamina;Understanding of Exercise Prescription;Able to understand and use rate of perceived exertion (RPE) scale;Knowledge and understanding of Target Heart Rate Range (THRR);Able to check pulse independently  Increase Physical Activity;Increase Strength and Stamina;Understanding of Exercise Prescription  Increase Physical Activity;Able to understand and use rate of perceived exertion (RPE) scale;Knowledge and understanding of Target Heart Rate Range (THRR);Understanding of Exercise Prescription;Increase Strength and Stamina;Able to check pulse independently   Comments  Reviewed RPE scale, THR and program prescription with pt today.  Pt voiced understanding and was given a copy of goals to take home.   Travis is off to a good start in rehab.  She has completed her first two full days of exercise.  She was wanting to get on her third piece today!!  We will start to increase her workloads and montior her progress.   Tonda is doing well in rehab.  She already feels like her strength and stamina are coming back.  She is already going to gym [employer]) for 30 min on the treadmill.  Reviewed home exercise with pt today.  Pt plans to walking and  going to [employer] for exercise.  Reviewed THR, pulse, RPE, sign and symptoms,  NTG use, and when to call 911 or MD.  Also discussed weather considerations and indoor options.  Pt voiced understanding.  Sonni is doing well in rehab.  She is up to 2.3 mph on the treadmill.  We will continue to monitor her progress.   Braeden has noticed that she can do more at home since starting Cardiac Rehab. She is up to Level 4 on the Nustep. She is going to [employer] 3 days per week in addition to Cardiac Rehab. She is encouraging her niece to exercise as well.    Expected Outcomes  Short: Use RPE daily to regulate intensity. Long: Follow program prescription in THR.  Short: Increase workloads.  Long; Continue to follow program prescription.   Short: Start to add in more exercise at home.  Long: Continue to increase strength and stamina.   Short: Increase workloads.  Long: Continue to increase strength and stamina.   Short: Add hand weights one day per week at [employer]. Long: Continue with exercising at [employer] and graduate from the program. Progressing to finding hills easier.    [place] Name 07/23/18 1151 08/11/18 1135 09/22/18 1305 10/14/18 1212       Exercise Goal Re-Evaluation   Exercise Goals Review  Increase Physical Activity;Increase Strength and Stamina;Understanding of Exercise Prescription  Increase Physical Activity;Increase Strength and Stamina;Understanding of Exercise Prescription  Increase Physical Activity;Increase Strength and Stamina;Understanding of Exercise Prescription  Increase Physical Activity;Increase Strength and Stamina;Understanding of Exercise Prescription    Comments  Hien has only had one visit since last review as she has been out sick.  She will be walking at home.  We will continue to monitor her progress at home.   Carmaleta is doing well at home.  She is exercsing everyday. She goes for a walk with a friend from church, maintaining distance, to get out of the house.  She  is also using our videos each day.  She has pushed her dining room table out of the way and using weights as well.   We will continue to monitor her progress  Semiyah has been doing her walking still, but it is starting to hurt her hip. She has been heating and icing her hip; we also talked about some options to help stretch that area.  She is also using our videos and enjoys watching them.  Seh continues to get in her weights as well.   Shadoe is doing well.  She is feeling better, the hip stretches really made a difference and she is back to walking again!!    Expected Outcomes  Short: Continue to walk at home.  Long: Continue to increase strength and stamina.   Short: Continue to walk and use videos each day.  Long: Continue to stay active at home.   Short: Stretch out hip more.  Long: Continue to stay active using videos.   Short: Continue to stretch hip.  Long: Continue to walk and use videos       Discharge Exercise Prescription (Final Exercise Prescription Changes): Exercise Prescription Changes - 07/23/18 1100      Response to Exercise   Blood Pressure (Admit)  136/70    Blood Pressure (Exercise)  122/62    Blood Pressure (Exit)  122/62    Heart Rate (Admit)  63 bpm    Heart Rate (Exercise)  109 bpm    Heart Rate (Exit)  74 bpm    Rating of Perceived Exertion (Exercise)  13    Symptoms  none    Duration  Continue with 30 min of aerobic exercise without signs/symptoms of physical distress.    Intensity  THRR unchanged      Progression   Progression  Continue to progress workloads to maintain intensity without signs/symptoms of physical distress.    Average METs  2.59      Resistance Training   Training Prescription  Yes    Weight  3 lbs    Reps  10-15      Interval Training   Interval Training  No      Treadmill   MPH  2.3    Grade  1    Minutes  15    METs  3.08      NuStep   Level  4    Minutes  15    METs  2.7      Biostep-RELP   Level  3    Minutes  15    METs  2       Home Exercise Plan   Plans to continue exercise at  [employer] (comment)   walking and [employer]   Frequency  Add 2 additional days to program exercise sessions.    Initial Home Exercises Provided  06/19/18       Nutrition:  Target Goals: Understanding of nutrition guidelines, daily intake of sodium <1517m, cholesterol <2074m calories 30% from fat and 7% or less from saturated fats, daily to have 5 or more servings of fruits and vegetables.  Biometrics: Pre Biometrics - 05/29/18 1446      Pre Biometrics   Height  5' 1.5" (1.562 m)    Weight  163 lb 4.8 oz (74.1 kg)    Waist Circumference  35 inches    Hip Circumference  43 inches    Waist to Hip Ratio  0.81 %    BMI (Calculated)  30.36    Single Leg Stand  10.68 seconds        Nutrition Therapy Plan and Nutrition Goals: Nutrition Therapy & Goals - 05/29/18 1522      Intervention Plan   Intervention  Prescribe, educate and counsel regarding individualized specific dietary modifications aiming towards targeted core components such as weight, hypertension, lipid management, diabetes, heart failure and other comorbidities.;Nutrition handout(s) given to patient.    Expected Outcomes  Short Term Goal: Understand basic principles of dietary content, such as calories, fat, sodium, cholesterol and nutrients.;Long Term Goal: Adherence to prescribed nutrition plan.;Short Term Goal: A plan has been developed with personal nutrition goals set during dietitian appointment.       Nutrition Assessments: Nutrition Assessments - 05/29/18 1522      MEDFICTS Scores   Pre Score  18       Nutrition Goals Re-Evaluation: Nutrition Goals Re-Evaluation    Row Name 06/19/18 1014 07/08/18 1051           Goals   Current Weight  -  160 lb (72.6 kg)      Nutrition Goal  Heart healthy, more fruits and vegetables, read food labels, low salt diet.   -      Comment  NaDrendaas been reading food labels and watching her diet.   She has started to limit her soduim and noticed how much is in food.  She is trying to more fruits and vegetables wiht fresh fruit and V8 juice.  While she was sick, she had some chicken soup.  She is working her way back to black decafe coffee.  She is doing well with lean proteins aiming for fish 1-2x week and chicken.  She does like beef and has the occasional  and will try to get some of the lower fat options.   Sheri is eating a heart healthy diet. She makes a spinach omelet for breakfast. She eats salad for lunch. She has steak or salmon cut into small servings for protein with her salad. She avoids sweets. She is trying to stop using creamer in her coffee. She cooks in small portions so she has no leftovers or doesnt eat too much.       Expected Outcome  Short: Continue to work on diet.  Long: Continue to use new seasoning options to avoid salt.   Short: Continue healthy eating and making small changes. Long: Continue cooking small servings since she lives alone.         Nutrition Goals Discharge (Final Nutrition Goals Re-Evaluation): Nutrition Goals Re-Evaluation - 07/08/18 1051      Goals   Current Weight  160 lb (72.6 kg)    Comment  Saiya is eating a heart healthy diet. She makes a spinach omelet for breakfast. She eats salad for lunch. She has steak or salmon cut into small servings for protein with her salad. She avoids sweets. She is trying to stop using creamer in her coffee. She cooks in small portions so she has no leftovers or doesnt eat too much.     Expected Outcome  Short: Continue healthy eating and making small changes. Long: Continue cooking small servings since she lives alone.       Psychosocial: Target Goals: Acknowledge presence or absence of significant depression and/or stress, maximize coping skills, provide positive support system. Participant is able to verbalize types and ability to use techniques and skills needed for reducing stress and depression.   Initial  Review & Psychosocial Screening: Initial Psych Review & Screening - 05/29/18 1520      Initial Review   Current issues with  Current Stress Concerns    Source of Stress Concerns  Unable to perform yard/household activities      Elizabeth?  Yes   friends     Barriers   Psychosocial barriers to participate in program  There are no identifiable barriers or psychosocial needs.;The patient should benefit from training in stress management and relaxation.      Screening Interventions   Interventions  Encouraged to exercise;Program counselor consult;Provide feedback about the scores to participant;To provide support and resources with identified psychosocial needs    Expected Outcomes  Short Term goal: Utilizing psychosocial counselor, staff and physician to assist with identification of specific Stressors or current issues interfering with healing process. Setting desired goal for each stressor or current issue identified.;Long Term Goal: Stressors or current issues are controlled or eliminated.;Short Term goal: Identification and review with participant of any Quality of Life or Depression concerns found by scoring the questionnaire.;Long Term goal: The participant improves quality of Life and PHQ9 Scores as seen by post scores and/or verbalization of changes       Quality of Life Scores:  Quality of Life - 05/29/18 1522      Quality of Life   Select  Quality of Life      Quality of Life Scores   Health/Function Pre  24.43 %    Socioeconomic Pre  27.92 %    Psych/Spiritual Pre  18 %    Family Pre  24 %  GLOBAL Pre  23.55 %      Scores of 19 and below usually indicate a poorer quality of life in these areas.  A difference of  2-3 points is a clinically meaningful difference.  A difference of 2-3 points in the total score of the Quality of Life Index has been associated with significant improvement in overall quality of life, self-image, physical symptoms, and  general health in studies assessing change in quality of life.  PHQ-9: Recent Review Flowsheet Data    Depression screen [facility] 2/9 05/29/2018   Decreased Interest 0   Down, Depressed, Hopeless 0   PHQ - 2 Score 0   Altered sleeping 0   Tired, decreased energy 1   Change in appetite 1   Feeling bad or failure about yourself  1   Trouble concentrating 0   Moving slowly or fidgety/restless 0   Suicidal thoughts 0   PHQ-9 Score 3   Difficult doing work/chores Somewhat difficult     Interpretation of Total Score  Total Score Depression Severity:  1-4 = Minimal depression, 5-9 = Mild depression, 10-14 = Moderate depression, 15-19 = Moderately severe depression, 20-27 = Severe depression   Psychosocial Evaluation and Intervention: Psychosocial Evaluation - 06/11/18 0956      Psychosocial Evaluation & Interventions   Interventions  Stress management education;Encouraged to exercise with the program and follow exercise prescription    Comments  Counselor met with Ms. Aggie Cosier Izora Gala) today for initial psychosocial evaluation.  She is a 78 year old who had (2) stents inserted on 04/24/18.  Decarla lives alone and has a limited support system with some neighbors and friends close by.  She sleeps well most of the time and admits to some comfort eating which makes it difficult to lose weight.  Renezmae denies a history of depression or anxiety or any current symptoms and is typically in a positive mood most of the time.  She has some stress in her life with her health issues; recent mold problems in her home; finances and several "high maintenance' friends.  Counselor discussed self-care and setting healthy boundaries with her.  She has goals to increase her stamina and strength and to improve her diet while in this program.  Staff will follow with her.    Expected Outcomes  Short:  Meili will exercise consistently to increase her stamina and strength and to manage her stress more positively.  She will meet  with the dietician to address her healthier eating goals and to learn alternative ways to cope rather than comfort eat.  Long:  Jinnifer will develop a routine of healthy lifestyle habits for her health and mental health - to cope with stres more positively.      Continue Psychosocial Services   Follow up required by staff       Psychosocial Re-Evaluation: Psychosocial Re-Evaluation    [place] Name 07/08/18 1044 08/11/18 1140 09/22/18 1307 10/14/18 1214       Psychosocial Re-Evaluation   Current issues with  Current Stress Concerns;Current Sleep Concerns  Current Stress Concerns  Current Stress Concerns  Current Stress Concerns    Comments  Kaleea has a positive outlook. She reads for fun. Clutter in her home is a stress concern. She is working on de-cluttering to relieve stress and using goal settting and checklists to get this done by pacing herself. She has noticed she sleeps better on exercise days. Encouraged to nap if she doesn't sleep well and has time for it earlier in  the day. She has long distance friends and family that she talks to daily. She is especially close to her niece in [place]. They discuss heart disease and managing their risk factors.   Nadege has continued to do well at home.  She is getting out to exercise and staying positvie.  She has enjoyed the calls and videos to stay connected.    She is limiting her outings to get the essentials and lower her exposure risk.  She has found that [employer] chocolate chips helps her get her chocolate fix at less caloreies.   Janelle has been doing well at home.  She is currently out on a hunt for more toilet paper.  She said that [employer] out in 30 minutes yesterday! She is still limiting her outings for essentials.  She is also still stress eating.  After we talked last time, she got very sick for a couple of weeks and was even tested for COVID-19 but was negative.  She is now feeling better.   Trinia has been doing good.  She has been working  out in her yard and walking again.  She is back to walking with her neighbor and checking on them.     Expected Outcomes  Short: Continue to de-clutter and find enjoyable activities to do for self-care. Long: Use relaxation techniques to de-stress and get to sleep when she can.  Short: Continue to get exercise  Long: Continue to stay postive.   Short: Continue to recover.  Long: Continue to stay postive.   Short: Continue to connect with neighbors.  Long: Continue to stay positive.     Interventions  -  Encouraged to attend Cardiac Rehabilitation for the exercise  Encouraged to attend Cardiac Rehabilitation for the exercise  -    Continue Psychosocial Services   -  Follow up required by staff  Follow up required by staff  -       Psychosocial Discharge (Final Psychosocial Re-Evaluation): Psychosocial Re-Evaluation - 10/14/18 1214      Psychosocial Re-Evaluation   Current issues with  Current Stress Concerns    Comments  Adelin has been doing good.  She has been working out in her yard and walking again.  She is back to walking with her neighbor and checking on them.     Expected Outcomes  Short: Continue to connect with neighbors.  Long: Continue to stay positive.        Vocational Rehabilitation: Provide vocational rehab assistance to qualifying candidates.   Vocational Rehab Evaluation & Intervention: Vocational Rehab - 05/29/18 1520      Initial Vocational Rehab Evaluation & Intervention   Assessment shows need for Vocational Rehabilitation  No       Education: Education Goals: Education classes will be provided on a variety of topics geared toward better understanding of heart health and risk factor modification. Participant will state understanding/return demonstration of topics presented as noted by education test scores.  Learning Barriers/Preferences: Learning Barriers/Preferences - 05/29/18 1518      Learning Barriers/Preferences   Learning Barriers  None    Learning  Preferences  Individual Instruction       Education Topics:  AED/CPR: - Group verbal and written instruction with the use of models to demonstrate the basic use of the AED with the basic ABC's of resuscitation.   General Nutrition Guidelines/Fats and Fiber: -Group instruction provided by verbal, written material, models and posters to present the general guidelines for heart healthy nutrition. Gives an  explanation and review of dietary fats and fiber.   Controlling Sodium/Reading Food Labels: -Group verbal and written material supporting the discussion of sodium use in heart healthy nutrition. Review and explanation with models, verbal and written materials for utilization of the food label.   Exercise Physiology & General Exercise Guidelines: - Group verbal and written instruction with models to review the exercise physiology of the cardiovascular system and associated critical values. Provides general exercise guidelines with specific guidelines to those with heart or lung disease.    Aerobic Exercise & Resistance Training: - Gives group verbal and written instruction on the various components of exercise. Focuses on aerobic and resistive training programs and the benefits of this training and how to safely progress through these programs..   Flexibility, Balance, Mind/Body Relaxation: Provides group verbal/written instruction on the benefits of flexibility and balance training, including mind/body exercise modes such as yoga, pilates and tai chi.  Demonstration and skill practice provided.   Stress and Anxiety: - Provides group verbal and written instruction about the health risks of elevated stress and causes of high stress.  Discuss the correlation between heart/lung disease and anxiety and treatment options. Review healthy ways to manage with stress and anxiety.   Cardiac Rehab from 07/10/2018 in [facility] Cardiac and Pulmonary Rehab  Date  06/24/18  Educator  [facility]  Instruction Review  Code  1- Verbalizes Understanding      Depression: - Provides group verbal and written instruction on the correlation between heart/lung disease and depressed mood, treatment options, and the stigmas associated with seeking treatment.   Cardiac Rehab from 07/10/2018 in [facility] Cardiac and Pulmonary Rehab  Date  07/08/18  Educator  [facility]  Instruction Review Code  1- Verbalizes Understanding      Anatomy & Physiology of the Heart: - Group verbal and written instruction and models provide basic cardiac anatomy and physiology, with the coronary electrical and arterial systems. Review of Valvular disease and Heart Failure   Cardiac Rehab from 07/10/2018 in [facility] Cardiac and Pulmonary Rehab  Date  06/12/18  Educator  CE  Instruction Review Code  1- Verbalizes Understanding      Cardiac Procedures: - Group verbal and written instruction to review commonly prescribed medications for heart disease. Reviews the medication, class of the drug, and side effects. Includes the steps to properly store meds and maintain the prescription regimen. (beta blockers and nitrates)   Cardiac Rehab from 07/10/2018 in [facility] Cardiac and Pulmonary Rehab  Date  06/26/18  Educator  CE  Instruction Review Code  1- Verbalizes Understanding      Cardiac Medications I: - Group verbal and written instruction to review commonly prescribed medications for heart disease. Reviews the medication, class of the drug, and side effects. Includes the steps to properly store meds and maintain the prescription regimen.   Cardiac Medications II: -Group verbal and written instruction to review commonly prescribed medications for heart disease. Reviews the medication, class of the drug, and side effects. (all other drug classes)   Cardiac Rehab from 07/10/2018 in [facility] Cardiac and Pulmonary Rehab  Date  07/10/18  Educator  CE  Instruction Review Code  1- Verbalizes Understanding       Go Sex-Intimacy & Heart Disease, Get SMART - Goal  Setting: - Group verbal and written instruction through game format to discuss heart disease and the return to sexual intimacy. Provides group verbal and written material to discuss and apply goal setting through the application of the S.M.A.R.T. Method.  Cardiac Rehab from 07/10/2018 in [facility] Cardiac and Pulmonary Rehab  Date  06/26/18  Educator  CE  Instruction Review Code  1- Verbalizes Understanding      Other Matters of the Heart: - Provides group verbal, written materials and models to describe Stable Angina and Peripheral Artery. Includes description of the disease process and treatment options available to the cardiac patient.   Exercise & Equipment Safety: - Individual verbal instruction and demonstration of equipment use and safety with use of the equipment.   Cardiac Rehab from 07/10/2018 in [facility] Cardiac and Pulmonary Rehab  Date  05/29/18  Educator  [facility]  Instruction Review Code  1- Verbalizes Understanding      Infection Prevention: - Provides verbal and written material to individual with discussion of infection control including proper hand washing and proper equipment cleaning during exercise session.   Cardiac Rehab from 07/10/2018 in [facility] Cardiac and Pulmonary Rehab  Date  05/29/18  Educator  [facility]  Instruction Review Code  1- Verbalizes Understanding      Falls Prevention: - Provides verbal and written material to individual with discussion of falls prevention and safety.   Cardiac Rehab from 07/10/2018 in [facility] Cardiac and Pulmonary Rehab  Date  05/29/18  Educator  [facility]  Instruction Review Code  1- Verbalizes Understanding      Diabetes: - Individual verbal and written instruction to review signs/symptoms of diabetes, desired ranges of glucose level fasting, after meals and with exercise. Acknowledge that pre and post exercise glucose checks will be done for 3 sessions at entry of program.   Know Your Numbers and Risk Factors: -Group verbal and written instruction  about important numbers in your health.  Discussion of what are risk factors and how they play a role in the disease process.  Review of Cholesterol, Blood Pressure, Diabetes, and BMI and the role they play in your overall health.   Cardiac Rehab from 07/10/2018 in [facility] Cardiac and Pulmonary Rehab  Date  07/10/18  Educator  CE  Instruction Review Code  1- Verbalizes Understanding      Sleep Hygiene: -Provides group verbal and written instruction about how sleep can affect your health.  Define sleep hygiene, discuss sleep cycles and impact of sleep habits. Review good sleep hygiene tips.    Cardiac Rehab from 07/10/2018 in [facility] Cardiac and Pulmonary Rehab  Date  06/10/18  Educator  [facility]  Instruction Review Code  1- Verbalizes Understanding      Other: -Provides group and verbal instruction on various topics (see comments)   Knowledge Questionnaire Score: Knowledge Questionnaire Score - 05/29/18 1518      Knowledge Questionnaire Score   Pre Score  23/26   correct answers reviewed with patient, focus on nutrition and exercise      Core Components/Risk Factors/Patient Goals at Admission: Personal Goals and Risk Factors at Admission - 05/29/18 1512      Core Components/Risk Factors/Patient Goals on Admission    Weight Management  Yes;Weight Loss    Intervention  Weight Management: Develop a combined nutrition and exercise program designed to reach desired caloric intake, while maintaining appropriate intake of nutrient and fiber, sodium and fats, and appropriate energy expenditure required for the weight goal.;Weight Management: Provide education and appropriate resources to help participant work on and attain dietary goals.;Weight Management/Obesity: Establish reasonable short term and long term weight goals.    Admit Weight  163 lb 4.8 oz (74.1 kg)    Goal Weight: Short Term  158 lb (71.7  kg)    Goal Weight: Long Term  143 lb (64.9 kg)    Expected Outcomes  Short Term: Continue to  assess and modify interventions until short term weight is achieved;Long Term: Adherence to nutrition and physical activity/exercise program aimed toward attainment of established weight goal;Weight Loss: Understanding of general recommendations for a balanced deficit meal plan, which promotes 1-2 lb weight loss per week and includes a negative energy balance of [phone] kcal/d;Understanding recommendations for meals to include 15-35% energy as protein, 25-35% energy from fat, 35-60% energy from carbohydrates, less than 280m of dietary cholesterol, 20-35 gm of total fiber daily;Understanding of distribution of calorie intake throughout the day with the consumption of 4-5 meals/snacks    Hypertension  Yes    Intervention  Provide education on lifestyle modifcations including regular physical activity/exercise, weight management, moderate sodium restriction and increased consumption of fresh fruit, vegetables, and low fat dairy, alcohol moderation, and smoking cessation.;Monitor prescription use compliance.    Expected Outcomes  Short Term: Continued assessment and intervention until BP is < 140/951mHG in hypertensive participants. < 130/8076mG in hypertensive participants with diabetes, heart failure or chronic kidney disease.;Long Term: Maintenance of blood pressure at goal levels.    Lipids  Yes    Intervention  Provide education and support for participant on nutrition & aerobic/resistive exercise along with prescribed medications to achieve LDL <27m61mDL >40mg11m Expected Outcomes  Short Term: Participant states understanding of desired cholesterol values and is compliant with medications prescribed. Participant is following exercise prescription and nutrition guidelines.;Long Term: Cholesterol controlled with medications as prescribed, with individualized exercise RX and with personalized nutrition plan. Value goals: LDL < 27mg,38m > 40 mg.       Core Components/Risk Factors/Patient Goals Review:   Goals and Risk Factor Review    Row Name 06/19/18 1002 07/08/18 1129 08/11/18 1143 09/22/18 1311 10/14/18 1215     Core Components/Risk Factors/Patient Goals Review   Personal Goals Review  Weight Management/Obesity;Hypertension;Lipids  Weight Management/Obesity;Lipids;Hypertension  Weight Management/Obesity;Lipids;Hypertension  Weight Management/Obesity;Lipids;Hypertension  Weight Management/Obesity;Lipids;Hypertension   Review  Quisha Shaliciaeen doing well in rehab.  She is having some bad side effects with mouth pain and peeling skin.  She talked to her doctor about it and they switched to birlinta, but she is still having the same symptoms.  I encouraged her to contact her doctor again about trying to switch to effient instead.  Her symptoms are affecting her quality of life.   Her weight is up a little as she has been stress eating but she trying to eat healthier options. Her blood pressure have been good.  She has not been checking them at home.  She does have a cuff and will start to record them to take to office with her.   Lillianne Ayiannaost 6 lbs due to increased activity and healthy eating. She is happy with this outcome and will continue with her current lifestyle changes to achieve more weight loss. She take her blood pressure and cholesterol medications as prescribed. She has a blood pressure cuff at home but does not check her bp regularly. She has bloodwork done every 3-6 months or more often if needed.   Bennie Shaminaeen doing well at home.  She said that her weight is up some due to stress eating and from being home.  She has tried to find some better snack options and went to store after talking with Melissa to get some new healthier things  to try.  Other than her weight, her number at home have been doing well.    Yarissa has been doing well.  Her weight continues to be up some due to stress eating (now on [employer]).  She is trying to be good otherwise though.  Her numbers have been pretty  good.    Tearsa is doing well.  She is motivated to lose weight and has restarted her diet!! Her pressures have continued to do well.    Expected Outcomes  Short: Start checking blood pressure more at home and talk to doctor about blood thinner side effects.  Long: Continue to work on weight loss.   Short: start checking blood pressure at home and bring cuff in to verify accuracy. She had not completed this goal from last review but wants to. Long: Continue her current lifestyle changes to reduce weight and manage risk factors.   Short: Continue to check pressures.  Long: Continue to work on [employer]  Short: Continue to work on stress eating.  Long: Continue to monitor risk factors.   Short: Lose weight! Long: Continue to monitor risk factors.       Core Components/Risk Factors/Patient Goals at Discharge (Final Review):  Goals and Risk Factor Review - 10/14/18 1215      Core Components/Risk Factors/Patient Goals Review   Personal Goals Review  Weight Management/Obesity;Lipids;Hypertension    Review  Stanley is doing well.  She is motivated to lose weight and has restarted her diet!! Her pressures have continued to do well.     Expected Outcomes  Short: Lose weight! Long: Continue to monitor risk factors.        ITP Comments: ITP Comments    Row Name 05/29/18 1500 06/04/18 1351 07/02/18 0613 07/23/18 1150 07/30/18 1219   ITP Comments  Med Review completed. Initial ITP created. Diagnosis can be found in Care Everywhere 04/25/18  30 day review completed. ITP sent to Dr. Emily Filbert, Medical Director of Cardiac Rehab. Continue with ITP unless changes are made by physician.  30 day review. Continue with ITP unless directed changes by Medical Director chart review.  Genola has been out sick for the past week.  Our program is currently closed due to COVID-19.  We are communicating with patient via phone calls and emails.      30 day review. Continue with ITP unless directed changes by Medical Director chart  review.   [place] Name 09/01/18 1110 11/19/18 1417         ITP Comments  Called to check on patient.  Left message, sent email.   30 day review cycle restarting  after being closed since March 16 because of  Covid 19 pandemic. Program opened to patients on July 6. Not all have returned. ITP updated and sent to Medical Director for review,changes as needed and signature         Comments:

## 2018-11-20 ENCOUNTER — Encounter: Payer: Medicare Other | Admitting: *Deleted

## 2018-11-20 ENCOUNTER — Other Ambulatory Visit: Payer: Self-pay

## 2018-11-20 DIAGNOSIS — Z9861 Coronary angioplasty status: Secondary | ICD-10-CM

## 2018-11-20 DIAGNOSIS — Z955 Presence of coronary angioplasty implant and graft: Secondary | ICD-10-CM | POA: Diagnosis not present

## 2018-11-20 NOTE — Progress Notes (Signed)
Daily Session Note  Patient Details  Name: Monica Bush MRN: [id number] Date of Birth: 11-07-1940 Referring Provider:     Cardiac Rehab from 05/29/2018 in [facility] Cardiac and Pulmonary Rehab  Referring Provider  kelsey      Encounter Date: 11/20/2018  Check In: Session Check In - 11/20/18 1417      Check-In   Supervising physician immediately available to respond to emergencies  See telemetry face sheet for immediately available ER MD    Location  [facility]-Cardiac & Pulmonary Rehab    Staff Present  Renita Papa, RN BSN;Melissa Caiola RDN, LDN;Jessica Luan Pulling, MA, RCEP, CCRP, CCET    Virtual Visit  No    Medication changes reported      No    Fall or balance concerns reported     Yes    Tobacco Cessation  No Change    Warm-up and Cool-down  Performed on first and last piece of equipment    Resistance Training Performed  Yes    VAD Patient?  No    PAD/SET Patient?  No      Pain Assessment   Currently in Pain?  No/denies          Social History   Tobacco Use  Smoking Status Never Smoker  Smokeless Tobacco Never Used    Goals Met:  Independence with exercise equipment Exercise tolerated well No report of cardiac concerns or symptoms Strength training completed today  Goals Unmet:  Not Applicable  Comments: Pt able to follow exercise prescription today without complaint.  Will continue to monitor for progression.    Dr. Emily Filbert is Medical Director for [place] and [facility].

## 2018-11-25 ENCOUNTER — Encounter: Payer: Self-pay | Admitting: Emergency Medicine

## 2018-11-25 ENCOUNTER — Other Ambulatory Visit: Payer: Self-pay

## 2018-11-25 ENCOUNTER — Emergency Department
Admission: EM | Admit: 2018-11-25 | Discharge: 2018-11-25 | Disposition: A | Discharge status: Home / Self Care | Payer: Medicare Other | Attending: Student in an Organized Health Care Education/Training Program | Admitting: Student in an Organized Health Care Education/Training Program

## 2018-11-25 DIAGNOSIS — I259 Chronic ischemic heart disease, unspecified: Secondary | ICD-10-CM | POA: Insufficient documentation

## 2018-11-25 DIAGNOSIS — Z79899 Other long term (current) drug therapy: Secondary | ICD-10-CM | POA: Insufficient documentation

## 2018-11-25 DIAGNOSIS — Z20828 Contact with and (suspected) exposure to other viral communicable diseases: Secondary | ICD-10-CM | POA: Diagnosis not present

## 2018-11-25 DIAGNOSIS — I10 Essential (primary) hypertension: Secondary | ICD-10-CM | POA: Diagnosis not present

## 2018-11-25 DIAGNOSIS — J45909 Unspecified asthma, uncomplicated: Secondary | ICD-10-CM | POA: Diagnosis not present

## 2018-11-25 DIAGNOSIS — Z7982 Long term (current) use of aspirin: Secondary | ICD-10-CM | POA: Insufficient documentation

## 2018-11-25 DIAGNOSIS — J029 Acute pharyngitis, unspecified: Secondary | ICD-10-CM | POA: Diagnosis present

## 2018-11-25 DIAGNOSIS — H6521 Chronic serous otitis media, right ear: Secondary | ICD-10-CM | POA: Diagnosis not present

## 2018-11-25 MED ORDER — DOXYCYCLINE HYCLATE 100 MG PO TABS: 100.0000 mg | ORAL_TABLET | Freq: Two times a day (BID) | ORAL | 0 refills | Status: AC

## 2018-11-25 MED ORDER — LIDOCAINE HCL (PF) 1 % IJ SOLN: 5.0000 mL | Freq: Once | INTRAMUSCULAR | Status: DC

## 2018-11-25 NOTE — ED Provider Notes (Signed)
[facility] Emergency Department Provider Note ____________________________________________  Time seen: 1535  I have reviewed the triage vital signs and the nursing notes.  HISTORY  Chief Complaint  Sore Throat  HPI Monica Bush is a 78 y.o. female presents to the ED at the advice of her primary care provider, for evaluation management of symptoms that include sinus pressure, otalgia, and sore throat.  Patient denies any frank fevers, chills, or sweats.  She reports a right-sided sore throat pain originating at the right ear.  She reports a nonproductive cough, and clear nasal drainage.  Patient denies any sick contacts, recent travel, or other high risk exposures.  She is however, concern for COVID-19, and is requesting testing at this time.  Past Medical History:  Diagnosis Date  . Anxiety   . Asthma   . CAD (coronary artery disease)   . Hyperlipemia   . Hypertension   . Osteopetrosis   . Reflux esophagitis     Patient Active Problem List   Diagnosis Date Noted  . Asthma exacerbation, mild 05/29/2018  . CAD S/P percutaneous coronary angioplasty 05/29/2018  . Osteoporosis 05/29/2018  . Rotator cuff tendinitis, left 12/29/2014  . Anxiety 07/01/2012  . Hyperlipidemia 01/01/2011  . Hypertension 01/01/2011    Past Surgical History:  Procedure Laterality Date  . SHOULDER SURGERY      Prior to Admission medications   Medication Sig Start Date End Date Taking? Authorizing Provider  amLODipine (NORVASC) 10 MG tablet Take 10 mg by mouth daily.    [provider]  aspirin 81 MG chewable tablet Chew 81 mg by mouth daily.    [provider]  atorvastatin (LIPITOR) 40 MG tablet Take 40 mg by mouth daily.    [provider]  BRILINTA 90 MG TABS tablet  05/01/18   [provider]  chlorpheniramine (CHLOR-TRIMETON) 4 MG tablet Take by mouth.    [provider]  Cholecalciferol (VITAMIN D3) 25 MCG (1000 UT) CAPS Take  by mouth.    [provider]  doxycycline (VIBRA-TABS) 100 MG tablet Take 1 tablet (100 mg total) by mouth 2 (two) times daily for 10 days. 11/25/18 12/05/18  Toure Edmonds, Charlesetta IvoryJenise V Bacon, PA-C  fluticasone (FLONASE) 50 MCG/ACT nasal spray Place into the nose. 11/14/15   [provider]  metroNIDAZOLE (METROGEL) 1 % gel Apply topically. 12/26/17 12/26/18  [provider]  nitroGLYCERIN (NITROSTAT) 0.4 MG SL tablet  04/29/18   [provider]    Allergies Alprazolam, Clarithromycin, Levaquin [levofloxacin in d5w], Tramadol, Xopenex [levalbuterol hcl], and Mobic [meloxicam]  Family History  Problem Relation Age of Onset  . Breast cancer Neg Hx     Social History Social History   Tobacco Use  . Smoking status: Never Smoker  . Smokeless tobacco: Never Used  Substance Use Topics  . Alcohol use: Yes  . Drug use: No    Review of Systems  Constitutional: Negative for fever. Eyes: Negative for visual changes. ENT: Positive for sore throat and sinus congestion. Cardiovascular: Negative for chest pain. Respiratory: Negative for shortness of breath. Gastrointestinal: Negative for abdominal pain, vomiting and diarrhea. Genitourinary: Negative for dysuria. Musculoskeletal: Negative for back pain. Skin: Negative for rash. Neurological: Negative for headaches, focal weakness or numbness. ____________________________________________  PHYSICAL EXAM:  VITAL SIGNS: ED Triage Vitals  Enc Vitals Group     BP 11/25/18 1420 (!) 132/92     Pulse Rate 11/25/18 1420 77     Resp 11/25/18 1420 18  Temp 11/25/18 1420 98.7 F (37.1 C)     Temp Source 11/25/18 1420 Oral     SpO2 11/25/18 1420 97 %     Weight 11/25/18 1420 161 lb (73 kg)     Height 11/25/18 1420 5' (1.524 m)     Head Circumference --      Peak Flow --      Pain Score 11/25/18 1424 3     Pain Loc --      Pain Edu? --      Excl. in Opp? --     Constitutional: Alert and oriented. Well appearing  and in no distress. Head: Normocephalic and atraumatic. Eyes: Conjunctivae are normal. PERRL. Normal extraocular movements Ears: Canals clear. TMs intact bilaterally.  Right TM is bulging with a clear serous effusion appreciated.  Patient is tender along the right eustachian tube at the angle of the mandible. Nose: No congestion/rhinorrhea/epistaxis. Mouth/Throat: Mucous membranes are moist.  Uvula is midline and tonsils are flat.  No oropharyngeal lesions or erythema are appreciated. Neck: Supple. No thyromegaly. Hematological/Lymphatic/Immunological: No cervical lymphadenopathy. Cardiovascular: Normal rate, regular rhythm. Normal distal pulses. Respiratory: Normal respiratory effort. No wheezes/rales/rhonchi. Gastrointestinal: Soft and nontender. No distention. Musculoskeletal: Nontender with normal range of motion in all extremities.  Neurologic:  Normal gait without ataxia. Normal speech and language. No gross focal neurologic deficits are appreciated. Skin:  Skin is warm, dry and intact. No rash noted. Psychiatric: Mood and affect are normal. Patient exhibits appropriate insight and judgment. ____________________________________________   LABS (pertinent positives/negatives) Labs Reviewed  NOVEL CORONAVIRUS, NAA (HOSPITAL ORDER, SEND-OUT TO REF LAB)  ____________________________________________  PROCEDURES  Procedures ____________________________________________  INITIAL IMPRESSION / ASSESSMENT AND PLAN / ED COURSE  Monica Bush was evaluated in Emergency Department on 11/25/2018 for the symptoms described in the history of present illness. She was evaluated in the context of the global COVID-19 pandemic, which necessitated consideration that the patient might be at risk for infection with the SARS-CoV-2 virus that causes COVID-19. Institutional protocols and algorithms that pertain to the evaluation of patients at risk for COVID-19 are in a state of rapid change based on information  released by regulatory bodies including the CDC and federal and state organizations. These policies and algorithms were followed during the patient's care in the ED.  Patient with ED evaluation of sinus pressure, sore throat, and mild headache. Her symptoms are consistent with a serous OM on the right and rhinitis. She will be sent home with instructions to dose her daily allergy medicine and nasal steroid. She is evaluated, tested and sent home with instructions for home care and Quarantine. She will remain under quarantine until her COVID test results are available.  ____________________________________________  FINAL CLINICAL IMPRESSION(S) / ED DIAGNOSES  Final diagnoses:  Right chronic serous otitis media      Carmie End, Dannielle Karvonen, PA-C 11/25/18 1657    Merlyn Lot, MD 11/25/18 1709

## 2018-11-25 NOTE — ED Notes (Signed)
See triage note  Presents with sinus pressure,headache and cough    States sx's started on Saturday  Afebrile on arrival

## 2018-11-25 NOTE — Discharge Instructions (Addendum)
You are being tested for COVID-19. You should remain at home until your results are available. Take your home meds as directed. You may hold and start the antibiotics, if symptoms don't improve.

## 2018-11-25 NOTE — ED Triage Notes (Signed)
PT arrives with concerns over sore throat, sinus congestion, and headache. Pt states symptoms started Saturday.

## 2018-11-26 LAB — NOVEL CORONAVIRUS, NAA (HOSP ORDER, SEND-OUT TO REF LAB; TAT 18-24 HRS): SARS-CoV-2, NAA: NOT DETECTED

## 2018-12-01 ENCOUNTER — Telehealth: Payer: Self-pay | Admitting: Emergency Medicine

## 2018-12-01 NOTE — Telephone Encounter (Signed)
Called patient to inform of negative covid test.  She is awaare.

## 2018-12-03 ENCOUNTER — Encounter: Payer: Medicare Other | Admitting: *Deleted

## 2018-12-03 ENCOUNTER — Other Ambulatory Visit: Payer: Self-pay

## 2018-12-03 DIAGNOSIS — Z9861 Coronary angioplasty status: Secondary | ICD-10-CM

## 2018-12-03 DIAGNOSIS — Z955 Presence of coronary angioplasty implant and graft: Secondary | ICD-10-CM | POA: Diagnosis not present

## 2018-12-03 NOTE — Progress Notes (Signed)
Daily Session Note  Patient Details  Name: Monica Bush MRN: [id number] Date of Birth: July 06, 1940 Referring Provider:     Cardiac Rehab from 05/29/2018 in [facility]  Referring Provider  kelsey      Encounter Date: 12/03/2018  Check In: Session Check In - 12/03/18 1413      Check-In   Supervising physician immediately available to respond to emergencies  See telemetry face sheet for immediately available ER MD    Location  [facility]-Cardiac & Pulmonary Rehab    Staff Present  Renita Papa, RN BSN;Joseph Tessie Fass RCP,RRT,BSRT    Virtual Visit  No    Medication changes reported      No    Fall or balance concerns reported     No    Warm-up and Cool-down  Performed on first and last piece of equipment    Resistance Training Performed  Yes    VAD Patient?  No    PAD/SET Patient?  No      Pain Assessment   Currently in Pain?  No/denies          Social History   Tobacco Use  Smoking Status Never Smoker  Smokeless Tobacco Never Used    Goals Met:  Independence with exercise equipment Exercise tolerated well No report of cardiac concerns or symptoms Strength training completed today  Goals Unmet:  Not Applicable  Comments: Pt able to follow exercise prescription today without complaint.  Will continue to monitor for progression.    Dr. Emily Filbert is Medical Director for [place] [facility].

## 2018-12-15 ENCOUNTER — Encounter: Payer: Medicare Other | Attending: Cardiology

## 2018-12-15 DIAGNOSIS — Z955 Presence of coronary angioplasty implant and graft: Secondary | ICD-10-CM | POA: Insufficient documentation

## 2018-12-17 ENCOUNTER — Other Ambulatory Visit: Payer: Self-pay

## 2018-12-17 ENCOUNTER — Encounter: Payer: Self-pay | Admitting: *Deleted

## 2018-12-17 DIAGNOSIS — Z9861 Coronary angioplasty status: Secondary | ICD-10-CM

## 2018-12-17 DIAGNOSIS — Z955 Presence of coronary angioplasty implant and graft: Secondary | ICD-10-CM

## 2018-12-17 NOTE — Progress Notes (Signed)
Cardiac Individual Treatment Plan  Patient Details  Name: Monica Bush MRN: [id number] Date of Birth: 05/01/1941 Referring Provider:     Cardiac Rehab from 05/29/2018 in [facility] Cardiac and Pulmonary Rehab  Referring Provider  kelsey      Initial Encounter Date:    Cardiac Rehab from 05/29/2018 in [facility] Cardiac and Pulmonary Rehab  Date  05/29/18      Visit Diagnosis: S/P PTCA (percutaneous transluminal coronary angioplasty)  Status post coronary artery stent placement  Patient's Home Medications on Admission:  Current Outpatient Medications:  .  amLODipine (NORVASC) 10 MG tablet, Take 10 mg by mouth daily., Disp: , Rfl:  .  aspirin 81 MG chewable tablet, Chew 81 mg by mouth daily., Disp: , Rfl:  .  atorvastatin (LIPITOR) 40 MG tablet, Take 40 mg by mouth daily., Disp: , Rfl:  .  BRILINTA 90 MG TABS tablet, , Disp: , Rfl:  .  chlorpheniramine (CHLOR-TRIMETON) 4 MG tablet, Take by mouth., Disp: , Rfl:  .  Cholecalciferol (VITAMIN D3) 25 MCG (1000 UT) CAPS, Take by mouth., Disp: , Rfl:  .  fluticasone (FLONASE) 50 MCG/ACT nasal spray, Place into the nose., Disp: , Rfl:  .  metroNIDAZOLE (METROGEL) 1 % gel, Apply topically., Disp: , Rfl:  .  nitroGLYCERIN (NITROSTAT) 0.4 MG SL tablet, , Disp: , Rfl:   Past Medical History: Past Medical History:  Diagnosis Date  . Anxiety   . Asthma   . CAD (coronary artery disease)   . Hyperlipemia   . Hypertension   . Osteopetrosis   . Reflux esophagitis     Tobacco Use: Social History   Tobacco Use  Smoking Status Never Smoker  Smokeless Tobacco Never Used    Labs: Recent Review Flowsheet Data    There is no flowsheet data to display.       Exercise Target Goals: Exercise Program Goal: Individual exercise prescription set using results from initial 6 min walk test and THRR while considering  patient's activity barriers and safety.   Exercise Prescription Goal: Initial exercise prescription builds to 30-45 minutes a day of  aerobic activity, 2-3 days per week.  Home exercise guidelines will be given to patient during program as part of exercise prescription that the participant will acknowledge.  Activity Barriers & Risk Stratification:   6 Minute Walk:   Oxygen Initial Assessment:   Oxygen Re-Evaluation:   Oxygen Discharge (Final Oxygen Re-Evaluation):   Initial Exercise Prescription:   Perform Capillary Blood Glucose checks as needed.  Exercise Prescription Changes: Exercise Prescription Changes    Row Name 06/25/18 1600 07/09/18 1400 07/23/18 1100 11/26/18 1500 12/08/18 1200     Response to Exercise   Blood Pressure (Admit)  126/64  110/56  136/70  132/70  116/60   Blood Pressure (Exercise)  144/64  124/70  122/62  126/64  160/58   Blood Pressure (Exit)  118/66  128/70  122/62  124/80  104/62   Heart Rate (Admit)  82 bpm  85 bpm  63 bpm  70 bpm  86 bpm   Heart Rate (Exercise)  122 bpm  112 bpm  109 bpm  117 bpm  118 bpm   Heart Rate (Exit)  90 bpm  76 bpm  74 bpm  89 bpm  85 bpm   Rating of Perceived Exertion (Exercise)  _0 Symptoms  none  none  none  none  none   Duration  Continue with 30  min of aerobic exercise without signs/symptoms of physical distress.  Continue with 30 min of aerobic exercise without signs/symptoms of physical distress.  Continue with 30 min of aerobic exercise without signs/symptoms of physical distress.  Continue with 30 min of aerobic exercise without signs/symptoms of physical distress.  Continue with 30 min of aerobic exercise without signs/symptoms of physical distress.   Intensity  THRR unchanged  THRR unchanged  THRR unchanged  THRR unchanged  THRR unchanged     Progression   Progression  Continue to progress workloads to maintain intensity without signs/symptoms of physical distress.  Continue to progress workloads to maintain intensity without signs/symptoms of physical distress.  Continue to progress workloads to maintain intensity without  signs/symptoms of physical distress.  Continue to progress workloads to maintain intensity without signs/symptoms of physical distress.  Continue to progress workloads to maintain intensity without signs/symptoms of physical distress.   Average METs  3.26  2.59  2.59  2.92  2.95     Resistance Training   Training Prescription  Yes  Yes  Yes  Yes  Yes   Weight  3 lbs  3 lbs  3 lbs  3 lbs  3 lbs   Reps  10-15  10-15  10-15  10-15  10-15     Interval Training   Interval Training  No  No  No  No  No     Treadmill   MPH  2.3  2.3  2.3  2.3  2.3   Grade  _0 Minutes  _1 METs  3.08  3.08  3.08  3.08  3.08     Recumbant Bike   Level  -  -  -  3  3   Watts  -  -  -  33  33   Minutes  -  -  -  15  15   METs  -  -  -  3.47  3.47     NuStep   Level  _2 Minutes  _3 METs  2.7  2.7  2.7  2.6  2.6     Biostep-RELP   Level  _4 -  -   Minutes  _5 -  -   METs  _6 -  -     Home Exercise Plan   Plans to continue exercise at  [employer] (comment) walking and [employer] (comment) walking and [employer]medical) (comment) walking and [employer] (comment) walking and [employer] (comment) walking and [employer]   Frequency  Add 2 additional days to program exercise sessions.  Add 2 additional days to program exercise sessions.  Add 2 additional days to program exercise sessions.  Add 2 additional days to program exercise sessions.  Add 2 additional days to program exercise sessions.   Initial Home Exercises Provided  06/19/18  06/19/18  06/19/18  06/19/18  06/19/18      Exercise Comments:   Exercise Goals and Review:   Exercise Goals Re-Evaluation : Exercise Goals Re-Evaluation    [place] Name 06/25/18 1602 07/08/18 1039 07/23/18 1151 08/11/18 1135 09/22/18 1305  Exercise Goal Re-Evaluation   Exercise Goals Review   Increase Physical Activity;Increase Strength and Stamina;Understanding of Exercise Prescription  Increase Physical Activity;Able to understand and use rate of perceived exertion (RPE) scale;Knowledge and understanding of Target Heart Rate Range (THRR);Understanding of Exercise Prescription;Increase Strength and Stamina;Able to check pulse independently  Increase Physical Activity;Increase Strength and Stamina;Understanding of Exercise Prescription  Increase Physical Activity;Increase Strength and Stamina;Understanding of Exercise Prescription  Increase Physical Activity;Increase Strength and Stamina;Understanding of Exercise Prescription   Comments  Yousra is doing well in rehab.  She is up to 2.3 mph on the treadmill.  We will continue to monitor her progress.   Cystal has noticed that she can do more at home since starting Cardiac Rehab. She is up to Level 4 on the Nustep. She is going to [employer] 3 days per week in addition to Cardiac Rehab. She is encouraging her niece to exercise as well.   Shivani has only had one visit since last review as she has been out sick.  She will be walking at home.  We will continue to monitor her progress at home.   Eileene is doing well at home.  She is exercsing everyday. She goes for a walk with a friend from church, maintaining distance, to get out of the house.  She is also using our videos each day.  She has pushed her dining room table out of the way and using weights as well.   We will continue to monitor her progress  Valma has been doing her walking still, but it is starting to hurt her hip. She has been heating and icing her hip; we also talked about some options to help stretch that area.  She is also using our videos and enjoys watching them.  Seh continues to get in her weights as well.    Expected Outcomes  Short: Increase workloads.  Long: Continue to increase strength and stamina.   Short: Add hand weights one day per week at [employer]. Long: Continue with  exercising at [employer] and graduate from the program. Progressing to finding hills easier.   Short: Continue to walk at home.  Long: Continue to increase strength and stamina.   Short: Continue to walk and use videos each day.  Long: Continue to stay active at home.   Short: Stretch out hip more.  Long: Continue to stay active using videos.    [place] Name 10/14/18 1212 11/26/18 1509 12/08/18 1215         Exercise Goal Re-Evaluation   Exercise Goals Review  Increase Physical Activity;Increase Strength and Stamina;Understanding of Exercise Prescription  Increase Physical Activity;Increase Strength and Stamina;Understanding of Exercise Prescription  Increase Physical Activity;Increase Strength and Stamina;Understanding of Exercise Prescription     Comments  Walda is doing well.  She is feeling better, the hip stretches really made a difference and she is back to walking again!!  Ardyn has been doing well in rehab.  She has missed this week with illness.  She has been able to return to rehab at her workloads from before.  We will continue to monitor her progress.  Sonakshi is doing well in rehab.  She is still recovering from her illness. She continues to rebuild her stamina.  We will continue to monitor her progress.     Expected Outcomes  Short: Continue to stretch hip.  Long: Continue to walk and use videos  Short: Return to rehab regularly.  Long: Continue to walk on off day  Short: Return to rehab regulalry again. Long: Continue to increase stamina.        Discharge Exercise Prescription (Final Exercise Prescription Changes): Exercise Prescription Changes - 12/08/18 1200      Response to Exercise   Blood Pressure (Admit)  116/60    Blood Pressure (Exercise)  160/58    Blood Pressure (Exit)  104/62    Heart Rate (Admit)  86 bpm    Heart Rate (Exercise)  118 bpm    Heart Rate (Exit)  85 bpm    Rating of Perceived Exertion (Exercise)  12    Symptoms  none    Duration  Continue with 30 min of  aerobic exercise without signs/symptoms of physical distress.    Intensity  THRR unchanged      Progression   Progression  Continue to progress workloads to maintain intensity without signs/symptoms of physical distress.    Average METs  2.95      Resistance Training   Training Prescription  Yes    Weight  3 lbs    Reps  10-15      Interval Training   Interval Training  No      Treadmill   MPH  2.3    Grade  1    Minutes  15    METs  3.08      Recumbant Bike   Level  3    Watts  33    Minutes  15    METs  3.47      NuStep   Level  4    Minutes  15    METs  2.6      Home Exercise Plan   Plans to continue exercise at  [employer] (comment)   walking and [employer]   Frequency  Add 2 additional days to program exercise sessions.    Initial Home Exercises Provided  06/19/18       Nutrition:  Target Goals: Understanding of nutrition guidelines, daily intake of sodium <1532m, cholesterol <2072m calories 30% from fat and 7% or less from saturated fats, daily to have 5 or more servings of fruits and vegetables.  Biometrics:    Nutrition Therapy Plan and Nutrition Goals: Nutrition Therapy & Goals - 11/19/18 1524      Nutrition Therapy   Diet  Low Sodium, HH diet    Protein (specify units)  60-65g    Fiber  25 grams    Whole Grain Foods  3 servings    Saturated Fats  12 max. grams    Fruits and Vegetables  5 servings/day    Sodium  1.5 grams      Personal Nutrition Goals   Nutrition Goal  ST: continue with HH eating, lose about 2 lbs LT: lose 10-15lbs    Comments  Discussed HH eating and [employer]. Pt eats low fat yogurt with peaches 2 cups of coffee (sometimes black and sometimes with half and half), salad with chicken, mixes it up for dinner chicken with olive oil baked, salsa, and mePolandheese, or chili with beans and veggies. Pt will eat whole grains, lean protein, beans, fruits and vegetables. Pt does not fear going out to eat and will try to make  healthy modifications.      Intervention Plan   Intervention  Prescribe, educate and counsel regarding individualized specific dietary modifications aiming towards targeted core components such as weight, hypertension, lipid management, diabetes, heart failure and other comorbidities.    Expected Outcomes  Short Term  Goal: Understand basic principles of dietary content, such as calories, fat, sodium, cholesterol and nutrients.;Short Term Goal: A plan has been developed with personal nutrition goals set during dietitian appointment.;Long Term Goal: Adherence to prescribed nutrition plan.       Nutrition Assessments:   Nutrition Goals Re-Evaluation: Nutrition Goals Re-Evaluation    [place] Name 07/08/18 1051             Goals   Current Weight  160 lb (72.6 kg)       Comment  Brodie is eating a heart healthy diet. She makes a spinach omelet for breakfast. She eats salad for lunch. She has steak or salmon cut into small servings for protein with her salad. She avoids sweets. She is trying to stop using creamer in her coffee. She cooks in small portions so she has no leftovers or doesnt eat too much.        Expected Outcome  Short: Continue healthy eating and making small changes. Long: Continue cooking small servings since she lives alone.          Nutrition Goals Discharge (Final Nutrition Goals Re-Evaluation): Nutrition Goals Re-Evaluation - 07/08/18 1051      Goals   Current Weight  160 lb (72.6 kg)    Comment  Rayyan is eating a heart healthy diet. She makes a spinach omelet for breakfast. She eats salad for lunch. She has steak or salmon cut into small servings for protein with her salad. She avoids sweets. She is trying to stop using creamer in her coffee. She cooks in small portions so she has no leftovers or doesnt eat too much.     Expected Outcome  Short: Continue healthy eating and making small changes. Long: Continue cooking small servings since she lives alone.        Psychosocial: Target Goals: Acknowledge presence or absence of significant depression and/or stress, maximize coping skills, provide positive support system. Participant is able to verbalize types and ability to use techniques and skills needed for reducing stress and depression.   Initial Review & Psychosocial Screening:   Quality of Life Scores:   Scores of 19 and below usually indicate a poorer quality of life in these areas.  A difference of  2-3 points is a clinically meaningful difference.  A difference of 2-3 points in the total score of the Quality of Life Index has been associated with significant improvement in overall quality of life, self-image, physical symptoms, and general health in studies assessing change in quality of life.  PHQ-9: Recent Review Flowsheet Data    Depression screen [facility] 2/9 05/29/2018   Decreased Interest 0   Down, Depressed, Hopeless 0   PHQ - 2 Score 0   Altered sleeping 0   Tired, decreased energy 1   Change in appetite 1   Feeling bad or failure about yourself  1   Trouble concentrating 0   Moving slowly or fidgety/restless 0   Suicidal thoughts 0   PHQ-9 Score 3   Difficult doing work/chores Somewhat difficult     Interpretation of Total Score  Total Score Depression Severity:  1-4 = Minimal depression, 5-9 = Mild depression, 10-14 = Moderate depression, 15-19 = Moderately severe depression, 20-27 = Severe depression   Psychosocial Evaluation and Intervention:   Psychosocial Re-Evaluation: Psychosocial Re-Evaluation    [place] Name 07/08/18 1044 08/11/18 1140 09/22/18 1307 10/14/18 1214       Psychosocial Re-Evaluation   Current issues with  Current Stress Concerns;Current Sleep Concerns  Current Stress Concerns  Current Stress Concerns  Current Stress Concerns    Comments  Keimani has a positive outlook. She reads for fun. Clutter in her home is a stress concern. She is working on de-cluttering to relieve stress and using goal settting  and checklists to get this done by pacing herself. She has noticed she sleeps better on exercise days. Encouraged to nap if she doesn't sleep well and has time for it earlier in the day. She has long distance friends and family that she talks to daily. She is especially close to her niece in [place]. They discuss heart disease and managing their risk factors.   Maelle has continued to do well at home.  She is getting out to exercise and staying positvie.  She has enjoyed the calls and videos to stay connected.    She is limiting her outings to get the essentials and lower her exposure risk.  She has found that [employer] chocolate chips helps her get her chocolate fix at less caloreies.   Zadia has been doing well at home.  She is currently out on a hunt for more toilet paper.  She said that [employer] out in 30 minutes yesterday! She is still limiting her outings for essentials.  She is also still stress eating.  After we talked last time, she got very sick for a couple of weeks and was even tested for COVID-19 but was negative.  She is now feeling better.   Niki has been doing good.  She has been working out in her yard and walking again.  She is back to walking with her neighbor and checking on them.     Expected Outcomes  Short: Continue to de-clutter and find enjoyable activities to do for self-care. Long: Use relaxation techniques to de-stress and get to sleep when she can.  Short: Continue to get exercise  Long: Continue to stay postive.   Short: Continue to recover.  Long: Continue to stay postive.   Short: Continue to connect with neighbors.  Long: Continue to stay positive.     Interventions  -  Encouraged to attend Cardiac Rehabilitation for the exercise  Encouraged to attend Cardiac Rehabilitation for the exercise  -    Continue Psychosocial Services   -  Follow up required by staff  Follow up required by staff  -       Psychosocial Discharge (Final Psychosocial Re-Evaluation): Psychosocial  Re-Evaluation - 10/14/18 1214      Psychosocial Re-Evaluation   Current issues with  Current Stress Concerns    Comments  Aurelie has been doing good.  She has been working out in her yard and walking again.  She is back to walking with her neighbor and checking on them.     Expected Outcomes  Short: Continue to connect with neighbors.  Long: Continue to stay positive.        Vocational Rehabilitation: Provide vocational rehab assistance to qualifying candidates.   Vocational Rehab Evaluation & Intervention:   Education: Education Goals: Education classes will be provided on a variety of topics geared toward better understanding of heart health and risk factor modification. Participant will state understanding/return demonstration of topics presented as noted by education test scores.  Learning Barriers/Preferences:   Education Topics:  AED/CPR: - Group verbal and written instruction with the use of models to demonstrate the basic use of the AED with the basic ABC's of resuscitation.   General Nutrition Guidelines/Fats and Fiber: -Group instruction provided  by verbal, written material, models and posters to present the general guidelines for heart healthy nutrition. Gives an explanation and review of dietary fats and fiber.   Controlling Sodium/Reading Food Labels: -Group verbal and written material supporting the discussion of sodium use in heart healthy nutrition. Review and explanation with models, verbal and written materials for utilization of the food label.   Exercise Physiology & General Exercise Guidelines: - Group verbal and written instruction with models to review the exercise physiology of the cardiovascular system and associated critical values. Provides general exercise guidelines with specific guidelines to those with heart or lung disease.    Aerobic Exercise & Resistance Training: - Gives group verbal and written instruction on the various components of  exercise. Focuses on aerobic and resistive training programs and the benefits of this training and how to safely progress through these programs..   Flexibility, Balance, Mind/Body Relaxation: Provides group verbal/written instruction on the benefits of flexibility and balance training, including mind/body exercise modes such as yoga, pilates and tai chi.  Demonstration and skill practice provided.   Stress and Anxiety: - Provides group verbal and written instruction about the health risks of elevated stress and causes of high stress.  Discuss the correlation between heart/lung disease and anxiety and treatment options. Review healthy ways to manage with stress and anxiety.   Cardiac Rehab from 07/10/2018 in [facility] Cardiac and Pulmonary Rehab  Date  06/24/18  Educator  [facility]  Instruction Review Code  1- Verbalizes Understanding      Depression: - Provides group verbal and written instruction on the correlation between heart/lung disease and depressed mood, treatment options, and the stigmas associated with seeking treatment.   Cardiac Rehab from 07/10/2018 in [facility] Cardiac and Pulmonary Rehab  Date  07/08/18  Educator  [facility]  Instruction Review Code  1- Verbalizes Understanding      Anatomy & Physiology of the Heart: - Group verbal and written instruction and models provide basic cardiac anatomy and physiology, with the coronary electrical and arterial systems. Review of Valvular disease and Heart Failure   Cardiac Rehab from 07/10/2018 in [facility] Cardiac and Pulmonary Rehab  Date  06/12/18  Educator  CE  Instruction Review Code  1- Verbalizes Understanding      Cardiac Procedures: - Group verbal and written instruction to review commonly prescribed medications for heart disease. Reviews the medication, class of the drug, and side effects. Includes the steps to properly store meds and maintain the prescription regimen. (beta blockers and nitrates)   Cardiac Rehab from 07/10/2018 in [facility] Cardiac and  Pulmonary Rehab  Date  06/26/18  Educator  CE  Instruction Review Code  1- Verbalizes Understanding      Cardiac Medications I: - Group verbal and written instruction to review commonly prescribed medications for heart disease. Reviews the medication, class of the drug, and side effects. Includes the steps to properly store meds and maintain the prescription regimen.   Cardiac Medications II: -Group verbal and written instruction to review commonly prescribed medications for heart disease. Reviews the medication, class of the drug, and side effects. (all other drug classes)   Cardiac Rehab from 07/10/2018 in [facility] Cardiac and Pulmonary Rehab  Date  07/10/18  Educator  CE  Instruction Review Code  1- Verbalizes Understanding       Go Sex-Intimacy & Heart Disease, Get SMART - Goal Setting: - Group verbal and written instruction through game format to discuss heart disease and the return to sexual intimacy. Provides group verbal  and written material to discuss and apply goal setting through the application of the S.M.A.R.T. Method.   Cardiac Rehab from 07/10/2018 in [facility] Cardiac and Pulmonary Rehab  Date  06/26/18  Educator  CE  Instruction Review Code  1- Verbalizes Understanding      Other Matters of the Heart: - Provides group verbal, written materials and models to describe Stable Angina and Peripheral Artery. Includes description of the disease process and treatment options available to the cardiac patient.   Exercise & Equipment Safety: - Individual verbal instruction and demonstration of equipment use and safety with use of the equipment.   Cardiac Rehab from 07/10/2018 in [facility] Cardiac and Pulmonary Rehab  Date  05/29/18  Educator  [facility]  Instruction Review Code  1- Verbalizes Understanding      Infection Prevention: - Provides verbal and written material to individual with discussion of infection control including proper hand washing and proper equipment cleaning during  exercise session.   Cardiac Rehab from 07/10/2018 in [facility] Cardiac and Pulmonary Rehab  Date  05/29/18  Educator  [facility]  Instruction Review Code  1- Verbalizes Understanding      Falls Prevention: - Provides verbal and written material to individual with discussion of falls prevention and safety.   Cardiac Rehab from 07/10/2018 in [facility] Cardiac and Pulmonary Rehab  Date  05/29/18  Educator  [facility]  Instruction Review Code  1- Verbalizes Understanding      Diabetes: - Individual verbal and written instruction to review signs/symptoms of diabetes, desired ranges of glucose level fasting, after meals and with exercise. Acknowledge that pre and post exercise glucose checks will be done for 3 sessions at entry of program.   Know Your Numbers and Risk Factors: -Group verbal and written instruction about important numbers in your health.  Discussion of what are risk factors and how they play a role in the disease process.  Review of Cholesterol, Blood Pressure, Diabetes, and BMI and the role they play in your overall health.   Cardiac Rehab from 07/10/2018 in [facility] Cardiac and Pulmonary Rehab  Date  07/10/18  Educator  CE  Instruction Review Code  1- Verbalizes Understanding      Sleep Hygiene: -Provides group verbal and written instruction about how sleep can affect your health.  Define sleep hygiene, discuss sleep cycles and impact of sleep habits. Review good sleep hygiene tips.    Cardiac Rehab from 07/10/2018 in [facility] Cardiac and Pulmonary Rehab  Date  06/10/18  Educator  [facility]  Instruction Review Code  1- Verbalizes Understanding      Other: -Provides group and verbal instruction on various topics (see comments)   Knowledge Questionnaire Score:   Core Components/Risk Factors/Patient Goals at Admission:   Core Components/Risk Factors/Patient Goals Review:  Goals and Risk Factor Review    Row Name 07/08/18 1129 08/11/18 1143 09/22/18 1311 10/14/18 1215       Core Components/Risk  Factors/Patient Goals Review   Personal Goals Review  Weight Management/Obesity;Lipids;Hypertension  Weight Management/Obesity;Lipids;Hypertension  Weight Management/Obesity;Lipids;Hypertension  Weight Management/Obesity;Lipids;Hypertension    Review  Alveria has lost 6 lbs due to increased activity and healthy eating. She is happy with this outcome and will continue with her current lifestyle changes to achieve more weight loss. She take her blood pressure and cholesterol medications as prescribed. She has a blood pressure cuff at home but does not check her bp regularly. She has bloodwork done every 3-6 months or more often if needed.   [place] has been doing  well at home.  She said that her weight is up some due to stress eating and from being home.  She has tried to find some better snack options and went to store after talking with Melissa to get some new healthier things to try.  Other than her weight, her number at home have been doing well.    Nari has been doing well.  Her weight continues to be up some due to stress eating (now on [employer]).  She is trying to be good otherwise though.  Her numbers have been pretty good.    Saide is doing well.  She is motivated to lose weight and has restarted her diet!! Her pressures have continued to do well.     Expected Outcomes  Short: start checking blood pressure at home and bring cuff in to verify accuracy. She had not completed this goal from last review but wants to. Long: Continue her current lifestyle changes to reduce weight and manage risk factors.   Short: Continue to check pressures.  Long: Continue to work on [employer]  Short: Continue to work on stress eating.  Long: Continue to monitor risk factors.   Short: Lose weight! Long: Continue to monitor risk factors.        Core Components/Risk Factors/Patient Goals at Discharge (Final Review):  Goals and Risk Factor Review - 10/14/18 1215      Core Components/Risk Factors/Patient Goals Review    Personal Goals Review  Weight Management/Obesity;Lipids;Hypertension    Review  Letzy is doing well.  She is motivated to lose weight and has restarted her diet!! Her pressures have continued to do well.     Expected Outcomes  Short: Lose weight! Long: Continue to monitor risk factors.        ITP Comments: ITP Comments    Row Name 07/02/18 [phone] 07/23/18 1150 07/30/18 1219 09/01/18 1110 11/19/18 1417   ITP Comments  30 day review. Continue with ITP unless directed changes by Medical Director chart review.  Kameo has been out sick for the past week.  Our program is currently closed due to COVID-19.  We are communicating with patient via phone calls and emails.      30 day review. Continue with ITP unless directed changes by Medical Director chart review.  Called to check on patient.  Left message, sent email.   30 day review cycle restarting  after being closed since March 16 because of  Covid 19 pandemic. Program opened to patients on July 6. Not all have returned. ITP updated and sent to Medical Director for review,changes as needed and signature   [place] Name 12/17/18 0633           ITP Comments  30 Day Review Completed today. Continue with ITP unless changed by Medical Director review.          Comments:

## 2018-12-17 NOTE — Progress Notes (Signed)
Daily Session Note  Patient Details  Name: Monica Bush MRN: [id number] Date of Birth: 10-Oct-1940 Referring Provider:     Cardiac Rehab from 05/29/2018 in [facility] Cardiac and Pulmonary Rehab  Referring Provider  kelsey      Encounter Date: 12/17/2018  Check In:      Social History   Tobacco Use  Smoking Status Never Smoker  Smokeless Tobacco Never Used    Goals Met:  Independence with exercise equipment Exercise tolerated well No report of cardiac concerns or symptoms Strength training completed today  Goals Unmet:  Not Applicable  Comments: Pt able to follow exercise prescription today without complaint.  Will continue to monitor for progression.    Dr. Emily Filbert is Medical Director for [place] and [facility].

## 2018-12-18 ENCOUNTER — Encounter: Payer: Medicare Other | Admitting: *Deleted

## 2018-12-18 DIAGNOSIS — Z9861 Coronary angioplasty status: Secondary | ICD-10-CM

## 2018-12-18 DIAGNOSIS — Z955 Presence of coronary angioplasty implant and graft: Secondary | ICD-10-CM

## 2018-12-18 NOTE — Progress Notes (Signed)
Daily Session Note  Patient Details  Name: Monica Bush MRN: [id number] Date of Birth: 04-14-1941 Referring Provider:     Cardiac Rehab from 05/29/2018 in [facility] Cardiac and Pulmonary Rehab  Referring Provider  kelsey      Encounter Date: 12/18/2018  Check In: Session Check In - 12/18/18 1403      Check-In   Supervising physician immediately available to respond to emergencies  See telemetry face sheet for immediately available ER MD    Location  [facility]-Cardiac & Pulmonary Rehab    Staff Present  Renita Papa, RN BSN;Jessica Luan Pulling, MA, RCEP, CCRP, CCET;Joseph [place] RCP,RRT,BSRT    Virtual Visit  No    Medication changes reported      No    Fall or balance concerns reported     No    Warm-up and Cool-down  Performed on first and last piece of equipment    Resistance Training Performed  Yes    VAD Patient?  No    PAD/SET Patient?  No      Pain Assessment   Currently in Pain?  No/denies          Social History   Tobacco Use  Smoking Status Never Smoker  Smokeless Tobacco Never Used    Goals Met:  Independence with exercise equipment Exercise tolerated well No report of cardiac concerns or symptoms Strength training completed today  Goals Unmet:  Not Applicable  Comments: Pt able to follow exercise prescription today without complaint.  Will continue to monitor for progression.    Dr. Emily Filbert is Medical Director for [place] and [facility].

## 2018-12-22 ENCOUNTER — Encounter: Payer: Medicare Other | Admitting: *Deleted

## 2018-12-22 ENCOUNTER — Other Ambulatory Visit: Payer: Self-pay

## 2018-12-22 DIAGNOSIS — Z9861 Coronary angioplasty status: Secondary | ICD-10-CM

## 2018-12-22 DIAGNOSIS — Z955 Presence of coronary angioplasty implant and graft: Secondary | ICD-10-CM | POA: Diagnosis not present

## 2018-12-22 NOTE — Progress Notes (Signed)
Daily Session Note  Patient Details  Name: Monica Bush MRN: [id number] Date of Birth: 03-22-1941 Referring Provider:     Cardiac Rehab from 05/29/2018 in [facility] Cardiac and Pulmonary Rehab  Referring Provider  kelsey      Encounter Date: 12/22/2018  Check In: Session Check In - 12/22/18 1418      Check-In   Supervising physician immediately available to respond to emergencies  See telemetry face sheet for immediately available ER MD    Location  [facility]-Cardiac & Pulmonary Rehab    Staff Present  Heath Lark, RN, BSN, Laveda Norman, BS, ACSM CEP, Exercise Physiologist;Jeanna Durrell BS, Exercise Physiologist    Virtual Visit  No    Medication changes reported      No    Fall or balance concerns reported     No    Warm-up and Cool-down  Performed on first and last piece of equipment    Resistance Training Performed  Yes    VAD Patient?  No    PAD/SET Patient?  No      Pain Assessment   Currently in Pain?  No/denies          Social History   Tobacco Use  Smoking Status Never Smoker  Smokeless Tobacco Never Used    Goals Met:  Independence with exercise equipment Exercise tolerated well Personal goals reviewed No report of cardiac concerns or symptoms Strength training completed today  Goals Unmet:  Not Applicable  Comments: Pt able to follow exercise prescription today without complaint.  Will continue to monitor for progression.    Dr. Emily Filbert is Medical Director for [place] and [facility].

## 2018-12-24 ENCOUNTER — Encounter: Payer: Medicare Other | Admitting: *Deleted

## 2018-12-24 ENCOUNTER — Other Ambulatory Visit: Payer: Self-pay

## 2018-12-24 DIAGNOSIS — Z9861 Coronary angioplasty status: Secondary | ICD-10-CM

## 2018-12-24 DIAGNOSIS — Z955 Presence of coronary angioplasty implant and graft: Secondary | ICD-10-CM

## 2018-12-24 LAB — GLUCOSE, CAPILLARY: Glucose-Capillary: 106 mg/dL — ABNORMAL HIGH (ref 70–99)

## 2018-12-24 NOTE — Progress Notes (Signed)
Daily Session Note  Patient Details  Name: Monica Bush MRN: [id number] Date of Birth: 1940/05/08 Referring Provider:     Cardiac Rehab from 05/29/2018 in [facility] Cardiac and Pulmonary Rehab  Referring Provider  kelsey      Encounter Date: 12/24/2018  Check In: Session Check In - 12/24/18 1420      Check-In   Supervising physician immediately available to respond to emergencies  See telemetry face sheet for immediately available ER MD    Location  [facility]-Cardiac & Pulmonary Rehab    Staff Present  Heath Lark, RN, BSN, CCRP;Amanda Sommer, BA, ACSM CEP, Exercise Physiologist;Jeanna Durrell BS, Exercise Physiologist    Virtual Visit  No    Medication changes reported      No    Fall or balance concerns reported     No    Warm-up and Cool-down  Performed on first and last piece of equipment    Resistance Training Performed  Yes    VAD Patient?  No    PAD/SET Patient?  No      Pain Assessment   Currently in Pain?  No/denies          Social History   Tobacco Use  Smoking Status Never Smoker  Smokeless Tobacco Never Used    Goals Met:  Independence with exercise equipment  Goals Unmet:  PD  Comments: [name] had some SOB and diaphoresis while on the Rec Bike,her second piece of equipment today..   She slowed down her workload and symptoms improved.  She then stated"I just don't feel right."  BP, HR, Blood sugar all checked and in normal range.  Then she told us that she had 5-6 bouts of diarrhea yesterday.  Exercise stopped for the day. Monica Bush called someone to come and take her home with them, so she will not be alone.  She was advised to call her MD or go to ED if her feeling of not felling right continues.    Dr. Emily Filbert is Medical Director for [place] and [facility].

## 2018-12-31 ENCOUNTER — Encounter: Payer: Medicare Other | Admitting: *Deleted

## 2018-12-31 ENCOUNTER — Other Ambulatory Visit: Payer: Self-pay

## 2018-12-31 DIAGNOSIS — Z955 Presence of coronary angioplasty implant and graft: Secondary | ICD-10-CM

## 2018-12-31 DIAGNOSIS — Z9861 Coronary angioplasty status: Secondary | ICD-10-CM

## 2018-12-31 NOTE — Progress Notes (Signed)
Daily Session Note  Patient Details  Name: Monica Bush MRN: [id number] Date of Birth: [birth year] Referring Provider:     Cardiac Rehab from 05/29/2018 in [facility] Cardiac and Pulmonary Rehab  Referring Provider  kelsey      Encounter Date: 12/31/2018  Check In: Session Check In - 12/31/18 1404      Check-In   Supervising physician immediately available to respond to emergencies  See telemetry face sheet for immediately available ER MD    Location  [facility]-Cardiac & Pulmonary Rehab    Staff Present  Renita Papa, RN Vickki Hearing, BA, ACSM CEP, Exercise Physiologist;Jeanna Durrell BS, Exercise Physiologist    Virtual Visit  No    Medication changes reported      No    Fall or balance concerns reported     No    Warm-up and Cool-down  Performed on first and last piece of equipment    Resistance Training Performed  Yes    VAD Patient?  No    PAD/SET Patient?  No      Pain Assessment   Currently in Pain?  No/denies          Social History   Tobacco Use  Smoking Status Never Smoker  Smokeless Tobacco Never Used    Goals Met:  Independence with exercise equipment Exercise tolerated well No report of cardiac concerns or symptoms Strength training completed today  Goals Unmet:  Not Applicable  Comments: Pt able to follow exercise prescription today without complaint.  Will continue to monitor for progression.    Dr. Emily Filbert is Medical Director for [place] and [facility].

## 2019-01-01 ENCOUNTER — Encounter: Payer: Medicare Other | Admitting: *Deleted

## 2019-01-01 DIAGNOSIS — Z955 Presence of coronary angioplasty implant and graft: Secondary | ICD-10-CM | POA: Diagnosis not present

## 2019-01-01 DIAGNOSIS — Z9861 Coronary angioplasty status: Secondary | ICD-10-CM

## 2019-01-01 NOTE — Progress Notes (Signed)
Daily Session Note  Patient Details  Name: Ajaya Crutchfield MRN: [id number] Date of Birth: 12/05/1940 Referring Provider:     Cardiac Rehab from 05/29/2018 in [facility] Cardiac and Pulmonary Rehab  Referring Provider  kelsey      Encounter Date: 01/01/2019  Check In: Session Check In - 01/01/19 1403      Check-In   Supervising physician immediately available to respond to emergencies  See telemetry face sheet for immediately available ER MD    Location  [facility]-Cardiac & Pulmonary Rehab    Staff Present  Renita Papa, RN BSN;Jessica Luan Pulling, MA, RCEP, CCRP, CCET;Jeanna Durrell BS, Exercise Physiologist    Virtual Visit  No    Medication changes reported      No    Fall or balance concerns reported     No    Warm-up and Cool-down  Performed on first and last piece of equipment    Resistance Training Performed  Yes    VAD Patient?  No    PAD/SET Patient?  No      Pain Assessment   Currently in Pain?  No/denies          Social History   Tobacco Use  Smoking Status Never Smoker  Smokeless Tobacco Never Used    Goals Met:  Independence with exercise equipment Exercise tolerated well No report of cardiac concerns or symptoms Strength training completed today  Goals Unmet:  Not Applicable  Comments: Pt able to follow exercise prescription today without complaint.  Will continue to monitor for progression.    Dr. Emily Filbert is Medical Director for [place] and [facility].

## 2019-01-07 ENCOUNTER — Other Ambulatory Visit: Payer: Self-pay

## 2019-01-07 ENCOUNTER — Encounter: Payer: Medicare Other | Attending: Cardiology | Admitting: *Deleted

## 2019-01-07 DIAGNOSIS — Z9861 Coronary angioplasty status: Secondary | ICD-10-CM

## 2019-01-07 DIAGNOSIS — Z955 Presence of coronary angioplasty implant and graft: Secondary | ICD-10-CM | POA: Diagnosis present

## 2019-01-07 NOTE — Progress Notes (Signed)
Daily Session Note  Patient Details  Name: Monica Bush MRN: [id number] Date of Birth: [birth year] Referring Provider:     Cardiac Rehab from 05/29/2018 in [facility] Cardiac and Pulmonary Rehab  Referring Provider  kelsey      Encounter Date: 01/07/2019  Check In: Session Check In - 01/07/19 1418      Check-In   Supervising physician immediately available to respond to emergencies  See telemetry face sheet for immediately available ER MD    Location  [facility]-Cardiac & Pulmonary Rehab    Staff Present  Renita Papa, RN Vickki Hearing, BA, ACSM CEP, Exercise Physiologist;Joseph Tessie Fass RCP,RRT,BSRT    Virtual Visit  No    Medication changes reported      No    Fall or balance concerns reported     No    Warm-up and Cool-down  Performed on first and last piece of equipment    Resistance Training Performed  Yes    VAD Patient?  No    PAD/SET Patient?  No      Pain Assessment   Currently in Pain?  No/denies          Social History   Tobacco Use  Smoking Status Never Smoker  Smokeless Tobacco Never Used    Goals Met:  Independence with exercise equipment Exercise tolerated well No report of cardiac concerns or symptoms Strength training completed today  Goals Unmet:  Not Applicable  Comments: Pt able to follow exercise prescription today without complaint.  Will continue to monitor for progression.    Dr. Emily Filbert is Medical Director for [place] and [facility].

## 2019-01-08 ENCOUNTER — Encounter: Payer: Medicare Other | Admitting: *Deleted

## 2019-01-08 DIAGNOSIS — Z955 Presence of coronary angioplasty implant and graft: Secondary | ICD-10-CM | POA: Diagnosis not present

## 2019-01-08 DIAGNOSIS — Z9861 Coronary angioplasty status: Secondary | ICD-10-CM

## 2019-01-08 NOTE — Progress Notes (Signed)
Daily Session Note  Patient Details  Name: Monica Bush MRN: [id number] Date of Birth: 1941-04-21 Referring Provider:     Cardiac Rehab from 05/29/2018 in [facility] Cardiac and Pulmonary Rehab  Referring Provider  kelsey      Encounter Date: 01/08/2019  Check In: Session Check In - 01/08/19 1408      Check-In   Supervising physician immediately available to respond to emergencies  See telemetry face sheet for immediately available ER MD    Location  [facility]-Cardiac & Pulmonary Rehab    Staff Present  Renita Papa, RN BSN;Jessica Luan Pulling, MA, RCEP, CCRP, CCET;Joseph Griffith RCP,RRT,BSRT    Virtual Visit  No    Medication changes reported      No    Fall or balance concerns reported     No    Warm-up and Cool-down  Performed on first and last piece of equipment    Resistance Training Performed  Yes    VAD Patient?  No      Pain Assessment   Currently in Pain?  No/denies          Social History   Tobacco Use  Smoking Status Never Smoker  Smokeless Tobacco Never Used    Goals Met:  Independence with exercise equipment Exercise tolerated well No report of cardiac concerns or symptoms Strength training completed today  Goals Unmet:  Not Applicable  Comments: Pt able to follow exercise prescription today without complaint.  Will continue to monitor for progression.    Dr. Emily Filbert is Medical Director for [place] and [facility].

## 2019-01-14 ENCOUNTER — Encounter: Payer: Medicare Other | Admitting: *Deleted

## 2019-01-14 ENCOUNTER — Encounter: Payer: Self-pay | Admitting: *Deleted

## 2019-01-14 ENCOUNTER — Other Ambulatory Visit: Payer: Self-pay

## 2019-01-14 DIAGNOSIS — Z9861 Coronary angioplasty status: Secondary | ICD-10-CM

## 2019-01-14 DIAGNOSIS — Z955 Presence of coronary angioplasty implant and graft: Secondary | ICD-10-CM

## 2019-01-14 NOTE — Progress Notes (Signed)
Cardiac Individual Treatment Plan  Patient Details  Name: Monica Bush MRN: [id number] Date of Birth: June 28, 1940 Referring Provider:     Cardiac Rehab from 05/29/2018 in [facility] Cardiac and Pulmonary Rehab  Referring Provider  kelsey      Initial Encounter Date:    Cardiac Rehab from 05/29/2018 in [facility] Cardiac and Pulmonary Rehab  Date  05/29/18      Visit Diagnosis: S/P PTCA (percutaneous transluminal coronary angioplasty)  Status post coronary artery stent placement  Patient's Home Medications on Admission:  Current Outpatient Medications:  .  amLODipine (NORVASC) 10 MG tablet, Take 10 mg by mouth daily., Disp: , Rfl:  .  aspirin 81 MG chewable tablet, Chew 81 mg by mouth daily., Disp: , Rfl:  .  atorvastatin (LIPITOR) 40 MG tablet, Take 40 mg by mouth daily., Disp: , Rfl:  .  BRILINTA 90 MG TABS tablet, , Disp: , Rfl:  .  chlorpheniramine (CHLOR-TRIMETON) 4 MG tablet, Take by mouth., Disp: , Rfl:  .  Cholecalciferol (VITAMIN D3) 25 MCG (1000 UT) CAPS, Take by mouth., Disp: , Rfl:  .  fluticasone (FLONASE) 50 MCG/ACT nasal spray, Place into the nose., Disp: , Rfl:  .  nitroGLYCERIN (NITROSTAT) 0.4 MG SL tablet, , Disp: , Rfl:   Past Medical History: Past Medical History:  Diagnosis Date  . Anxiety   . Asthma   . CAD (coronary artery disease)   . Hyperlipemia   . Hypertension   . Osteopetrosis   . Reflux esophagitis     Tobacco Use: Social History   Tobacco Use  Smoking Status Never Smoker  Smokeless Tobacco Never Used    Labs: Recent Review Flowsheet Data    There is no flowsheet data to display.       Exercise Target Goals: Exercise Program Goal: Individual exercise prescription set using results from initial 6 min walk test and THRR while considering  patient's activity barriers and safety.   Exercise Prescription Goal: Initial exercise prescription builds to 30-45 minutes a day of aerobic activity, 2-3 days per week.  Home exercise guidelines will be  given to patient during program as part of exercise prescription that the participant will acknowledge.  Activity Barriers & Risk Stratification:   6 Minute Walk:   Oxygen Initial Assessment:   Oxygen Re-Evaluation:   Oxygen Discharge (Final Oxygen Re-Evaluation):   Initial Exercise Prescription:   Perform Capillary Blood Glucose checks as needed.  Exercise Prescription Changes: Exercise Prescription Changes    Row Name 07/23/18 1100 11/26/18 1500 12/08/18 1200 12/25/18 1500 01/06/19 1200     Response to Exercise   Blood Pressure (Admit)  136/70  132/70  116/60  132/64  126/70   Blood Pressure (Exercise)  122/62  126/64  160/58  148/76  142/70   Blood Pressure (Exit)  122/62  124/80  104/62  118/64  124/70   Heart Rate (Admit)  63 bpm  70 bpm  86 bpm  95 bpm  79 bpm   Heart Rate (Exercise)  109 bpm  117 bpm  118 bpm  123 bpm  112 bpm   Heart Rate (Exit)  74 bpm  89 bpm  85 bpm  94 bpm  83 bpm   Rating of Perceived Exertion (Exercise)  _0 Symptoms  none  none  none  dizzy after exercise Monday  none   Duration  Continue with 30 min of aerobic exercise without signs/symptoms of physical distress.  Continue with 30 min of aerobic exercise without signs/symptoms of physical distress.  Continue with 30 min of aerobic exercise without signs/symptoms of physical distress.  Continue with 30 min of aerobic exercise without signs/symptoms of physical distress.  Continue with 30 min of aerobic exercise without signs/symptoms of physical distress.   Intensity  THRR unchanged  THRR unchanged  THRR unchanged  THRR unchanged  THRR unchanged     Progression   Progression  Continue to progress workloads to maintain intensity without signs/symptoms of physical distress.  Continue to progress workloads to maintain intensity without signs/symptoms of physical distress.  Continue to progress workloads to maintain intensity without signs/symptoms of physical distress.  Continue to  progress workloads to maintain intensity without signs/symptoms of physical distress.  Continue to progress workloads to maintain intensity without signs/symptoms of physical distress.   Average METs  2.59  2.92  2.95  2.71  2.91     Resistance Training   Training Prescription  Yes  Yes  Yes  Yes  Yes   Weight  3 lbs  3 lbs  3 lbs  3 lbs  3 lbs   Reps  10-15  10-15  10-15  10-15  10-15     Interval Training   Interval Training  No  No  No  No  No     Treadmill   MPH  2.3  2.3  2.3  2.4  2.4   Grade  _0 0  0   Minutes  _1 METs  3.08  3.08  3.08  2.84  2.84     Recumbant Bike   Level  -  _2 2.4   Watts  -  33  33  -  -   Minutes  -  _3 METs  -  3.47  3.47  -  3.2     NuStep   Level  _4 Minutes  _5 METs  2.7  2.6  2.6  2.8  2.7     Biostep-RELP   Level  3  -  -  -  -   Minutes  15  -  -  -  -   METs  2  -  -  -  -     Home Exercise Plan   Plans to continue exercise at  [employer] (comment) walking and [employer] (comment) walking and [employer] (comment) walking and [employer] (comment) walking and [employer] (comment) walking and [employer]   Frequency  Add 2 additional days to program exercise sessions.  Add 2 additional days to program exercise sessions.  Add 2 additional days to program exercise sessions.  Add 2 additional days to program exercise sessions.  Add 2 additional days to program exercise sessions.   Initial Home Exercises Provided  06/19/18  06/19/18  06/19/18  06/19/18  06/19/18      Exercise Comments: Exercise Comments    Row Name 12/24/18 1543           Exercise Comments  [name] had some SOB and diaphoresis while on the Rec Bike,her second piece of equipment today..   She slowed down  her workload and symptoms improved.  She then stated"I just don't feel right."  BP, HR,  Blood sugar all checked and in normal range.  Then she told us that she had 5-6 bouts of diarrhea yesterday.  Exercise stopped for the day. Monica Bush called someone to come and take her home with them, so she will not be alone.  She was advised to call her MD or go to ED if her feeling of not felling right continues.          Exercise Goals and Review:   Exercise Goals Re-Evaluation : Exercise Goals Re-Evaluation    Row Name 07/23/18 1151 08/11/18 1135 09/22/18 1305 10/14/18 1212 11/26/18 1509     Exercise Goal Re-Evaluation   Exercise Goals Review  Increase Physical Activity;Increase Strength and Stamina;Understanding of Exercise Prescription  Increase Physical Activity;Increase Strength and Stamina;Understanding of Exercise Prescription  Increase Physical Activity;Increase Strength and Stamina;Understanding of Exercise Prescription  Increase Physical Activity;Increase Strength and Stamina;Understanding of Exercise Prescription  Increase Physical Activity;Increase Strength and Stamina;Understanding of Exercise Prescription   Comments  Monica Bush has only had one visit since last review as she has been out sick.  She will be walking at home.  We will continue to monitor her progress at home.   Monica Bush is doing well at home.  She is exercsing everyday. She goes for a walk with a friend from church, maintaining distance, to get out of the house.  She is also using our videos each day.  She has pushed her dining room table out of the way and using weights as well.   We will continue to monitor her progress  Monica Bush has been doing her walking still, but it is starting to hurt her hip. She has been heating and icing her hip; we also talked about some options to help stretch that area.  She is also using our videos and enjoys watching them.  Seh continues to get in her weights as well.   Monica Bush is doing well.  She is feeling better, the hip stretches really made a difference and she is back to walking again!!  Monica Bush has  been doing well in rehab.  She has missed this week with illness.  She has been able to return to rehab at her workloads from before.  We will continue to monitor her progress.   Expected Outcomes  Short: Continue to walk at home.  Long: Continue to increase strength and stamina.   Short: Continue to walk and use videos each day.  Long: Continue to stay active at home.   Short: Stretch out hip more.  Long: Continue to stay active using videos.   Short: Continue to stretch hip.  Long: Continue to walk and use videos  Short: Return to rehab regularly.  Long: Continue to walk on off day   Row Name 12/08/18 1215 12/22/18 1419 12/25/18 1536 01/06/19 1228       Exercise Goal Re-Evaluation   Exercise Goals Review  Increase Physical Activity;Increase Strength and Stamina;Understanding of Exercise Prescription  Increase Physical Activity;Increase Strength and Stamina;Understanding of Exercise Prescription  Increase Physical Activity;Increase Strength and Stamina;Understanding of Exercise Prescription  Increase Physical Activity;Increase Strength and Stamina;Understanding of Exercise Prescription    Comments  Monica Bush is doing well in rehab.  She is still recovering from her illness. She continues to rebuild her stamina.  We will continue to monitor her progress.  Monica Bush is doing well in rehab.  She is exercising two extra days at home.  She usually walks and does stretches to stay active.  Her stamina continues to improve.  However, her grandkids continue to make her tired.  She would like to join the [employer] when she graduates.  Monica Bush continues to do well in rehab.  She is nearing graduation and will be doing her post 6MWT soon.  We expect to see some improvement.  We will continue to monitor her progress.    Expected Outcomes  Short: Return to rehab regulalry again. Long: Continue to increase stamina.  Short: Continue to exercise on days off.  Long: Continue to improve stamina.  -  Short: Improve post 6MWT.  Long:  Continue to exercise independently       Discharge Exercise Prescription (Final Exercise Prescription Changes): Exercise Prescription Changes - 01/06/19 1200      Response to Exercise   Blood Pressure (Admit)  126/70    Blood Pressure (Exercise)  142/70    Blood Pressure (Exit)  124/70    Heart Rate (Admit)  79 bpm    Heart Rate (Exercise)  112 bpm    Heart Rate (Exit)  83 bpm    Rating of Perceived Exertion (Exercise)  13    Symptoms  none    Duration  Continue with 30 min of aerobic exercise without signs/symptoms of physical distress.    Intensity  THRR unchanged      Progression   Progression  Continue to progress workloads to maintain intensity without signs/symptoms of physical distress.    Average METs  2.91      Resistance Training   Training Prescription  Yes    Weight  3 lbs    Reps  10-15      Interval Training   Interval Training  No      Treadmill   MPH  2.4    Grade  0    Minutes  15    METs  2.84      Recumbant Bike   Level  2.4    Minutes  15    METs  3.2      NuStep   Level  4    Minutes  15    METs  2.7      Home Exercise Plan   Plans to continue exercise at  [employer] (comment)   walking and [employer]   Frequency  Add 2 additional days to program exercise sessions.    Initial Home Exercises Provided  06/19/18       Nutrition:  Target Goals: Understanding of nutrition guidelines, daily intake of sodium <1531m, cholesterol <2045m calories 30% from fat and 7% or less from saturated fats, daily to have 5 or more servings of fruits and vegetables.  Biometrics:    Nutrition Therapy Plan and Nutrition Goals: Nutrition Therapy & Goals - 11/19/18 1524      Nutrition Therapy   Diet  Low Sodium, HH diet    Protein (specify units)  60-65g    Fiber  25 grams    Whole Grain Foods  3 servings    Saturated Fats  12 max. grams    Fruits and Vegetables  5 servings/day    Sodium  1.5 grams      Personal Nutrition Goals    Nutrition Goal  ST: continue with HH eating, lose about 2 lbs LT: lose 10-15lbs    Comments  Discussed HH eating and [employer]. Pt eats low fat yogurt with peaches 2 cups of coffee (sometimes black and  sometimes with half and half), salad with chicken, mixes it up for dinner chicken with olive oil baked, salsa, and [place] cheese, or chili with beans and veggies. Pt will eat whole grains, lean protein, beans, fruits and vegetables. Pt does not fear going out to eat and will try to make healthy modifications.      Intervention Plan   Intervention  Prescribe, educate and counsel regarding individualized specific dietary modifications aiming towards targeted core components such as weight, hypertension, lipid management, diabetes, heart failure and other comorbidities.    Expected Outcomes  Short Term Goal: Understand basic principles of dietary content, such as calories, fat, sodium, cholesterol and nutrients.;Short Term Goal: A plan has been developed with personal nutrition goals set during dietitian appointment.;Long Term Goal: Adherence to prescribed nutrition plan.       Nutrition Assessments:   Nutrition Goals Re-Evaluation: Nutrition Goals Re-Evaluation    [place] Name 12/22/18 1442             Goals   Nutrition Goal  ST: continue with HH eating, lose about 2 lbs LT: lose 10-15lbs       Comment  Monica Bush is eating a heart healthy diet. She makes a spinach omelet for breakfast. She is trying to stop using creamer in her coffee; now only uses it with cheap coffee that doesn't taste good black. Went on vk last week and ate what her family made. Pt reports she is doing well and experimenting with new recipes (most recently stuffed zucchini) to get a variety of foods and keep it interesting.       Expected Outcome  Short: Continue healthy eating and making small changes. Long: Continue cooking small servings since she lives alone.          Nutrition Goals Discharge (Final Nutrition Goals  Re-Evaluation): Nutrition Goals Re-Evaluation - 12/22/18 1442      Goals   Nutrition Goal  ST: continue with HH eating, lose about 2 lbs LT: lose 10-15lbs    Comment  Monica Bush is eating a heart healthy diet. She makes a spinach omelet for breakfast. She is trying to stop using creamer in her coffee; now only uses it with cheap coffee that doesn't taste good black. Went on vk last week and ate what her family made. Pt reports she is doing well and experimenting with new recipes (most recently stuffed zucchini) to get a variety of foods and keep it interesting.    Expected Outcome  Short: Continue healthy eating and making small changes. Long: Continue cooking small servings since she lives alone.       Psychosocial: Target Goals: Acknowledge presence or absence of significant depression and/or stress, maximize coping skills, provide positive support system. Participant is able to verbalize types and ability to use techniques and skills needed for reducing stress and depression.   Initial Review & Psychosocial Screening:   Quality of Life Scores:   Scores of 19 and below usually indicate a poorer quality of life in these areas.  A difference of  2-3 points is a clinically meaningful difference.  A difference of 2-3 points in the total score of the Quality of Life Index has been associated with significant improvement in overall quality of life, self-image, physical symptoms, and general health in studies assessing change in quality of life.  PHQ-9: Recent Review Flowsheet Data    Depression screen [facility] 2/9 05/29/2018   Decreased Interest 0   Down, Depressed, Hopeless 0   PHQ - 2 Score 0  Altered sleeping 0   Tired, decreased energy 1   Change in appetite 1   Feeling bad or failure about yourself  1   Trouble concentrating 0   Moving slowly or fidgety/restless 0   Suicidal thoughts 0   PHQ-9 Score 3   Difficult doing work/chores Somewhat difficult     Interpretation of Total Score   Total Score Depression Severity:  1-4 = Minimal depression, 5-9 = Mild depression, 10-14 = Moderate depression, 15-19 = Moderately severe depression, 20-27 = Severe depression   Psychosocial Evaluation and Intervention:   Psychosocial Re-Evaluation: Psychosocial Re-Evaluation    [place] Name 08/11/18 1140 09/22/18 1307 10/14/18 1214 12/22/18 1425       Psychosocial Re-Evaluation   Current issues with  Current Stress Concerns  Current Stress Concerns  Current Stress Concerns  Current Stress Concerns    Comments  Monica Bush has continued to do well at home.  She is getting out to exercise and staying positvie.  She has enjoyed the calls and videos to stay connected.    She is limiting her outings to get the essentials and lower her exposure risk.  She has found that Monica Bush chocolate chips helps her get her chocolate fix at less caloreies.   Monica Bush has been doing well at home.  She is currently out on a hunt for more toilet paper.  She said that Monica Bush out in 30 minutes yesterday! She is still limiting her outings for essentials.  She is also still stress eating.  After we talked last time, she got very sick for a couple of weeks and was even tested for COVID-19 but was negative.  She is now feeling better.   Daira has been doing good.  She has been working out in her yard and walking again.  She is back to walking with her neighbor and checking on them.   Monica Bush is doing better with her stress, relationships are improving and finances are improving and she is happier.  She is walking outside and gets out in the yard to help too.  She continues to sleep well since she cut out the chocolate at night.    Expected Outcomes  Short: Continue to get exercise  Long: Continue to stay postive.   Short: Continue to recover.  Long: Continue to stay postive.   Short: Continue to connect with neighbors.  Long: Continue to stay positive.   Short: Continue to get out side for stress relief.  Long: Continue to stay  positive.    Interventions  Encouraged to attend Cardiac Rehabilitation for the exercise  Encouraged to attend Cardiac Rehabilitation for the exercise  -  Encouraged to attend Cardiac Rehabilitation for the exercise    Continue Psychosocial Services   Follow up required by staff  Follow up required by staff  -  Follow up required by staff       Psychosocial Discharge (Final Psychosocial Re-Evaluation): Psychosocial Re-Evaluation - 12/22/18 1425      Psychosocial Re-Evaluation   Current issues with  Current Stress Concerns    Comments  Monica Bush is doing better with her stress, relationships are improving and finances are improving and she is happier.  She is walking outside and gets out in the yard to help too.  She continues to sleep well since she cut out the chocolate at night.    Expected Outcomes  Short: Continue to get out side for stress relief.  Long: Continue to stay positive.    Interventions  Encouraged to attend Cardiac Rehabilitation for the exercise    Continue Psychosocial Services   Follow up required by staff       Vocational Rehabilitation: Provide vocational rehab assistance to qualifying candidates.   Vocational Rehab Evaluation & Intervention:   Education: Education Goals: Education classes will be provided on a variety of topics geared toward better understanding of heart health and risk factor modification. Participant will state understanding/return demonstration of topics presented as noted by education test scores.  Learning Barriers/Preferences:   Education Topics:  AED/CPR: - Group verbal and written instruction with the use of models to demonstrate the basic use of the AED with the basic ABC's of resuscitation.   General Nutrition Guidelines/Fats and Fiber: -Group instruction provided by verbal, written material, models and posters to present the general guidelines for heart healthy nutrition. Gives an explanation and review of dietary fats and  fiber.   Controlling Sodium/Reading Food Labels: -Group verbal and written material supporting the discussion of sodium use in heart healthy nutrition. Review and explanation with models, verbal and written materials for utilization of the food label.   Exercise Physiology & General Exercise Guidelines: - Group verbal and written instruction with models to review the exercise physiology of the cardiovascular system and associated critical values. Provides general exercise guidelines with specific guidelines to those with heart or lung disease.    Aerobic Exercise & Resistance Training: - Gives group verbal and written instruction on the various components of exercise. Focuses on aerobic and resistive training programs and the benefits of this training and how to safely progress through these programs..   Flexibility, Balance, Mind/Body Relaxation: Provides group verbal/written instruction on the benefits of flexibility and balance training, including mind/body exercise modes such as yoga, pilates and tai chi.  Demonstration and skill practice provided.   Stress and Anxiety: - Provides group verbal and written instruction about the health risks of elevated stress and causes of high stress.  Discuss the correlation between heart/lung disease and anxiety and treatment options. Review healthy ways to manage with stress and anxiety.   Cardiac Rehab from 07/10/2018 in [facility] Cardiac and Pulmonary Rehab  Date  06/24/18  Educator  [facility]  Instruction Review Code  1- Verbalizes Understanding      Depression: - Provides group verbal and written instruction on the correlation between heart/lung disease and depressed mood, treatment options, and the stigmas associated with seeking treatment.   Cardiac Rehab from 07/10/2018 in [facility] Cardiac and Pulmonary Rehab  Date  07/08/18  Educator  [facility]  Instruction Review Code  1- Verbalizes Understanding      Anatomy & Physiology of the Heart: - Group verbal and  written instruction and models provide basic cardiac anatomy and physiology, with the coronary electrical and arterial systems. Review of Valvular disease and Heart Failure   Cardiac Rehab from 07/10/2018 in [facility] Cardiac and Pulmonary Rehab  Date  06/12/18  Educator  CE  Instruction Review Code  1- Verbalizes Understanding      Cardiac Procedures: - Group verbal and written instruction to review commonly prescribed medications for heart disease. Reviews the medication, class of the drug, and side effects. Includes the steps to properly store meds and maintain the prescription regimen. (beta blockers and nitrates)   Cardiac Rehab from 07/10/2018 in [facility] Cardiac and Pulmonary Rehab  Date  06/26/18  Educator  CE  Instruction Review Code  1- Verbalizes Understanding      Cardiac Medications I: - Group verbal and written instruction to  review commonly prescribed medications for heart disease. Reviews the medication, class of the drug, and side effects. Includes the steps to properly store meds and maintain the prescription regimen.   Cardiac Medications II: -Group verbal and written instruction to review commonly prescribed medications for heart disease. Reviews the medication, class of the drug, and side effects. (all other drug classes)   Cardiac Rehab from 07/10/2018 in [facility] Cardiac and Pulmonary Rehab  Date  07/10/18  Educator  CE  Instruction Review Code  1- Verbalizes Understanding       Go Sex-Intimacy & Heart Disease, Get SMART - Goal Setting: - Group verbal and written instruction through game format to discuss heart disease and the return to sexual intimacy. Provides group verbal and written material to discuss and apply goal setting through the application of the S.M.A.R.T. Method.   Cardiac Rehab from 07/10/2018 in [facility] Cardiac and Pulmonary Rehab  Date  06/26/18  Educator  CE  Instruction Review Code  1- Verbalizes Understanding      Other Matters of the Heart: - Provides  group verbal, written materials and models to describe Stable Angina and Peripheral Artery. Includes description of the disease process and treatment options available to the cardiac patient.   Exercise & Equipment Safety: - Individual verbal instruction and demonstration of equipment use and safety with use of the equipment.   Cardiac Rehab from 07/10/2018 in [facility] Cardiac and Pulmonary Rehab  Date  05/29/18  Educator  [facility]  Instruction Review Code  1- Verbalizes Understanding      Infection Prevention: - Provides verbal and written material to individual with discussion of infection control including proper hand washing and proper equipment cleaning during exercise session.   Cardiac Rehab from 07/10/2018 in [facility] Cardiac and Pulmonary Rehab  Date  05/29/18  Educator  [facility]  Instruction Review Code  1- Verbalizes Understanding      Falls Prevention: - Provides verbal and written material to individual with discussion of falls prevention and safety.   Cardiac Rehab from 07/10/2018 in [facility] Cardiac and Pulmonary Rehab  Date  05/29/18  Educator  [facility]  Instruction Review Code  1- Verbalizes Understanding      Diabetes: - Individual verbal and written instruction to review signs/symptoms of diabetes, desired ranges of glucose level fasting, after meals and with exercise. Acknowledge that pre and post exercise glucose checks will be done for 3 sessions at entry of program.   Know Your Numbers and Risk Factors: -Group verbal and written instruction about important numbers in your health.  Discussion of what are risk factors and how they play a role in the disease process.  Review of Cholesterol, Blood Pressure, Diabetes, and BMI and the role they play in your overall health.   Cardiac Rehab from 07/10/2018 in [facility] Cardiac and Pulmonary Rehab  Date  07/10/18  Educator  CE  Instruction Review Code  1- Verbalizes Understanding      Sleep Hygiene: -Provides group verbal and written instruction about  how sleep can affect your health.  Define sleep hygiene, discuss sleep cycles and impact of sleep habits. Review good sleep hygiene tips.    Cardiac Rehab from 07/10/2018 in [facility] Cardiac and Pulmonary Rehab  Date  06/10/18  Educator  [facility]  Instruction Review Code  1- Verbalizes Understanding      Other: -Provides group and verbal instruction on various topics (see comments)   Knowledge Questionnaire Score:   Core Components/Risk Factors/Patient Goals at Admission:   Core Components/Risk Factors/Patient Goals  Review:  Goals and Risk Factor Review    Row Name 08/11/18 1143 09/22/18 1311 10/14/18 1215 12/22/18 1427       Core Components/Risk Factors/Patient Goals Review   Personal Goals Review  Weight Management/Obesity;Lipids;Hypertension  Weight Management/Obesity;Lipids;Hypertension  Weight Management/Obesity;Lipids;Hypertension  Weight Management/Obesity;Lipids;Hypertension    Review  Isabellah has been doing well at home.  She said that her weight is up some due to stress eating and from being home.  She has tried to find some better snack options and went to store after talking with Monica Bush to get some new healthier things to try.  Other than her weight, her number at home have been doing well.    Princes has been doing well.  Her weight continues to be up some due to stress eating (now on Monica Bush).  She is trying to be good otherwise though.  Her numbers have been pretty good.    Monica Bush is doing well.  She is motivated to lose weight and has restarted her diet!! Her pressures have continued to do well.   Monica Bush is doing well.  She gained weight while on vacation but it is already starting to go back down again.  Her pressures continue to do well at home and in class.  She is doing well on her meds.  She is ready to get off her Birlinta due to the bruising.    Expected Outcomes  Short: Continue to check pressures.  Long: Continue to work on [employer]  Short: Continue to work on stress eating.   Long: Continue to monitor risk factors.   Short: Lose weight! Long: Continue to monitor risk factors.   Short: Continue to work on weight loss.  Long: Continue to monitor risk factors.       Core Components/Risk Factors/Patient Goals at Discharge (Final Review):  Goals and Risk Factor Review - 12/22/18 1427      Core Components/Risk Factors/Patient Goals Review   Personal Goals Review  Weight Management/Obesity;Lipids;Hypertension    Review  Monica Bush is doing well.  She gained weight while on vacation but it is already starting to go back down again.  Her pressures continue to do well at home and in class.  She is doing well on her meds.  She is ready to get off her Birlinta due to the bruising.    Expected Outcomes  Short: Continue to work on weight loss.  Long: Continue to monitor risk factors.       ITP Comments: ITP Comments    Row Name 07/23/18 1150 07/30/18 1219 09/01/18 1110 11/19/18 1417 12/17/18 0633   ITP Comments  Monica Bush has been out sick for the past week.  Our program is currently closed due to COVID-19.  We are communicating with patient via phone calls and emails.      30 day review. Continue with ITP unless directed changes by Medical Director chart review.  Called to check on patient.  Left message, sent email.   30 day review cycle restarting  after being closed since March 16 because of  Covid 19 pandemic. Program opened to patients on July 6. Not all have returned. ITP updated and sent to Medical Director for review,changes as needed and signature  30 Day Review Completed today. Continue with ITP unless changed by Medical Director review.   Row Name 12/24/18 1543 01/14/19 0647         ITP Comments  [name] had some SOB and diaphoresis while on the Rec Bike,her second  piece of equipment today..   She slowed down her workload and symptoms improved.  She then stated"I just don't feel right."  BP, HR, Blood sugar all checked and in normal range.  Then she told us that she had  5-6 bouts of diarrhea yesterday.  Exercise stopped for the day. Lurlie called someone to come and take her home with them, so she will not be alone.  She was advised to call her MD or go to ED if her feeling of not felling right continues.  30 Day review. Continue with ITP unless directed changes per Medical Director review.         Comments:

## 2019-01-14 NOTE — Progress Notes (Signed)
Daily Session Note  Patient Details  Name: Monica Bush MRN: [id number] Date of Birth: 1940/06/19 Referring Provider:     Cardiac Rehab from 05/29/2018 in [facility] Cardiac and Pulmonary Rehab  Referring Provider  kelsey      Encounter Date: 01/14/2019  Check In: Session Check In - 01/14/19 1405      Check-In   Supervising physician immediately available to respond to emergencies  See telemetry face sheet for immediately available ER MD    Location  [facility]-Cardiac & Pulmonary Rehab    Staff Present  Renita Papa, RN Vickki Hearing, BA, ACSM CEP, Exercise Physiologist;Joseph Tessie Fass RCP,RRT,BSRT    Virtual Visit  No    Medication changes reported      No    Fall or balance concerns reported     No    Warm-up and Cool-down  Performed on first and last piece of equipment    Resistance Training Performed  Yes      Pain Assessment   Currently in Pain?  No/denies          Social History   Tobacco Use  Smoking Status Never Smoker  Smokeless Tobacco Never Used    Goals Met:  Independence with exercise equipment Exercise tolerated well No report of cardiac concerns or symptoms Strength training completed today  Goals Unmet:  Not Applicable  Comments: Pt able to follow exercise prescription today without complaint.  Will continue to monitor for progression.    Dr. Emily Filbert is Medical Director for [place] and [facility].

## 2019-01-15 ENCOUNTER — Encounter: Payer: Medicare Other | Admitting: *Deleted

## 2019-01-15 VITALS — Ht 61.5 in | Wt 160.8 lb

## 2019-01-15 DIAGNOSIS — Z955 Presence of coronary angioplasty implant and graft: Secondary | ICD-10-CM | POA: Diagnosis not present

## 2019-01-15 DIAGNOSIS — Z9861 Coronary angioplasty status: Secondary | ICD-10-CM

## 2019-01-15 NOTE — Progress Notes (Signed)
Daily Session Note  Patient Details  Name: Monica Bush MRN: [id number] Date of Birth: Mar 10, 1941 Referring Provider:     Cardiac Rehab from 05/29/2018 in [facility] Cardiac and Pulmonary Rehab  Referring Provider  kelsey      Encounter Date: 01/15/2019  Check In: Session Check In - 01/15/19 1402      Check-In   Supervising physician immediately available to respond to emergencies  See telemetry face sheet for immediately available ER MD    Location  [facility]-Cardiac & Pulmonary Rehab    Staff Present  Renita Papa, RN BSN;Jessica Luan Pulling, MA, RCEP, CCRP, CCET;Joseph Somers RCP,RRT,BSRT    Virtual Visit  No    Medication changes reported      No    Fall or balance concerns reported     No    Warm-up and Cool-down  Performed on first and last piece of equipment    Resistance Training Performed  Yes    VAD Patient?  No      Pain Assessment   Currently in Pain?  No/denies          Social History   Tobacco Use  Smoking Status Never Smoker  Smokeless Tobacco Never Used    Goals Met:  Independence with exercise equipment Exercise tolerated well No report of cardiac concerns or symptoms Strength training completed today  Goals Unmet:  Not Applicable  Comments: Pt able to follow exercise prescription today without complaint.  Will continue to monitor for progression.  [place] Name 01/15/19 1414         6 Minute Walk   Phase  Discharge     Distance  1315 feet     Distance % Change  7.08 %     Distance Feet Change  87 ft     Walk Time  6 minutes     # of Rest Breaks  0     MPH  2.49     METS  2.44     RPE  11     VO2 Peak  8.55     Symptoms  Yes (comment)     Comments  hips hurt at end     Resting HR  70 bpm     Resting BP  126/64     Max Ex. HR  110 bpm     Max Ex. BP  128/70         Dr. Emily Filbert is Medical Director for [place] and [facility].

## 2019-01-19 ENCOUNTER — Encounter: Payer: Medicare Other | Admitting: *Deleted

## 2019-01-19 ENCOUNTER — Other Ambulatory Visit: Payer: Self-pay

## 2019-01-19 DIAGNOSIS — Z955 Presence of coronary angioplasty implant and graft: Secondary | ICD-10-CM

## 2019-01-19 DIAGNOSIS — Z9861 Coronary angioplasty status: Secondary | ICD-10-CM

## 2019-01-19 NOTE — Progress Notes (Signed)
Daily Session Note  Patient Details  Name: Aruna Nestler MRN: [id number] Date of Birth: Sep 03, 1940 Referring Provider:     Cardiac Rehab from 05/29/2018 in [facility] Cardiac and Pulmonary Rehab  Referring Provider  kelsey      Encounter Date: 01/19/2019  Check In: Session Check In - 01/19/19 1408      Check-In   Supervising physician immediately available to respond to emergencies  See telemetry face sheet for immediately available ER MD    Location  [facility]-Cardiac & Pulmonary Rehab    Staff Present  Renita Papa, RN BSN;Jessica Luan Pulling, MA, RCEP, CCRP, CCET;Joseph Milaca RCP,RRT,BSRT    Virtual Visit  No    Medication changes reported      No    Fall or balance concerns reported     No    Warm-up and Cool-down  Performed on first and last piece of equipment    Resistance Training Performed  Yes    VAD Patient?  No    PAD/SET Patient?  No      Pain Assessment   Currently in Pain?  No/denies          Social History   Tobacco Use  Smoking Status Never Smoker  Smokeless Tobacco Never Used    Goals Met:  Independence with exercise equipment Exercise tolerated well No report of cardiac concerns or symptoms Strength training completed today  Goals Unmet:  Not Applicable  Comments: Pt able to follow exercise prescription today without complaint.  Will continue to monitor for progression.    Dr. Emily Filbert is Medical Director for [place] and [facility].

## 2019-01-21 ENCOUNTER — Encounter: Payer: Medicare Other | Admitting: *Deleted

## 2019-01-21 ENCOUNTER — Other Ambulatory Visit: Payer: Self-pay

## 2019-01-21 DIAGNOSIS — Z9861 Coronary angioplasty status: Secondary | ICD-10-CM

## 2019-01-21 DIAGNOSIS — Z955 Presence of coronary angioplasty implant and graft: Secondary | ICD-10-CM | POA: Diagnosis not present

## 2019-01-21 NOTE — Progress Notes (Signed)
Daily Session Note  Patient Details  Name: Monica Bush MRN: [id number] Date of Birth: [birth year] Referring Provider:     Cardiac Rehab from 05/29/2018 in [facility] Cardiac and Pulmonary Rehab  Referring Provider  kelsey      Encounter Date: 01/21/2019  Check In: Session Check In - 01/21/19 1416      Check-In   Supervising physician immediately available to respond to emergencies  See telemetry face sheet for immediately available ER MD    Location  [facility]-Cardiac & Pulmonary Rehab    Staff Present  Renita Papa, RN Vickki Hearing, BA, ACSM CEP, Exercise Physiologist;Joseph Tessie Fass RCP,RRT,BSRT    Virtual Visit  No    Medication changes reported      No    Fall or balance concerns reported     No    Warm-up and Cool-down  Performed on first and last piece of equipment    Resistance Training Performed  Yes    VAD Patient?  No    PAD/SET Patient?  No      Pain Assessment   Currently in Pain?  No/denies          Social History   Tobacco Use  Smoking Status Never Smoker  Smokeless Tobacco Never Used    Goals Met:  Independence with exercise equipment Exercise tolerated well No report of cardiac concerns or symptoms Strength training completed today  Goals Unmet:  Not Applicable  Comments: Pt able to follow exercise prescription today without complaint.  Will continue to monitor for progression.    Dr. Emily Filbert is Medical Director for [place] and [facility].

## 2019-01-21 NOTE — Patient Instructions (Signed)
Discharge Patient Instructions  Patient Details  Name: Monica Bush MRN: [id number] Date of Birth: 1940/07/06 Referring Provider:  Corey Skains,*   Number of Visits: 36  Reason for Discharge:  Patient reached a stable level of exercise. Patient independent in their exercise. Patient has met program and personal goals.  Smoking History:  Social History   Tobacco Use  Smoking Status Never Smoker  Smokeless Tobacco Never Used    Diagnosis:  S/P PTCA (percutaneous transluminal coronary angioplasty)  Status post coronary artery stent placement  Initial Exercise Prescription:   Discharge Exercise Prescription (Final Exercise Prescription Changes): Exercise Prescription Changes - 01/20/19 1200      Response to Exercise   Blood Pressure (Admit)  124/64    Blood Pressure (Exercise)  128/70    Blood Pressure (Exit)  116/66    Heart Rate (Admit)  80 bpm    Heart Rate (Exercise)  109 bpm    Heart Rate (Exit)  79 bpm    Rating of Perceived Exertion (Exercise)  12    Symptoms  none    Duration  Continue with 30 min of aerobic exercise without signs/symptoms of physical distress.    Intensity  THRR unchanged      Progression   Progression  Continue to progress workloads to maintain intensity without signs/symptoms of physical distress.    Average METs  3.19      Resistance Training   Training Prescription  Yes    Weight  3 lbs    Reps  10-15      Interval Training   Interval Training  No      Treadmill   MPH  2.3    Grade  1    Minutes  15    METs  3.08      Recumbant Bike   Level  3.1    Minutes  15    METs  3.8      NuStep   Level  4    Minutes  15    METs  2.7      Home Exercise Plan   Plans to continue exercise at  [employer] (comment)   walking and [employer]   Frequency  Add 2 additional days to program exercise sessions.    Initial Home Exercises Provided  06/19/18       Functional Capacity: 6 Minute Walk    Row Name  01/15/19 1414         6 Minute Walk   Phase  Discharge     Distance  1315 feet     Distance % Change  7.08 %     Distance Feet Change  87 ft     Walk Time  6 minutes     # of Rest Breaks  0     MPH  2.49     METS  2.44     RPE  11     VO2 Peak  8.55     Symptoms  Yes (comment)     Comments  hips hurt at end     Resting HR  70 bpm     Resting BP  126/64     Max Ex. HR  110 bpm     Max Ex. BP  128/70        Quality of Life: Quality of Life - 01/15/19 1532      Quality of Life   Select  Quality of Life      Quality of  Life Scores   Health/Function Post  28.4 %    Socioeconomic Post  29.64 %    Psych/Spiritual Post  30 %    Family Post  27.5 %    GLOBAL Post  28.85 %       Personal Goals: Goals established at orientation with interventions provided to work toward goal.    Personal Goals Discharge: Goals and Risk Factor Review - 12/22/18 1427      Core Components/Risk Factors/Patient Goals Review   Personal Goals Review  Weight Management/Obesity;Lipids;Hypertension    Review  Monica Bush is doing well.  She gained weight while on vacation but it is already starting to go back down again.  Her pressures continue to do well at home and in class.  She is doing well on her meds.  She is ready to get off her Birlinta due to the bruising.    Expected Outcomes  Short: Continue to work on weight loss.  Long: Continue to monitor risk factors.       Exercise Goals and Review:   Exercise Goals Re-Evaluation: Exercise Goals Re-Evaluation    Row Name 08/11/18 1135 09/22/18 1305 10/14/18 1212 11/26/18 1509 12/08/18 1215     Exercise Goal Re-Evaluation   Exercise Goals Review  Increase Physical Activity;Increase Strength and Stamina;Understanding of Exercise Prescription  Increase Physical Activity;Increase Strength and Stamina;Understanding of Exercise Prescription  Increase Physical Activity;Increase Strength and Stamina;Understanding of Exercise Prescription  Increase Physical  Activity;Increase Strength and Stamina;Understanding of Exercise Prescription  Increase Physical Activity;Increase Strength and Stamina;Understanding of Exercise Prescription   Comments  Monica Bush is doing well at home.  She is exercsing everyday. She goes for a walk with a friend from church, maintaining distance, to get out of the house.  She is also using our videos each day.  She has pushed her dining room table out of the way and using weights as well.   We will continue to monitor her progress  Monica Bush has been doing her walking still, but it is starting to hurt her hip. She has been heating and icing her hip; we also talked about some options to help stretch that area.  She is also using our videos and enjoys watching them.  Seh continues to get in her weights as well.   Monica Bush is doing well.  She is feeling better, the hip stretches really made a difference and she is back to walking again!!  Monica Bush has been doing well in rehab.  She has missed this week with illness.  She has been able to return to rehab at her workloads from before.  We will continue to monitor her progress.  Monica Bush is doing well in rehab.  She is still recovering from her illness. She continues to rebuild her stamina.  We will continue to monitor her progress.   Expected Outcomes  Short: Continue to walk and use videos each day.  Long: Continue to stay active at home.   Short: Stretch out hip more.  Long: Continue to stay active using videos.   Short: Continue to stretch hip.  Long: Continue to walk and use videos  Short: Return to rehab regularly.  Long: Continue to walk on off day  Short: Return to rehab regulalry again. Long: Continue to increase stamina.   [place] Name 12/22/18 1419 12/25/18 1536 01/06/19 1228 01/20/19 1221       Exercise Goal Re-Evaluation   Exercise Goals Review  Increase Physical Activity;Increase Strength and Stamina;Understanding of Exercise Prescription  Increase Physical  Activity;Increase Strength and  Stamina;Understanding of Exercise Prescription  Increase Physical Activity;Increase Strength and Stamina;Understanding of Exercise Prescription  Increase Physical Activity;Increase Strength and Stamina;Understanding of Exercise Prescription    Comments  Monica Bush is doing well in rehab.  She is exercising two extra days at home.  She usually walks and does stretches to stay active.  Her stamina continues to improve.  However, her grandkids continue to make her tired.  She would like to join the [employer] when she graduates.  Monica Bush continues to do well in rehab.  She is nearing graduation and will be doing her post 6MWT soon.  We expect to see some improvement.  We will continue to monitor her progress.  Monica Bush is set to graduate on Thursday!!  She improved her walk test by 87 ft!!  She is planning to join the [place] after graduation once they open to new members.  She has really enjoyed getting back into a routine.    Expected Outcomes  Short: Continue to exercise on days off.  Long: Continue to improve stamina.  -  Short: Improve post 6MWT.  Long: Continue to exercise independently  Short: Graduate!!  Long: Continue to exercise independently!!       Nutrition & Weight - Outcomes:  Post Biometrics - 01/15/19 1416       Post  Biometrics   Height  5' 1.5" (1.562 m)    Weight  160 lb 12.8 oz (72.9 kg)    BMI (Calculated)  29.89    Single Leg Stand  15.12 seconds       Nutrition: Nutrition Therapy & Goals - 11/19/18 1524      Nutrition Therapy   Diet  Low Sodium, HH diet    Protein (specify units)  60-65g    Fiber  25 grams    Whole Grain Foods  3 servings    Saturated Fats  12 max. grams    Fruits and Vegetables  5 servings/day    Sodium  1.5 grams      Personal Nutrition Goals   Nutrition Goal  ST: continue with HH eating, lose about 2 lbs LT: lose 10-15lbs    Comments  Discussed HH eating and [employer]. Pt eats low fat yogurt with peaches 2 cups of coffee (sometimes black and sometimes  with half and half), salad with chicken, mixes it up for dinner chicken with olive oil baked, salsa, and [place] cheese, or chili with beans and veggies. Pt will eat whole grains, lean protein, beans, fruits and vegetables. Pt does not fear going out to eat and will try to make healthy modifications.      Intervention Plan   Intervention  Prescribe, educate and counsel regarding individualized specific dietary modifications aiming towards targeted core components such as weight, hypertension, lipid management, diabetes, heart failure and other comorbidities.    Expected Outcomes  Short Term Goal: Understand basic principles of dietary content, such as calories, fat, sodium, cholesterol and nutrients.;Short Term Goal: A plan has been developed with personal nutrition goals set during dietitian appointment.;Long Term Goal: Adherence to prescribed nutrition plan.       Nutrition Discharge:   Education Questionnaire Score: Knowledge Questionnaire Score - 01/15/19 1532      Knowledge Questionnaire Score   Post Score  24/26       Goals reviewed with patient; copy given to patient.

## 2019-01-22 ENCOUNTER — Encounter: Payer: Medicare Other | Admitting: *Deleted

## 2019-01-22 DIAGNOSIS — Z9861 Coronary angioplasty status: Secondary | ICD-10-CM

## 2019-01-22 DIAGNOSIS — Z955 Presence of coronary angioplasty implant and graft: Secondary | ICD-10-CM | POA: Diagnosis not present

## 2019-01-22 NOTE — Progress Notes (Signed)
Discharge Progress Report  Patient Details  Name: Monica Bush MRN: [id number] Date of Birth: 11-09-1940 Referring Provider:     Cardiac Rehab from 05/29/2018 in [facility] Cardiac and Pulmonary Rehab  Referring Provider  Monica Bush       Number of Visits: 14  Reason for Discharge:  Patient reached a stable level of exercise. Patient independent in their exercise. Patient has met program and personal goals.  Smoking History:  Social History   Tobacco Use  Smoking Status Never Smoker  Smokeless Tobacco Never Used    Diagnosis:  S/P PTCA (percutaneous transluminal coronary angioplasty)  Status post coronary artery stent placement  ADL UCSD:   Initial Exercise Prescription:   Discharge Exercise Prescription (Final Exercise Prescription Changes): Exercise Prescription Changes - 01/20/19 1200      Response to Exercise   Blood Pressure (Admit)  124/64    Blood Pressure (Exercise)  128/70    Blood Pressure (Exit)  116/66    Heart Rate (Admit)  80 bpm    Heart Rate (Exercise)  109 bpm    Heart Rate (Exit)  79 bpm    Rating of Perceived Exertion (Exercise)  12    Symptoms  none    Duration  Continue with 30 min of aerobic exercise without signs/symptoms of physical distress.    Intensity  THRR unchanged      Progression   Progression  Continue to progress workloads to maintain intensity without signs/symptoms of physical distress.    Average METs  3.19      Resistance Training   Training Prescription  Yes    Weight  3 lbs    Reps  10-15      Interval Training   Interval Training  No      Treadmill   MPH  2.3    Grade  1    Minutes  15    METs  3.08      Recumbant Bike   Level  3.1    Minutes  15    METs  3.8      NuStep   Level  4    Minutes  15    METs  2.7      Home Exercise Plan   Plans to continue exercise at  [employer] (comment)   walking and [employer]   Frequency  Add 2 additional days to program exercise sessions.    Initial Home  Exercises Provided  06/19/18       Functional Capacity: 6 Minute Walk    Row Name 01/15/19 1414         6 Minute Walk   Phase  Discharge     Distance  1315 feet     Distance % Change  7.08 %     Distance Feet Change  87 ft     Walk Time  6 minutes     # of Rest Breaks  0     MPH  2.49     METS  2.44     RPE  11     VO2 Peak  8.55     Symptoms  Yes (comment)     Comments  hips hurt at end     Resting HR  70 bpm     Resting BP  126/64     Max Ex. HR  110 bpm     Max Ex. BP  128/70        Psychological, QOL, Others - Outcomes: PHQ 2/9: Depression  screen [facility] 2/9 01/15/2019 05/29/2018  Decreased Interest 0 0  Down, Depressed, Hopeless 0 0  PHQ - 2 Score 0 0  Altered sleeping 1 0  Tired, decreased energy 1 1  Change in appetite 0 1  Feeling bad or failure about yourself  0 1  Trouble concentrating 0 0  Moving slowly or fidgety/restless 0 0  Suicidal thoughts 0 0  PHQ-9 Score 2 3  Difficult doing work/chores Not difficult at all Somewhat difficult    Quality of Life: Quality of Life - 01/15/19 1532      Quality of Life   Select  Quality of Life      Quality of Life Scores   Health/Function Post  28.4 %    Socioeconomic Post  29.64 %    Psych/Spiritual Post  30 %    Family Post  27.5 %    GLOBAL Post  28.85 %       Personal Goals: Goals established at orientation with interventions provided to work toward goal.    Personal Goals Discharge: Goals and Risk Factor Review    Row Name 08/11/18 1143 09/22/18 1311 10/14/18 1215 12/22/18 1427       Core Components/Risk Factors/Patient Goals Review   Personal Goals Review  Weight Management/Obesity;Lipids;Hypertension  Weight Management/Obesity;Lipids;Hypertension  Weight Management/Obesity;Lipids;Hypertension  Weight Management/Obesity;Lipids;Hypertension    Review  Monica Bush has been doing well at home.  She said that her weight is up some due to stress eating and from being home.  She has tried to find some better  snack options and went to store after talking with Melissa to get some new healthier things to try.  Other than her weight, her number at home have been doing well.    Monica Bush has been doing well.  Her weight continues to be up some due to stress eating (now on [place] and [employer]).  She is trying to be good otherwise though.  Her numbers have been pretty good.    Monica Bush is doing well.  She is motivated to lose weight and has restarted her diet!! Her pressures have continued to do well.   Monica Bush is doing well.  She gained weight while on vacation but it is already starting to go back down again.  Her pressures continue to do well at home and in class.  She is doing well on her meds.  She is ready to get off her Birlinta due to the bruising.    Expected Outcomes  Short: Continue to check pressures.  Long: Continue to work on [employer]  Short: Continue to work on stress eating.  Long: Continue to monitor risk factors.   Short: Lose weight! Long: Continue to monitor risk factors.   Short: Continue to work on weight loss.  Long: Continue to monitor risk factors.       Exercise Goals and Review:   Exercise Goals Re-Evaluation: Exercise Goals Re-Evaluation    Row Name 08/11/18 1135 09/22/18 1305 10/14/18 1212 11/26/18 1509 12/08/18 1215     Exercise Goal Re-Evaluation   Exercise Goals Review  Increase Physical Activity;Increase Strength and Stamina;Understanding of Exercise Prescription  Increase Physical Activity;Increase Strength and Stamina;Understanding of Exercise Prescription  Increase Physical Activity;Increase Strength and Stamina;Understanding of Exercise Prescription  Increase Physical Activity;Increase Strength and Stamina;Understanding of Exercise Prescription  Increase Physical Activity;Increase Strength and Stamina;Understanding of Exercise Prescription   Comments  Monica Bush is doing well at home.  She is exercsing everyday. She goes for a walk with a friend from church,  maintaining distance, to get out of  the house.  She is also using our videos each day.  She has pushed her dining room table out of the way and using weights as well.   We will continue to monitor her progress  Monica Bush has been doing her walking still, but it is starting to hurt her hip. She has been heating and icing her hip; we also talked about some options to help stretch that area.  She is also using our videos and enjoys watching them.  Seh continues to get in her weights as well.   Maya is doing well.  She is feeling better, the hip stretches really made a difference and she is back to walking again!!  Weda has been doing well in rehab.  She has missed this week with illness.  She has been able to return to rehab at her workloads from before.  We will continue to monitor her progress.  Alfreda is doing well in rehab.  She is still recovering from her illness. She continues to rebuild her stamina.  We will continue to monitor her progress.   Expected Outcomes  Short: Continue to walk and use videos each day.  Long: Continue to stay active at home.   Short: Stretch out hip more.  Long: Continue to stay active using videos.   Short: Continue to stretch hip.  Long: Continue to walk and use videos  Short: Return to rehab regularly.  Long: Continue to walk on off day  Short: Return to rehab regulalry again. Long: Continue to increase stamina.   [place] Name 12/22/18 1419 12/25/18 1536 01/06/19 1228 01/20/19 1221       Exercise Goal Re-Evaluation   Exercise Goals Review  Increase Physical Activity;Increase Strength and Stamina;Understanding of Exercise Prescription  Increase Physical Activity;Increase Strength and Stamina;Understanding of Exercise Prescription  Increase Physical Activity;Increase Strength and Stamina;Understanding of Exercise Prescription  Increase Physical Activity;Increase Strength and Stamina;Understanding of Exercise Prescription    Comments  Louana is doing well in rehab.  She is exercising two extra days at home.  She usually  walks and does stretches to stay active.  Her stamina continues to improve.  However, her grandkids continue to make her tired.  She would like to join the [place] when she graduates.  Saraih Lorton continues to do well in rehab.  She is nearing graduation and will be doing her post 6MWT soon.  We expect to see some improvement.  We will continue to monitor her progress.  Oveda is set to graduate on Thursday!!  She improved her walk test by 87 ft!!  She is planning to join the [facility] after graduation once they open to new members.  She has really enjoyed getting back into a routine.    Expected Outcomes  Short: Continue to exercise on days off.  Long: Continue to improve stamina.  -  Short: Improve post 6MWT.  Long: Continue to exercise independently  Short: Graduate!!  Long: Continue to exercise independently!!       Nutrition & Weight - Outcomes:  Post Biometrics - 01/15/19 1416       Post  Biometrics   Height  5' 1.5" (1.562 m)    Weight  160 lb 12.8 oz (72.9 kg)    BMI (Calculated)  29.89    Single Leg Stand  15.12 seconds       Nutrition: Nutrition Therapy & Goals - 11/19/18 1524      Nutrition Therapy   Diet  Low Sodium, HH diet    Protein (specify units)  60-65g    Fiber  25 grams    Whole Grain Foods  3 servings    Saturated Fats  12 max. grams    Fruits and Vegetables  5 servings/day    Sodium  1.5 grams      Personal Nutrition Goals   Nutrition Goal  ST: continue with HH eating, lose about 2 lbs LT: lose 10-15lbs    Comments  Discussed HH eating and [employer]. Pt eats low fat yogurt with peaches 2 cups of coffee (sometimes black and sometimes with half and half), salad with chicken, mixes it up for dinner chicken with olive oil baked, salsa, and [place] cheese, or chili with beans and veggies. Pt will eat whole grains, lean protein, beans, fruits and vegetables. Pt does not fear going out to eat and will try to make healthy modifications.      Intervention Plan   Intervention   Prescribe, educate and counsel regarding individualized specific dietary modifications aiming towards targeted core components such as weight, hypertension, lipid management, diabetes, heart failure and other comorbidities.    Expected Outcomes  Short Term Goal: Understand basic principles of dietary content, such as calories, fat, sodium, cholesterol and nutrients.;Short Term Goal: A plan has been developed with personal nutrition goals set during dietitian appointment.;Long Term Goal: Adherence to prescribed nutrition plan.       Nutrition Discharge:   Education Questionnaire Score: Knowledge Questionnaire Score - 01/15/19 1532      Knowledge Questionnaire Score   Post Score  24/26       Goals reviewed with patient; copy given to patient.

## 2019-01-22 NOTE — Progress Notes (Signed)
Daily Session Note  Patient Details  Name: Monica Bush MRN: [id number] Date of Birth: 1940/06/17 Referring Provider:     Cardiac Rehab from 05/29/2018 in [facility] Cardiac and Pulmonary Rehab  Referring Provider  kelsey      Encounter Date: 01/22/2019  Check In: Session Check In - 01/22/19 1404      Check-In   Supervising physician immediately available to respond to emergencies  See telemetry face sheet for immediately available ER MD    Location  [facility]-Cardiac & Pulmonary Rehab    Staff Present  Renita Papa, RN BSN;Jessica Luan Pulling, MA, RCEP, CCRP, CCET    Virtual Visit  No    Medication changes reported      No    Fall or balance concerns reported     No    Warm-up and Cool-down  Performed on first and last piece of equipment    Resistance Training Performed  Yes    VAD Patient?  No    PAD/SET Patient?  No      Pain Assessment   Currently in Pain?  No/denies          Social History   Tobacco Use  Smoking Status Never Smoker  Smokeless Tobacco Never Used    Goals Met:  Independence with exercise equipment Exercise tolerated well No report of cardiac concerns or symptoms Strength training completed today  Goals Unmet:  Not Applicable  Comments:  Monica Bush graduated today from  rehab with 36 sessions completed.  Details of the patient's exercise prescription and what She needs to do in order to continue the prescription and progress were discussed with patient.  Patient was given a copy of prescription and goals.  Patient verbalized understanding.  Monica Bush plans to continue to exercise by coming to the [facility].    Dr. Emily Filbert is Medical Director for [facility].

## 2019-01-22 NOTE — Progress Notes (Signed)
Cardiac Individual Treatment Plan  Patient Details  Name: Calea Hribar MRN: [id number] Date of Birth: 1941-04-08 Referring Provider:     Cardiac Rehab from 05/29/2018 in [facility] Cardiac and Pulmonary Rehab  Referring Provider  kelsey      Initial Encounter Date:    Cardiac Rehab from 05/29/2018 in [facility] Cardiac and Pulmonary Rehab  Date  05/29/18      Visit Diagnosis: S/P PTCA (percutaneous transluminal coronary angioplasty)  Status post coronary artery stent placement  Patient's Home Medications on Admission:  Current Outpatient Medications:  .  amLODipine (NORVASC) 10 MG tablet, Take 10 mg by mouth daily., Disp: , Rfl:  .  aspirin 81 MG chewable tablet, Chew 81 mg by mouth daily., Disp: , Rfl:  .  atorvastatin (LIPITOR) 40 MG tablet, Take 40 mg by mouth daily., Disp: , Rfl:  .  BRILINTA 90 MG TABS tablet, , Disp: , Rfl:  .  chlorpheniramine (CHLOR-TRIMETON) 4 MG tablet, Take by mouth., Disp: , Rfl:  .  Cholecalciferol (VITAMIN D3) 25 MCG (1000 UT) CAPS, Take by mouth., Disp: , Rfl:  .  fluticasone (FLONASE) 50 MCG/ACT nasal spray, Place into the nose., Disp: , Rfl:  .  nitroGLYCERIN (NITROSTAT) 0.4 MG SL tablet, , Disp: , Rfl:   Past Medical History: Past Medical History:  Diagnosis Date  . Anxiety   . Asthma   . CAD (coronary artery disease)   . Hyperlipemia   . Hypertension   . Osteopetrosis   . Reflux esophagitis     Tobacco Use: Social History   Tobacco Use  Smoking Status Never Smoker  Smokeless Tobacco Never Used    Labs: Recent Review Flowsheet Data    There is no flowsheet data to display.       Exercise Target Goals: Exercise Program Goal: Individual exercise prescription set using results from initial 6 min walk test and THRR while considering  patient's activity barriers and safety.   Exercise Prescription Goal: Initial exercise prescription builds to 30-45 minutes a day of aerobic activity, 2-3 days per week.  Home exercise guidelines will be  given to patient during program as part of exercise prescription that the participant will acknowledge.  Activity Barriers & Risk Stratification:   6 Minute Walk: 6 Minute Walk    Row Name 01/15/19 1414         6 Minute Walk   Phase  Discharge     Distance  1315 feet     Distance % Change  7.08 %     Distance Feet Change  87 ft     Walk Time  6 minutes     # of Rest Breaks  0     MPH  2.49     METS  2.44     RPE  11     VO2 Peak  8.55     Symptoms  Yes (comment)     Comments  hips hurt at end     Resting HR  70 bpm     Resting BP  126/64     Max Ex. HR  110 bpm     Max Ex. BP  128/70        Oxygen Initial Assessment:   Oxygen Re-Evaluation:   Oxygen Discharge (Final Oxygen Re-Evaluation):   Initial Exercise Prescription:   Perform Capillary Blood Glucose checks as needed.  Exercise Prescription Changes: Exercise Prescription Changes    Row Name 11/26/18 1500 12/08/18 1200 12/25/18 1500 01/06/19 1200 01/20/19 1200  Response to Exercise   Blood Pressure (Admit)  132/70  116/60  132/64  126/70  124/64   Blood Pressure (Exercise)  126/64  160/58  148/76  142/70  128/70   Blood Pressure (Exit)  124/80  104/62  118/64  124/70  116/66   Heart Rate (Admit)  70 bpm  86 bpm  95 bpm  79 bpm  80 bpm   Heart Rate (Exercise)  117 bpm  118 bpm  123 bpm  112 bpm  109 bpm   Heart Rate (Exit)  89 bpm  85 bpm  94 bpm  83 bpm  79 bpm   Rating of Perceived Exertion (Exercise)  _0 Symptoms  none  none  dizzy after exercise Monday  none  none   Duration  Continue with 30 min of aerobic exercise without signs/symptoms of physical distress.  Continue with 30 min of aerobic exercise without signs/symptoms of physical distress.  Continue with 30 min of aerobic exercise without signs/symptoms of physical distress.  Continue with 30 min of aerobic exercise without signs/symptoms of physical distress.  Continue with 30 min of aerobic exercise without signs/symptoms of  physical distress.   Intensity  THRR unchanged  THRR unchanged  THRR unchanged  THRR unchanged  THRR unchanged     Progression   Progression  Continue to progress workloads to maintain intensity without signs/symptoms of physical distress.  Continue to progress workloads to maintain intensity without signs/symptoms of physical distress.  Continue to progress workloads to maintain intensity without signs/symptoms of physical distress.  Continue to progress workloads to maintain intensity without signs/symptoms of physical distress.  Continue to progress workloads to maintain intensity without signs/symptoms of physical distress.   Average METs  2.92  2.95  2.71  2.91  3.19     Resistance Training   Training Prescription  Yes  Yes  Yes  Yes  Yes   Weight  3 lbs  3 lbs  3 lbs  3 lbs  3 lbs   Reps  10-15  10-15  10-15  10-15  10-15     Interval Training   Interval Training  No  No  No  No  No     Treadmill   MPH  2.3  2.3  2.4  2.4  2.3   Grade  1  1  0  0  1   Minutes  _1 METs  3.08  3.08  2.84  2.84  3.08     Recumbant Bike   Level  _2 2.4  3.1   Watts  33  33  -  -  -   Minutes  _3 METs  3.47  3.47  -  3.2  3.8     NuStep   Level  _4 Minutes  _5 METs  2.6  2.6  2.8  2.7  2.7     Home Exercise Plan   Plans to continue exercise at  [employer] (comment) walking and [employer] (medical) (comment) walking and [employer]medical) (comment) walking and [employer]medical) (comment) walking and [employer] (comment) walking and [employer]  Frequency  Add 2 additional days to program exercise sessions.  Add 2 additional days to program exercise sessions.  Add 2 additional days to program exercise sessions.  Add 2 additional days to program exercise sessions.  Add 2 additional days to program exercise sessions.   Initial Home Exercises  Provided  06/19/18  06/19/18  06/19/18  06/19/18  06/19/18      Exercise Comments: Exercise Comments    Row Name 12/24/18 1543           Exercise Comments  Today Izora Gala had some SOB and diaphoresis while on the Rec Bike,her second piece of equipment today..   She slowed down her workload and symptoms improved.  She then stated"I just don't feel right."  BP, HR, Blood sugar all checked and in normal range.  Then she told us that she had 5-6 bouts of diarrhea yesterday.  Exercise stopped for the day. Shanitra called someone to come and take her home with them, so she will not be alone.  She was advised to call her MD or go to ED if her feeling of not felling right continues.          Exercise Goals and Review:   Exercise Goals Re-Evaluation : Exercise Goals Re-Evaluation    Row Name 08/11/18 1135 09/22/18 1305 10/14/18 1212 11/26/18 1509 12/08/18 1215     Exercise Goal Re-Evaluation   Exercise Goals Review  Increase Physical Activity;Increase Strength and Stamina;Understanding of Exercise Prescription  Increase Physical Activity;Increase Strength and Stamina;Understanding of Exercise Prescription  Increase Physical Activity;Increase Strength and Stamina;Understanding of Exercise Prescription  Increase Physical Activity;Increase Strength and Stamina;Understanding of Exercise Prescription  Increase Physical Activity;Increase Strength and Stamina;Understanding of Exercise Prescription   Comments  Karlye is doing well at home.  She is exercsing everyday. She goes for a walk with a friend from church, maintaining distance, to get out of the house.  She is also using our videos each day.  She has pushed her dining room table out of the way and using weights as well.   We will continue to monitor her progress  Cesiah has been doing her walking still, but it is starting to hurt her hip. She has been heating and icing her hip; we also talked about some options to help stretch that area.  She is also using our  videos and enjoys watching them.  Seh continues to get in her weights as well.   Vianney is doing well.  She is feeling better, the hip stretches really made a difference and she is back to walking again!!  Carianne has been doing well in rehab.  She has missed this week with illness.  She has been able to return to rehab at her workloads from before.  We will continue to monitor her progress.  Velmer is doing well in rehab.  She is still recovering from her illness. She continues to rebuild her stamina.  We will continue to monitor her progress.   Expected Outcomes  Short: Continue to walk and use videos each day.  Long: Continue to stay active at home.   Short: Stretch out hip more.  Long: Continue to stay active using videos.   Short: Continue to stretch hip.  Long: Continue to walk and use videos  Short: Return to rehab regularly.  Long: Continue to walk on off day  Short: Return to rehab regulalry again. Long: Continue to increase stamina.   [place] Name 12/22/18 1419 12/25/18 1536 01/06/19 1228 01/20/19 1221  Exercise Goal Re-Evaluation   Exercise Goals Review  Increase Physical Activity;Increase Strength and Stamina;Understanding of Exercise Prescription  Increase Physical Activity;Increase Strength and Stamina;Understanding of Exercise Prescription  Increase Physical Activity;Increase Strength and Stamina;Understanding of Exercise Prescription  Increase Physical Activity;Increase Strength and Stamina;Understanding of Exercise Prescription    Comments  Katelynn is doing well in rehab.  She is exercising two extra days at home.  She usually walks and does stretches to stay active.  Her stamina continues to improve.  However, her grandkids continue to make her tired.  She would like to join the [employer] when she graduates.  Kaylen Motl continues to do well in rehab.  She is nearing graduation and will be doing her post 6MWT soon.  We expect to see some improvement.  We will continue to monitor her progress.  Aamani  is set to graduate on Thursday!!  She improved her walk test by 87 ft!!  She is planning to join the [employer] after graduation once they open to new members.  She has really enjoyed getting back into a routine.    Expected Outcomes  Short: Continue to exercise on days off.  Long: Continue to improve stamina.  -  Short: Improve post 6MWT.  Long: Continue to exercise independently  Short: Graduate!!  Long: Continue to exercise independently!!       Discharge Exercise Prescription (Final Exercise Prescription Changes): Exercise Prescription Changes - 01/20/19 1200      Response to Exercise   Blood Pressure (Admit)  124/64    Blood Pressure (Exercise)  128/70    Blood Pressure (Exit)  116/66    Heart Rate (Admit)  80 bpm    Heart Rate (Exercise)  109 bpm    Heart Rate (Exit)  79 bpm    Rating of Perceived Exertion (Exercise)  12    Symptoms  none    Duration  Continue with 30 min of aerobic exercise without signs/symptoms of physical distress.    Intensity  THRR unchanged      Progression   Progression  Continue to progress workloads to maintain intensity without signs/symptoms of physical distress.    Average METs  3.19      Resistance Training   Training Prescription  Yes    Weight  3 lbs    Reps  10-15      Interval Training   Interval Training  No      Treadmill   MPH  2.3    Grade  1    Minutes  15    METs  3.08      Recumbant Bike   Level  3.1    Minutes  15    METs  3.8      NuStep   Level  4    Minutes  15    METs  2.7      Home Exercise Plan   Plans to continue exercise at  [employer] (comment)   walking and [employer]   Frequency  Add 2 additional days to program exercise sessions.    Initial Home Exercises Provided  06/19/18       Nutrition:  Target Goals: Understanding of nutrition guidelines, daily intake of sodium <1538m, cholesterol <2030m calories 30% from fat and 7% or less from saturated fats, daily to have 5 or more servings of  fruits and vegetables.  Biometrics:  PoBarbie Haggis 01/15/19 1416       Post  Biometrics   Height  5'  1.5" (1.562 m)    Weight  160 lb 12.8 oz (72.9 kg)    BMI (Calculated)  29.89    Single Leg Stand  15.12 seconds       Nutrition Therapy Plan and Nutrition Goals: Nutrition Therapy & Goals - 11/19/18 1524      Nutrition Therapy   Diet  Low Sodium, HH diet    Protein (specify units)  60-65g    Fiber  25 grams    Whole Grain Foods  3 servings    Saturated Fats  12 max. grams    Fruits and Vegetables  5 servings/day    Sodium  1.5 grams      Personal Nutrition Goals   Nutrition Goal  ST: continue with HH eating, lose about 2 lbs LT: lose 10-15lbs    Comments  Discussed HH eating and My[employer]. Pt eats low fat yogurt with peaches 2 cups of coffee (sometimes black and sometimes with half and half), salad with chicken, mixes it up for dinner chicken with olive oil baked, salsa, and [place] cheese, or chili with beans and veggies. Pt will eat whole grains, lean protein, beans, fruits and vegetables. Pt does not fear going out to eat and will try to make healthy modifications.      Intervention Plan   Intervention  Prescribe, educate and counsel regarding individualized specific dietary modifications aiming towards targeted core components such as weight, hypertension, lipid management, diabetes, heart failure and other comorbidities.    Expected Outcomes  Short Term Goal: Understand basic principles of dietary content, such as calories, fat, sodium, cholesterol and nutrients.;Short Term Goal: A plan has been developed with personal nutrition goals set during dietitian appointment.;Long Term Goal: Adherence to prescribed nutrition plan.       Nutrition Assessments:   Nutrition Goals Re-Evaluation: Nutrition Goals Re-Evaluation    [place] Name 12/22/18 1442 01/21/19 1640           Goals   Nutrition Goal  ST: continue with HH eating, lose about 2 lbs LT: lose 10-15lbs  ST/LT:  continue with [place] eating, lose about 2 lbs      Comment  Hooria is eating a heart healthy diet. She makes a spinach omelet for breakfast. She is trying to stop using creamer in her coffee; now only uses it with cheap coffee that doesn't taste good black. Went on vk last week and ate what her family made. Pt reports she is doing well and experimenting with new recipes (most recently stuffed zucchini) to get a variety of foods and keep it interesting.  Pt reports doing well and doesn't need anything from this RD before discharge      Expected Outcome  Short: Continue healthy eating and making small changes. Long: Continue cooking small servings since she lives alone.  Short: Continue healthy eating and making small changes. Long: Continue cooking small servings since she lives alone.         Nutrition Goals Discharge (Final Nutrition Goals Re-Evaluation): Nutrition Goals Re-Evaluation - 01/21/19 1640      Goals   Nutrition Goal  ST/LT: continue with HH eating, lose about 2 lbs    Comment  Pt reports doing well and doesn't need anything from this RD before discharge    Expected Outcome  Short: Continue healthy eating and making small changes. Long: Continue cooking small servings since she lives alone.       Psychosocial: Target Goals: Acknowledge presence or absence of significant depression and/or stress, maximize coping  skills, provide positive support system. Participant is able to verbalize types and ability to use techniques and skills needed for reducing stress and depression.   Initial Review & Psychosocial Screening:   Quality of Life Scores:  Quality of Life - 01/15/19 1532      Quality of Life   Select  Quality of Life      Quality of Life Scores   Health/Function Post  28.4 %    Socioeconomic Post  29.64 %    Psych/Spiritual Post  30 %    Family Post  27.5 %    GLOBAL Post  28.85 %      Scores of 19 and below usually indicate a poorer quality of life in these areas.  A  difference of  2-3 points is a clinically meaningful difference.  A difference of 2-3 points in the total score of the Quality of Life Index has been associated with significant improvement in overall quality of life, self-image, physical symptoms, and general health in studies assessing change in quality of life.  PHQ-9: Recent Review Flowsheet Data    Depression screen [facility] 2/9 01/15/2019 05/29/2018   Decreased Interest 0 0   Down, Depressed, Hopeless 0 0   PHQ - 2 Score 0 0   Altered sleeping 1 0   Tired, decreased energy 1 1   Change in appetite 0 1   Feeling bad or failure about yourself  0 1   Trouble concentrating 0 0   Moving slowly or fidgety/restless 0 0   Suicidal thoughts 0 0   PHQ-9 Score 2 3   Difficult doing work/chores Not difficult at all Somewhat difficult     Interpretation of Total Score  Total Score Depression Severity:  1-4 = Minimal depression, 5-9 = Mild depression, 10-14 = Moderate depression, 15-19 = Moderately severe depression, 20-27 = Severe depression   Psychosocial Evaluation and Intervention: Psychosocial Evaluation - 01/19/19 1417      Discharge Psychosocial Assessment & Intervention   Comments  Mickelle has really enjoyed the program.  She is graudating on Friday with plans to join the [employer].  She has really enjoyed the staff and other patients.  She has also gotten a lot of the routine of exercise and getting in here regulalry.  She is going to miss joking around with the staff the most.       Psychosocial Re-Evaluation: Psychosocial Re-Evaluation    [place] Name 08/11/18 1140 09/22/18 1307 10/14/18 1214 12/22/18 1425       Psychosocial Re-Evaluation   Current issues with  Current Stress Concerns  Current Stress Concerns  Current Stress Concerns  Current Stress Concerns    Comments  Seleste has continued to do well at home.  She is getting out to exercise and staying positvie.  She has enjoyed the calls and videos to stay connected.    She is limiting  her outings to get the essentials and lower her exposure risk.  She has found that [employer] chocolate chips helps her get her chocolate fix at less caloreies.   Revecca has been doing well at home.  She is currently out on a hunt for more toilet paper.  She said that [employer] out in 30 minutes yesterday! She is still limiting her outings for essentials.  She is also still stress eating.  After we talked last time, she got very sick for a couple of weeks and was even tested for COVID-19 but was negative.  She is now  feeling better.   Yoseline has been doing good.  She has been working out in her yard and walking again.  She is back to walking with her neighbor and checking on them.   Secret is doing better with her stress, relationships are improving and finances are improving and she is happier.  She is walking outside and gets out in the yard to help too.  She continues to sleep well since she cut out the chocolate at night.    Expected Outcomes  Short: Continue to get exercise  Long: Continue to stay postive.   Short: Continue to recover.  Long: Continue to stay postive.   Short: Continue to connect with neighbors.  Long: Continue to stay positive.   Short: Continue to get out side for stress relief.  Long: Continue to stay positive.    Interventions  Encouraged to attend Cardiac Rehabilitation for the exercise  Encouraged to attend Cardiac Rehabilitation for the exercise  -  Encouraged to attend Cardiac Rehabilitation for the exercise    Continue Psychosocial Services   Follow up required by staff  Follow up required by staff  -  Follow up required by staff       Psychosocial Discharge (Final Psychosocial Re-Evaluation): Psychosocial Re-Evaluation - 12/22/18 1425      Psychosocial Re-Evaluation   Current issues with  Current Stress Concerns    Comments  Danya is doing better with her stress, relationships are improving and finances are improving and she is happier.  She is walking outside and gets  out in the yard to help too.  She continues to sleep well since she cut out the chocolate at night.    Expected Outcomes  Short: Continue to get out side for stress relief.  Long: Continue to stay positive.    Interventions  Encouraged to attend Cardiac Rehabilitation for the exercise    Continue Psychosocial Services   Follow up required by staff       Vocational Rehabilitation: Provide vocational rehab assistance to qualifying candidates.   Vocational Rehab Evaluation & Intervention:   Education: Education Goals: Education classes will be provided on a variety of topics geared toward better understanding of heart health and risk factor modification. Participant will state understanding/return demonstration of topics presented as noted by education test scores.  Learning Barriers/Preferences:   Education Topics:  AED/CPR: - Group verbal and written instruction with the use of models to demonstrate the basic use of the AED with the basic ABC's of resuscitation.   General Nutrition Guidelines/Fats and Fiber: -Group instruction provided by verbal, written material, models and posters to present the general guidelines for heart healthy nutrition. Gives an explanation and review of dietary fats and fiber.   Controlling Sodium/Reading Food Labels: -Group verbal and written material supporting the discussion of sodium use in heart healthy nutrition. Review and explanation with models, verbal and written materials for utilization of the food label.   Exercise Physiology & General Exercise Guidelines: - Group verbal and written instruction with models to review the exercise physiology of the cardiovascular system and associated critical values. Provides general exercise guidelines with specific guidelines to those with heart or lung disease.    Aerobic Exercise & Resistance Training: - Gives group verbal and written instruction on the various components of exercise. Focuses on aerobic  and resistive training programs and the benefits of this training and how to safely progress through these programs..   Flexibility, Balance, Mind/Body Relaxation: Provides group verbal/written instruction on the benefits  of flexibility and balance training, including mind/body exercise modes such as yoga, pilates and tai chi.  Demonstration and skill practice provided.   Stress and Anxiety: - Provides group verbal and written instruction about the health risks of elevated stress and causes of high stress.  Discuss the correlation between heart/lung disease and anxiety and treatment options. Review healthy ways to manage with stress and anxiety.   Cardiac Rehab from 07/10/2018 in [facility] Cardiac and Pulmonary Rehab  Date  06/24/18  Educator  [facility]  Instruction Review Code  1- Verbalizes Understanding      Depression: - Provides group verbal and written instruction on the correlation between heart/lung disease and depressed mood, treatment options, and the stigmas associated with seeking treatment.   Cardiac Rehab from 07/10/2018 in [facility] Cardiac and Pulmonary Rehab  Date  07/08/18  Educator  [facility]  Instruction Review Code  1- Verbalizes Understanding      Anatomy & Physiology of the Heart: - Group verbal and written instruction and models provide basic cardiac anatomy and physiology, with the coronary electrical and arterial systems. Review of Valvular disease and Heart Failure   Cardiac Rehab from 07/10/2018 in [facility] Cardiac and Pulmonary Rehab  Date  06/12/18  Educator  CE  Instruction Review Code  1- Verbalizes Understanding      Cardiac Procedures: - Group verbal and written instruction to review commonly prescribed medications for heart disease. Reviews the medication, class of the drug, and side effects. Includes the steps to properly store meds and maintain the prescription regimen. (beta blockers and nitrates)   Cardiac Rehab from 07/10/2018 in [facility] Cardiac and Pulmonary Rehab  Date   06/26/18  Educator  CE  Instruction Review Code  1- Verbalizes Understanding      Cardiac Medications I: - Group verbal and written instruction to review commonly prescribed medications for heart disease. Reviews the medication, class of the drug, and side effects. Includes the steps to properly store meds and maintain the prescription regimen.   Cardiac Medications II: -Group verbal and written instruction to review commonly prescribed medications for heart disease. Reviews the medication, class of the drug, and side effects. (all other drug classes)   Cardiac Rehab from 07/10/2018 in [facility] Cardiac and Pulmonary Rehab  Date  07/10/18  Educator  CE  Instruction Review Code  1- Verbalizes Understanding       Go Sex-Intimacy & Heart Disease, Get SMART - Goal Setting: - Group verbal and written instruction through game format to discuss heart disease and the return to sexual intimacy. Provides group verbal and written material to discuss and apply goal setting through the application of the S.M.A.R.T. Method.   Cardiac Rehab from 07/10/2018 in [facility]  Date  06/26/18  Educator  CE  Instruction Review Code  1- Verbalizes Understanding      Other Matters of the Heart: - Provides group verbal, written materials and models to describe Stable Angina and Peripheral Artery. Includes description of the disease process and treatment options available to the cardiac patient.   Exercise & Equipment Safety: - Individual verbal instruction and demonstration of equipment use and safety with use of the equipment.   Cardiac Rehab from 07/10/2018 in [facility]  Date  05/29/18  Educator  [facility]  Instruction Review Code  1- Verbalizes Understanding      Infection Prevention: - Provides verbal and written material to individual with discussion of infection control including proper hand washing and proper equipment cleaning  during exercise session.   Cardiac  Rehab from 07/10/2018 in [facility]  Date  05/29/18  Educator  [facility]  Instruction Review Code  1- Verbalizes Understanding      Falls Prevention: - Provides verbal and written material to individual with discussion of falls prevention and safety.   Cardiac Rehab from 07/10/2018 in [facility]  Date  05/29/18  Educator  [facility]  Instruction Review Code  1- Verbalizes Understanding      Diabetes: - Individual verbal and written instruction to review signs/symptoms of diabetes, desired ranges of glucose level fasting, after meals and with exercise. Acknowledge that pre and post exercise glucose checks will be done for 3 sessions at entry of program.   Know Your Numbers and Risk Factors: -Group verbal and written instruction about important numbers in your health.  Discussion of what are risk factors and how they play a role in the disease process.  Review of Cholesterol, Blood Pressure, Diabetes, and BMI and the role they play in your overall health.   Cardiac Rehab from 07/10/2018 in [facility] Re[facility]  Date  07/10/18  Educator  CE  Instruction Review Code  1- Verbalizes Understanding      Sleep Hygiene: -Provides group verbal and written instruction about how sleep can affect your health.  Define sleep hygiene, discuss sleep cycles and impact of sleep habits. Review good sleep hygiene tips.    Cardiac Rehab from 07/10/2018 in [facility]  Date  06/10/18  Educator  [facility]  Instruction Review Code  1- Verbalizes Understanding      Other: -Provides group and verbal instruction on various topics (see comments)   Knowledge Questionnaire Score: Knowledge Questionnaire Score - 01/15/19 1532      Knowledge Questionnaire Score   Post Score  24/26       Core Components/Risk Factors/Patient Goals at Admission:   Core Components/Risk Factors/Patient Goals Review:  Goals and Risk Factor Review    Row Name 08/11/18 1143  09/22/18 1311 10/14/18 1215 12/22/18 1427       Core Components/Risk Factors/Patient Goals Review   Personal Goals Review  Weight Management/Obesity;Lipids;Hypertension  Weight Management/Obesity;Lipids;Hypertension  Weight Management/Obesity;Lipids;Hypertension  Weight Management/Obesity;Lipids;Hypertension    Review  Imberly has been doing well at home.  She said that her weight is up some due to stress eating and from being home.  She has tried to find some better snack options and went to store after talking with Melissa to get some new healthier things to try.  Other than her weight, her number at home have been doing well.    Elesha has been doing well.  Her weight continues to be up some due to stress eating (now on [place] [employer]).  She is trying to be good otherwise though.  Her numbers have been pretty good.    Libi is doing well.  She is motivated to lose weight and has restarted her diet!! Her pressures have continued to do well.   Tyyne is doing well.  She gained weight while on vacation but it is already starting to go back down again.  Her pressures continue to do well at home and in class.  She is doing well on her meds.  She is ready to get off her Birlinta due to the bruising.    Expected Outcomes  Short: Continue to check pressures.  Long: Continue to work on [employer]  Short: Continue to work on stress  eating.  Long: Continue to monitor risk factors.   Short: Lose weight! Long: Continue to monitor risk factors.   Short: Continue to work on weight loss.  Long: Continue to monitor risk factors.       Core Components/Risk Factors/Patient Goals at Discharge (Final Review):  Goals and Risk Factor Review - 12/22/18 1427      Core Components/Risk Factors/Patient Goals Review   Personal Goals Review  Weight Management/Obesity;Lipids;Hypertension    Review  Sammantha is doing well.  She gained weight while on vacation but it is already starting to go back down again.  Her pressures continue to do  well at home and in class.  She is doing well on her meds.  She is ready to get off her Birlinta due to the bruising.    Expected Outcomes  Short: Continue to work on weight loss.  Long: Continue to monitor risk factors.       ITP Comments: ITP Comments    Row Name 07/30/18 1219 09/01/18 1110 11/19/18 1417 12/17/18 0633 12/24/18 1543   ITP Comments   30 day review. Continue with ITP unless directed changes by Medical Director chart review.  Called to check on patient.  Left message, sent email.   30 day review cycle restarting  after being closed since March 16 because of  Covid 19 pandemic. Program opened to patients on July 6. Not all have returned. ITP updated and sent to Medical Director for review,changes as needed and signature  30 Day Review Completed today. Continue with ITP unless changed by Medical Director review.  Today Izora Gala had some SOB and diaphoresis while on the Rec Bike,her second piece of equipment today..   She slowed down her workload and symptoms improved.  She then stated"I just don't feel right."  BP, HR, Blood sugar all checked and in normal range.  Then she told us that she had 5-6 bouts of diarrhea yesterday.  Exercise stopped for the day. Raina called someone to come and take her home with them, so she will not be alone.  She was advised to call her MD or go to ED if her feeling of not felling right continues.   [place] Name 01/14/19 0647 01/22/19 1405         ITP Comments  30 Day review. Continue with ITP unless directed changes per Medical Director review.  Malissie graduated today from  rehab with 36 sessions completed.  Details of the patient's exercise prescription and what She needs to do in order to continue the prescription and progress were discussed with patient.  Patient was given a copy of prescription and goals.  Patient verbalized understanding.  Genecis plans to continue to exercise by coming to the [facility].         Comments: Discharge ITP

## 2019-03-13 ENCOUNTER — Other Ambulatory Visit: Payer: Self-pay | Admitting: Family Medicine

## 2019-03-13 DIAGNOSIS — R42 Dizziness and giddiness: Secondary | ICD-10-CM

## 2019-03-13 DIAGNOSIS — G44309 Post-traumatic headache, unspecified, not intractable: Secondary | ICD-10-CM

## 2019-03-24 ENCOUNTER — Other Ambulatory Visit: Payer: Self-pay

## 2019-03-24 ENCOUNTER — Ambulatory Visit
Admission: RE | Admit: 2019-03-24 | Discharge: 2019-03-24 | Disposition: A | Discharge status: Home / Self Care | Payer: Medicare Other | Source: Ambulatory Visit | Attending: Family Medicine | Admitting: Family Medicine

## 2019-03-24 DIAGNOSIS — R42 Dizziness and giddiness: Secondary | ICD-10-CM | POA: Insufficient documentation

## 2019-03-24 DIAGNOSIS — G44309 Post-traumatic headache, unspecified, not intractable: Secondary | ICD-10-CM

## 2019-09-16 ENCOUNTER — Other Ambulatory Visit: Payer: Self-pay | Admitting: Family Medicine

## 2019-09-16 DIAGNOSIS — Z780 Asymptomatic menopausal state: Secondary | ICD-10-CM

## 2019-09-16 DIAGNOSIS — Z8739 Personal history of other diseases of the musculoskeletal system and connective tissue: Secondary | ICD-10-CM

## 2019-09-17 ENCOUNTER — Other Ambulatory Visit: Payer: Self-pay | Admitting: Family Medicine

## 2019-09-17 DIAGNOSIS — Z1231 Encounter for screening mammogram for malignant neoplasm of breast: Secondary | ICD-10-CM

## 2019-09-23 ENCOUNTER — Ambulatory Visit
Admission: RE | Admit: 2019-09-23 | Discharge: 2019-09-23 | Disposition: A | Discharge status: Home / Self Care | Payer: Medicare Other | Source: Ambulatory Visit | Attending: Family Medicine | Admitting: Family Medicine

## 2019-09-23 DIAGNOSIS — Z1231 Encounter for screening mammogram for malignant neoplasm of breast: Secondary | ICD-10-CM | POA: Diagnosis present

## 2019-09-23 DIAGNOSIS — Z8739 Personal history of other diseases of the musculoskeletal system and connective tissue: Secondary | ICD-10-CM | POA: Diagnosis not present

## 2019-09-23 DIAGNOSIS — Z780 Asymptomatic menopausal state: Secondary | ICD-10-CM

## 2020-02-10 ENCOUNTER — Emergency Department: Payer: Medicare Other

## 2020-02-10 ENCOUNTER — Other Ambulatory Visit: Payer: Self-pay

## 2020-02-10 ENCOUNTER — Emergency Department
Admission: EM | Admit: 2020-02-10 | Discharge: 2020-02-10 | Disposition: A | Discharge status: Home / Self Care | Payer: Medicare Other | Attending: Emergency Medicine | Admitting: Emergency Medicine

## 2020-02-10 DIAGNOSIS — R0789 Other chest pain: Secondary | ICD-10-CM | POA: Diagnosis not present

## 2020-02-10 DIAGNOSIS — R0602 Shortness of breath: Secondary | ICD-10-CM | POA: Insufficient documentation

## 2020-02-10 DIAGNOSIS — R059 Cough, unspecified: Secondary | ICD-10-CM | POA: Insufficient documentation

## 2020-02-10 DIAGNOSIS — Z5321 Procedure and treatment not carried out due to patient leaving prior to being seen by health care provider: Secondary | ICD-10-CM | POA: Insufficient documentation

## 2020-02-10 LAB — CBC
HCT: 40.7 % (ref 36.0–46.0)
Hemoglobin: 14.1 g/dL (ref 12.0–15.0)
MCH: 30.6 pg (ref 26.0–34.0)
MCHC: 34.6 g/dL (ref 30.0–36.0)
MCV: 88.3 fL (ref 80.0–100.0)
Platelets: 250 10*3/uL (ref 150–400)
RBC: 4.61 MIL/uL (ref 3.87–5.11)
RDW: 13.7 % (ref 11.5–15.5)
WBC: 6.5 10*3/uL (ref 4.0–10.5)
nRBC: 0 % (ref 0.0–0.2)

## 2020-02-10 LAB — BASIC METABOLIC PANEL
Anion gap: 6 (ref 5–15)
BUN: 13 mg/dL (ref 8–23)
CO2: 28 mmol/L (ref 22–32)
Calcium: 9.5 mg/dL (ref 8.9–10.3)
Chloride: 104 mmol/L (ref 98–111)
Creatinine, Ser: 0.75 mg/dL (ref 0.44–1.00)
GFR calc non Af Amer: >60 mL/min (ref >60)
Glucose, Bld: 123 mg/dL — ABNORMAL HIGH (ref 70–99)
Potassium: 3.7 mmol/L (ref 3.5–5.1)
Sodium: 138 mmol/L (ref 135–145)

## 2020-02-10 LAB — TROPONIN I (HIGH SENSITIVITY): Troponin I (High Sensitivity): 4 ng/L (ref <18)

## 2020-02-10 NOTE — ED Triage Notes (Signed)
Pt here with SOB and chest pressure that started a few days ago. Pt states that she was walking in the grocery store and became very SOB. Pt NAD in triage.

## 2020-07-07 ENCOUNTER — Encounter: Payer: Medicare Other | Attending: Cardiology

## 2020-07-07 ENCOUNTER — Other Ambulatory Visit: Payer: Self-pay

## 2020-07-07 DIAGNOSIS — Z955 Presence of coronary angioplasty implant and graft: Secondary | ICD-10-CM | POA: Insufficient documentation

## 2020-07-07 NOTE — Progress Notes (Signed)
Virtual Visit completed. Patient informed on EP and RD appointment and 6 Minute walk test. Patient also informed of patient health questionnaires on My Chart. Patient Verbalizes understanding. Visit diagnosis can be found in [facility] 02/26/2020. 

## 2020-07-14 ENCOUNTER — Other Ambulatory Visit: Payer: Self-pay

## 2020-07-14 ENCOUNTER — Encounter: Payer: Medicare Other | Admitting: *Deleted

## 2020-07-14 VITALS — Ht 61.1 in | Wt 175.4 lb

## 2020-07-14 DIAGNOSIS — Z955 Presence of coronary angioplasty implant and graft: Secondary | ICD-10-CM

## 2020-07-14 NOTE — Progress Notes (Signed)
Cardiac Individual Treatment Plan  Patient Details  Name: Monica Bush MRN: [id number] Date of Birth: 04-25-1941 Referring Provider:   Flowsheet Row Cardiac Rehab from 07/14/2020 in [facility] Cardiac and Pulmonary Rehab  Referring Provider Ciro Backer MD      Initial Encounter Date:  Flowsheet Row Cardiac Rehab from 07/14/2020 in [facility] Cardiac and Pulmonary Rehab  Date 07/14/20      Visit Diagnosis: Status post coronary artery stent placement  Patient's Home Medications on Admission:  Current Outpatient Medications:  .  acetaminophen (TYLENOL) 500 MG tablet, Take by mouth., Disp: , Rfl:  .  amLODipine (NORVASC) 10 MG tablet, Take 10 mg by mouth daily., Disp: , Rfl:  .  amLODipine (NORVASC) 10 MG tablet, Take 1 tablet by mouth daily. (Patient not taking: Reported on 07/07/2020), Disp: , Rfl:  .  ascorbic acid (VITAMIN C) 100 MG tablet, Take by mouth., Disp: , Rfl:  .  aspirin 81 MG chewable tablet, Chew 81 mg by mouth daily., Disp: , Rfl:  .  atorvastatin (LIPITOR) 40 MG tablet, Take 40 mg by mouth daily., Disp: , Rfl:  .  azelastine (ASTELIN) 0.1 % nasal spray, Place into the nose., Disp: , Rfl:  .  BRILINTA 90 MG TABS tablet, , Disp: , Rfl:  .  chlorpheniramine (CHLOR-TRIMETON) 4 MG tablet, Take by mouth., Disp: , Rfl:  .  Cholecalciferol (VITAMIN D3) 25 MCG (1000 UT) CAPS, Take by mouth., Disp: , Rfl:  .  clopidogrel (PLAVIX) 75 MG tablet, Take by mouth., Disp: , Rfl:  .  famotidine (PEPCID) 40 MG tablet, Take by mouth., Disp: , Rfl:  .  fluticasone (FLONASE) 50 MCG/ACT nasal spray, Place into the nose., Disp: , Rfl:  .  Multiple Vitamin (MULTI-VITAMIN) tablet, Take 1 tablet by mouth daily., Disp: , Rfl:  .  nitroGLYCERIN (NITROSTAT) 0.4 MG SL tablet, , Disp: , Rfl:  .  nitroGLYCERIN (NITROSTAT) 0.4 MG SL tablet, Place 1 tablet under the tongue every 5 (five) minutes as needed. (Patient not taking: Reported on 07/07/2020), Disp: , Rfl:  .  nystatin ointment (MYCOSTATIN), Apply topically 2  (two) times daily., Disp: , Rfl:  .  STUDY - ASPIRE - aspirin 81 mg or placebo tablet (PI-Sethi), Take 1 tablet by mouth daily., Disp: , Rfl:   Past Medical History: Past Medical History:  Diagnosis Date  . Anxiety   . Asthma   . CAD (coronary artery disease)   . Hyperlipemia   . Hypertension   . Osteopetrosis   . Reflux esophagitis     Tobacco Use: Social History   Tobacco Use  Smoking Status Never Smoker  Smokeless Tobacco Never Used    Labs: Recent Review Flowsheet Data   There is no flowsheet data to display.      Exercise Target Goals: Exercise Program Goal: Individual exercise prescription set using results from initial 6 min walk test and THRR while considering  patient's activity barriers and safety.   Exercise Prescription Goal: Initial exercise prescription builds to 30-45 minutes a day of aerobic activity, 2-3 days per week.  Home exercise guidelines will be given to patient during program as part of exercise prescription that the participant will acknowledge.   Education: Aerobic Exercise: - Group verbal and visual presentation on the components of exercise prescription. Introduces F.I.T.T principle from ACSM for exercise prescriptions.  Reviews F.I.T.T. principles of aerobic exercise including progression. Written material given at graduation.   Education: Resistance Exercise: - Group verbal and visual presentation on the components  of exercise prescription. Introduces F.I.T.T principle from ACSM for exercise prescriptions  Reviews F.I.T.T. principles of resistance exercise including progression. Written material given at graduation.    Education: Exercise & Equipment Safety: - Individual verbal instruction and demonstration of equipment use and safety with use of the equipment. Flowsheet Row Cardiac Rehab from 07/14/2020 in [facility] Cardiac and Pulmonary Rehab  Date 07/07/20  Educator [facility]  Instruction Review Code 1- Verbalizes Understanding       Education: Exercise Physiology & General Exercise Guidelines: - Group verbal and written instruction with models to review the exercise physiology of the cardiovascular system and associated critical values. Provides general exercise guidelines with specific guidelines to those with heart or lung disease.    Education: Flexibility, Balance, Mind/Body Relaxation: - Group verbal and visual presentation with interactive activity on the components of exercise prescription. Introduces F.I.T.T principle from ACSM for exercise prescriptions. Reviews F.I.T.T. principles of flexibility and balance exercise training including progression. Also discusses the mind body connection.  Reviews various relaxation techniques to help reduce and manage stress (i.e. Deep breathing, progressive muscle relaxation, and visualization). Balance handout provided to take home. Written material given at graduation.   Activity Barriers & Risk Stratification:  Activity Barriers & Cardiac Risk Stratification - 07/14/20 1151      Activity Barriers & Cardiac Risk Stratification   Activity Barriers Deconditioning;Muscular Weakness;Balance Concerns    Cardiac Risk Stratification Moderate           6 Minute Walk:  6 Minute Walk    Row Name 07/14/20 1150         6 Minute Walk   Phase Initial     Distance 1195 feet     Walk Time 6 minutes     # of Rest Breaks 0     MPH 2.26     METS 1.72     RPE 12     Perceived Dyspnea  1     VO2 Peak 6.03     Symptoms Yes (comment)     Comments balance felt off at end     Resting HR 80 bpm     Resting BP 122/80     Resting Oxygen Saturation  96 %     Exercise Oxygen Saturation  during 6 min walk 96 %     Max Ex. HR 100 bpm     Max Ex. BP 126/66     2 Minute Post BP 120/66            Oxygen Initial Assessment:   Oxygen Re-Evaluation:   Oxygen Discharge (Final Oxygen Re-Evaluation):   Initial Exercise Prescription:  Initial Exercise Prescription - 07/14/20  1100      Date of Initial Exercise RX and Referring Provider   Date 07/14/20    Referring Provider Ciro Backer MD      Treadmill   MPH 2    Grade 0.5    Minutes 15    METs 2.75      Recumbant Bike   Level 1    RPM 50    Watts 10    Minutes 15    METs 2      NuStep   Level 1    SPM 80    Minutes 15    METs 2      REL-XR   Level 1    Speed 50    Minutes 15    METs 2      T5 Nustep   Level 1  SPM 80    Minutes 15    METs 2      Prescription Details   Frequency (times per week) 3    Duration Progress to 30 minutes of continuous aerobic without signs/symptoms of physical distress      Intensity   THRR 40-80% of Max Heartrate 105-130    Ratings of Perceived Exertion 11-13    Perceived Dyspnea 0-4      Progression   Progression Continue to progress workloads to maintain intensity without signs/symptoms of physical distress.      Resistance Training   Training Prescription Yes    Weight 3 lb    Reps 10-15           Perform Capillary Blood Glucose checks as needed.  Exercise Prescription Changes:  Exercise Prescription Changes    Row Name 07/14/20 1100             Response to Exercise   Blood Pressure (Admit) 122/80       Blood Pressure (Exercise) 126/66       Blood Pressure (Exit) 120/66       Heart Rate (Admit) 80 bpm       Heart Rate (Exercise) 100 bpm       Heart Rate (Exit) 71 bpm       Oxygen Saturation (Admit) 96 %       Oxygen Saturation (Exercise) 96 %       Rating of Perceived Exertion (Exercise) 12       Perceived Dyspnea (Exercise) 1       Symptoms balance off at end       Comments walk test results              Exercise Comments:   Exercise Goals and Review:  Exercise Goals    Row Name 07/14/20 1153             Exercise Goals   Increase Physical Activity Yes       Intervention Provide advice, education, support and counseling about physical activity/exercise needs.;Develop an individualized exercise  prescription for aerobic and resistive training based on initial evaluation findings, risk stratification, comorbidities and participant's personal goals.       Expected Outcomes Short Term: Attend rehab on a regular basis to increase amount of physical activity.;Long Term: Add in home exercise to make exercise part of routine and to increase amount of physical activity.;Long Term: Exercising regularly at least 3-5 days a week.       Increase Strength and Stamina Yes       Intervention Provide advice, education, support and counseling about physical activity/exercise needs.;Develop an individualized exercise prescription for aerobic and resistive training based on initial evaluation findings, risk stratification, comorbidities and participant's personal goals.       Expected Outcomes Short Term: Increase workloads from initial exercise prescription for resistance, speed, and METs.;Short Term: Perform resistance training exercises routinely during rehab and add in resistance training at home;Long Term: Improve cardiorespiratory fitness, muscular endurance and strength as measured by increased METs and functional capacity ( )       Able to understand and use rate of perceived exertion (RPE) scale Yes       Intervention Provide education and explanation on how to use RPE scale       Expected Outcomes Short Term: Able to use RPE daily in rehab to express subjective intensity level;Long Term:  Able to use RPE to guide intensity level when exercising independently  Able to understand and use Dyspnea scale Yes       Intervention Provide education and explanation on how to use Dyspnea scale       Expected Outcomes Long Term: Able to use Dyspnea scale to guide intensity level when exercising independently;Short Term: Able to use Dyspnea scale daily in rehab to express subjective sense of shortness of breath during exertion       Knowledge and understanding of Target Heart Rate Range (THRR) Yes        Intervention Provide education and explanation of THRR including how the numbers were predicted and where they are located for reference       Expected Outcomes Short Term: Able to use daily as guideline for intensity in rehab;Short Term: Able to state/look up THRR;Long Term: Able to use THRR to govern intensity when exercising independently       Able to check pulse independently Yes       Intervention Provide education and demonstration on how to check pulse in carotid and radial arteries.;Review the importance of being able to check your own pulse for safety during independent exercise       Expected Outcomes Short Term: Able to explain why pulse checking is important during independent exercise;Long Term: Able to check pulse independently and accurately       Understanding of Exercise Prescription Yes       Intervention Provide education, explanation, and written materials on patient's individual exercise prescription       Expected Outcomes Short Term: Able to explain program exercise prescription;Long Term: Able to explain home exercise prescription to exercise independently              Exercise Goals Re-Evaluation :   Discharge Exercise Prescription (Final Exercise Prescription Changes):  Exercise Prescription Changes - 07/14/20 1100      Response to Exercise   Blood Pressure (Admit) 122/80    Blood Pressure (Exercise) 126/66    Blood Pressure (Exit) 120/66    Heart Rate (Admit) 80 bpm    Heart Rate (Exercise) 100 bpm    Heart Rate (Exit) 71 bpm    Oxygen Saturation (Admit) 96 %    Oxygen Saturation (Exercise) 96 %    Rating of Perceived Exertion (Exercise) 12    Perceived Dyspnea (Exercise) 1    Symptoms balance off at end    Comments walk test results           Nutrition:  Target Goals: Understanding of nutrition guidelines, daily intake of sodium 1500mg , cholesterol 200mg , calories 30% from fat and 7% or less from saturated fats, daily to have 5 or more servings of  fruits and vegetables.  Education: All About Nutrition: -Group instruction provided by verbal, written material, interactive activities, discussions, models, and posters to present general guidelines for heart healthy nutrition including fat, fiber, MyPlate, the role of sodium in heart healthy nutrition, utilization of the nutrition label, and utilization of this knowledge for meal planning. Follow up email sent as well. Written material given at graduation. Flowsheet Row [facility]  Education need identified 07/14/20      Biometrics:  Pre Biometrics - 07/14/20 1154      Pre Biometrics   Height 5' 1.1" (1.552 m)    Weight 175 lb 6.4 oz (79.6 kg)    BMI (Calculated) 33.03    Single Leg Stand 8.1 seconds            Nutrition Therapy  Plan and Nutrition Goals:  Nutrition Therapy & Goals - 07/14/20 1157      Intervention Plan   Intervention Prescribe, educate and counsel regarding individualized specific dietary modifications aiming towards targeted core components such as weight, hypertension, lipid management, diabetes, heart failure and other comorbidities.    Expected Outcomes Short Term Goal: Understand basic principles of dietary content, such as calories, fat, sodium, cholesterol and nutrients.;Short Term Goal: A plan has been developed with personal nutrition goals set during dietitian appointment.;Long Term Goal: Adherence to prescribed nutrition plan.           Nutrition Assessments:  MEDIFICTS Score Key:  ?70 Need to make dietary changes   40-70 Heart Healthy Diet  ? 40 Therapeutic Level Cholesterol Diet  Flowsheet Row Cardiac Rehab from 07/14/2020 in [facility] Cardiac and Pulmonary Rehab  Picture Your Plate Total Score on Admission 62     Picture Your Plate Scores:  <13 Unhealthy dietary pattern with much room for improvement.  41-50 Dietary pattern unlikely to meet recommendations for good health and room for  improvement.  51-60 More healthful dietary pattern, with some room for improvement.   >60 Healthy dietary pattern, although there may be some specific behaviors that could be improved.    Nutrition Goals Re-Evaluation:   Nutrition Goals Discharge (Final Nutrition Goals Re-Evaluation):   Psychosocial: Target Goals: Acknowledge presence or absence of significant depression and/or stress, maximize coping skills, provide positive support system. Participant is able to verbalize types and ability to use techniques and skills needed for reducing stress and depression.   Education: Stress, Anxiety, and Depression - Group verbal and visual presentation to define topics covered.  Reviews how body is impacted by stress, anxiety, and depression.  Also discusses healthy ways to reduce stress and to treat/manage anxiety and depression.  Written material given at graduation. Flowsheet Row Cardiac Rehab from 07/10/2018 in [facility] Cardiac and Pulmonary Rehab  Date 07/08/18  Educator [facility]  Instruction Review Code 1- [employer] Understanding      Education: Sleep Hygiene -Provides group verbal and written instruction about how sleep can affect your health.  Define sleep hygiene, discuss sleep cycles and impact of sleep habits. Review good sleep hygiene tips.  Flowsheet Row Cardiac Rehab from 07/10/2018 in [facility] Cardiac and Pulmonary Rehab  Date 06/10/18  Educator [facility]  Instruction Review Code 1- Verbalizes Understanding      Initial Review & Psychosocial Screening:  Initial Psych Review & Screening - 07/07/20 1008      Initial Review   Current issues with None Identified      Family Dynamics   Good Support System? Yes    Comments Her step daughter is great for support. Her niece is a great support system. She lives alone and has a positive outlook on her health.      Barriers   Psychosocial barriers to participate in program There are no identifiable barriers or psychosocial needs.;The patient should  benefit from training in stress management and relaxation.      Screening Interventions   Interventions Encouraged to exercise;Provide feedback about the scores to participant;To provide support and resources with identified psychosocial needs    Expected Outcomes Short Term goal: Utilizing psychosocial counselor, staff and physician to assist with identification of specific Stressors or current issues interfering with healing process. Setting desired goal for each stressor or current issue identified.;Long Term Goal: Stressors or current issues are controlled or eliminated.;Short Term goal: Identification and review with participant of any Quality of Life or  Depression concerns found by scoring the questionnaire.;Long Term goal: The participant improves quality of Life and PHQ9 Scores as seen by post scores and/or verbalization of changes           Quality of Life Scores:   Quality of Life - 07/14/20 1157      Quality of Life   Select Quality of Life      Quality of Life Scores   Health/Function Pre 25.77 %    Socioeconomic Pre 30 %    Psych/Spiritual Pre 30 %    Family Pre 30 %    GLOBAL Pre 28.08 %          Scores of 19 and below usually indicate a poorer quality of life in these areas.  A difference of  2-3 points is a clinically meaningful difference.  A difference of 2-3 points in the total score of the Quality of Life Index has been associated with significant improvement in overall quality of life, self-image, physical symptoms, and general health in studies assessing change in quality of life.  PHQ-9: Recent Review Flowsheet Data    Depression screen [facility] 2/9 07/14/2020 01/15/2019 05/29/2018   Decreased Interest 0 0 0   Down, Depressed, Hopeless 0 0 0   PHQ - 2 Score 0 0 0   Altered sleeping 1 1 0   Tired, decreased energy 1 1 1    Change in appetite 1 0 1   Feeling bad or failure about yourself  0 0 1   Trouble concentrating 0 0 0   Moving slowly or fidgety/restless 0 0 0    Suicidal thoughts 0 0 0   PHQ-9 Score 3 2 3    Difficult doing work/chores Somewhat difficult Not difficult at all Somewhat difficult     Interpretation of Total Score  Total Score Depression Severity:  1-4 = Minimal depression, 5-9 = Mild depression, 10-14 = Moderate depression, 15-19 = Moderately severe depression, 20-27 = Severe depression   Psychosocial Evaluation and Intervention:  Psychosocial Evaluation - 07/07/20 1010      Psychosocial Evaluation & Interventions   Interventions Encouraged to exercise with the program and follow exercise prescription;Stress management education;Relaxation education    Comments Her step daughter is great for support. Her niece is a great support system. She lives alone and has a positive outlook on her health.    Expected Outcomes Short: Exercise regularly to support mental health and notify staff of any changes. Long: maintain mental health and well being through teaching of rehab or prescribed medications independently.    Continue Psychosocial Services  Follow up required by staff           Psychosocial Re-Evaluation:   Psychosocial Discharge (Final Psychosocial Re-Evaluation):   Vocational Rehabilitation: Provide vocational rehab assistance to qualifying candidates.   Vocational Rehab Evaluation & Intervention:   Education: Education Goals: Education classes will be provided on a variety of topics geared toward better understanding of heart health and risk factor modification. Participant will state understanding/return demonstration of topics presented as noted by education test scores.  Learning Barriers/Preferences:  Learning Barriers/Preferences - 07/07/20 1007      Learning Barriers/Preferences   Learning Barriers None    Learning Preferences None           General Cardiac Education Topics:  AED/CPR: - Group verbal and written instruction with the use of models to demonstrate the basic use of the AED with the basic  ABC's of resuscitation.   Anatomy and Cardiac Procedures: -  Group verbal and visual presentation and models provide information about basic cardiac anatomy and function. Reviews the testing methods done to diagnose heart disease and the outcomes of the test results. Describes the treatment choices: Medical Management, Angioplasty, or Coronary Bypass Surgery for treating various heart conditions including Myocardial Infarction, Angina, Valve Disease, and Cardiac Arrhythmias.  Written material given at graduation. Flowsheet Row Cardiac Rehab from 07/14/2020 in [facility] Cardiac and Pulmonary Rehab  Education need identified 07/14/20      Medication Safety: - Group verbal and visual instruction to review commonly prescribed medications for heart and lung disease. Reviews the medication, class of the drug, and side effects. Includes the steps to properly store meds and maintain the prescription regimen.  Written material given at graduation.   Intimacy: - Group verbal instruction through game format to discuss how heart and lung disease can affect sexual intimacy. Written material given at graduation.. Flowsheet Row Cardiac Rehab from 07/10/2018 in [facility] Cardiac and Pulmonary Rehab  Date 07/03/18  Educator [facility]  Instruction Review Code 1- Verbalizes Understanding      Know Your Numbers and Heart Failure: - Group verbal and visual instruction to discuss disease risk factors for cardiac and pulmonary disease and treatment options.  Reviews associated critical values for Overweight/Obesity, Hypertension, Cholesterol, and Diabetes.  Discusses basics of heart failure: signs/symptoms and treatments.  Introduces Heart Failure Zone chart for action plan for heart failure.  Written material given at graduation.   Infection Prevention: - Provides verbal and written material to individual with discussion of infection control including proper hand washing and proper equipment cleaning during exercise  session. Flowsheet Row Cardiac Rehab from 07/14/2020 in [facility] Cardiac and Pulmonary Rehab  Date 07/07/20  Educator [facility]  Instruction Review Code 1- Verbalizes Understanding      Falls Prevention: - Provides verbal and written material to individual with discussion of falls prevention and safety. Flowsheet Row Cardiac Rehab from 07/14/2020 in [facility] Cardiac and Pulmonary Rehab  Date 07/07/20  Educator [facility]  Instruction Review Code 1- Verbalizes Understanding      Other: -Provides group and verbal instruction on various topics (see comments)   Knowledge Questionnaire Score:  Knowledge Questionnaire Score - 07/14/20 1159      Knowledge Questionnaire Score   Pre Score 24/26 Education Focus: Angina, Nutrition           Core Components/Risk Factors/Patient Goals at Admission:  Personal Goals and Risk Factors at Admission - 07/14/20 1159      Core Components/Risk Factors/Patient Goals on Admission    Weight Management Yes;Weight Loss;Obesity    Intervention Weight Management: Develop a combined nutrition and exercise program designed to reach desired caloric intake, while maintaining appropriate intake of nutrient and fiber, sodium and fats, and appropriate energy expenditure required for the weight goal.;Weight Management: Provide education and appropriate resources to help participant work on and attain dietary goals.;Weight Management/Obesity: Establish reasonable short term and long term weight goals.;Obesity: Provide education and appropriate resources to help participant work on and attain dietary goals.    Admit Weight 175 lb 6.4 oz (79.6 kg)    Goal Weight: Short Term 170 lb (77.1 kg)    Goal Weight: Long Term 165 lb (74.8 kg)    Expected Outcomes Short Term: Continue to assess and modify interventions until short term weight is achieved;Long Term: Adherence to nutrition and physical activity/exercise program aimed toward attainment of established weight goal;Weight Loss:  Understanding of general recommendations for a balanced deficit meal plan, which promotes 1-2  lb weight loss per week and includes a negative energy balance of [phone] kcal/d;Understanding recommendations for meals to include 15-35% energy as protein, 25-35% energy from fat, 35-60% energy from carbohydrates, less than  of dietary cholesterol, 20-35 gm of total fiber daily;Understanding of distribution of calorie intake throughout the day with the consumption of 4-5 meals/snacks    Hypertension Yes    Intervention Provide education on lifestyle modifcations including regular physical activity/exercise, weight management, moderate sodium restriction and increased consumption of fresh fruit, vegetables, and low fat dairy, alcohol moderation, and smoking cessation.;Monitor prescription use compliance.    Expected Outcomes Short Term: Continued assessment and intervention until BP is < 140/56mm HG in hypertensive participants. < 130/26mm HG in hypertensive participants with diabetes, heart failure or chronic kidney disease.;Long Term: Maintenance of blood pressure at goal levels.    Lipids Yes    Intervention Provide education and support for participant on nutrition & aerobic/resistive exercise along with prescribed medications to achieve LDL 70mg , HDL >40mg .    Expected Outcomes Short Term: Participant states understanding of desired cholesterol values and is compliant with medications prescribed. Participant is following exercise prescription and nutrition guidelines.;Long Term: Cholesterol controlled with medications as prescribed, with individualized exercise RX and with personalized nutrition plan. Value goals: LDL < , HDL > 40 mg.           Education:Diabetes - Individual verbal and written instruction to review signs/symptoms of diabetes, desired ranges of glucose level fasting, after meals and with exercise. Acknowledge that pre and post exercise glucose checks will be done for 3 sessions  at entry of program.   Core Components/Risk Factors/Patient Goals Review:    Core Components/Risk Factors/Patient Goals at Discharge (Final Review):    ITP Comments:  ITP Comments    Row Name 07/07/20 1007 07/14/20 1150         ITP Comments Virtual Visit completed. Patient informed on EP and RD appointment and 6 Minute walk test. Patient also informed of patient health questionnaires on My Chart. Patient Verbalizes understanding. Visit diagnosis can be found in [facility] 02/26/2020. Completed and gym orientation. Initial ITP created and sent for review to Dr. Bethann Punches, Medical Director.             Comments: Initial ITP

## 2020-07-14 NOTE — Patient Instructions (Signed)
Patient Instructions  Patient Details  Name: Monica Bush MRN: [id number] Date of Birth: Apr 16, 1941 Referring Provider:  Ramond Dial,*  Below are your personal goals for exercise, nutrition, and risk factors. Our goal is to help you stay on track towards obtaining and maintaining these goals. We will be discussing your progress on these goals with you throughout the program.  Initial Exercise Prescription:  Initial Exercise Prescription - 07/14/20 1100      Date of Initial Exercise RX and Referring Provider   Date 07/14/20    Referring Provider Ciro Backer MD      Treadmill   MPH 2    Grade 0.5    Minutes 15    METs 2.75      Recumbant Bike   Level 1    RPM 50    Watts 10    Minutes 15    METs 2      NuStep   Level 1    SPM 80    Minutes 15    METs 2      REL-XR   Level 1    Speed 50    Minutes 15    METs 2      T5 Nustep   Level 1    SPM 80    Minutes 15    METs 2      Prescription Details   Frequency (times per week) 3    Duration Progress to 30 minutes of continuous aerobic without signs/symptoms of physical distress      Intensity   THRR 40-80% of Max Heartrate 105-130    Ratings of Perceived Exertion 11-13    Perceived Dyspnea 0-4      Progression   Progression Continue to progress workloads to maintain intensity without signs/symptoms of physical distress.      Resistance Training   Training Prescription Yes    Weight 3 lb    Reps 10-15           Exercise Goals: Frequency: Be able to perform aerobic exercise two to three times per week in program working toward 2-5 days per week of home exercise.  Intensity: Work with a perceived exertion of 11 (fairly light) - 15 (hard) while following your exercise prescription.  We will make changes to your prescription with you as you progress through the program.   Duration: Be able to do 30 to 45 minutes of continuous aerobic exercise in addition to a 5 minute warm-up and a 5 minute  cool-down routine.   Nutrition Goals: Your personal nutrition goals will be established when you do your nutrition analysis with the dietician.  The following are general nutrition guidelines to follow: Cholesterol < 200mg /day Sodium < 1500mg /day Fiber: Women over 50 yrs - 21 grams per day  Personal Goals:  Personal Goals and Risk Factors at Admission - 07/14/20 1159      Core Components/Risk Factors/Patient Goals on Admission    Weight Management Yes;Weight Loss;Obesity    Intervention Weight Management: Develop a combined nutrition and exercise program designed to reach desired caloric intake, while maintaining appropriate intake of nutrient and fiber, sodium and fats, and appropriate energy expenditure required for the weight goal.;Weight Management: Provide education and appropriate resources to help participant work on and attain dietary goals.;Weight Management/Obesity: Establish reasonable short term and long term weight goals.;Obesity: Provide education and appropriate resources to help participant work on and attain dietary goals.    Admit Weight 175 lb 6.4 oz (79.6 kg)  Goal Weight: Short Term 170 lb (77.1 kg)    Goal Weight: Long Term 165 lb (74.8 kg)    Expected Outcomes Short Term: Continue to assess and modify interventions until short term weight is achieved;Long Term: Adherence to nutrition and physical activity/exercise program aimed toward attainment of established weight goal;Weight Loss: Understanding of general recommendations for a balanced deficit meal plan, which promotes 1-2 lb weight loss per week and includes a negative energy balance of [id number] kcal/d;Understanding recommendations for meals to include 15-35% energy as protein, 25-35% energy from fat, 35-60% energy from carbohydrates, less than 200mg  of dietary cholesterol, 20-35 gm of total fiber daily;Understanding of distribution of calorie intake throughout the day with the consumption of 4-5 meals/snacks     Hypertension Yes    Intervention Provide education on lifestyle modifcations including regular physical activity/exercise, weight management, moderate sodium restriction and increased consumption of fresh fruit, vegetables, and low fat dairy, alcohol moderation, and smoking cessation.;Monitor prescription use compliance.    Expected Outcomes Short Term: Continued assessment and intervention until BP is < 140/42mm HG in hypertensive participants. < 130/24mm HG in hypertensive participants with diabetes, heart failure or chronic kidney disease.;Long Term: Maintenance of blood pressure at goal levels.    Lipids Yes    Intervention Provide education and support for participant on nutrition & aerobic/resistive exercise along with prescribed medications to achieve LDL 70mg , HDL >40mg .    Expected Outcomes Short Term: Participant states understanding of desired cholesterol values and is compliant with medications prescribed. Participant is following exercise prescription and nutrition guidelines.;Long Term: Cholesterol controlled with medications as prescribed, with individualized exercise RX and with personalized nutrition plan. Value goals: LDL < 70mg , HDL > 40 mg.           Tobacco Use Initial Evaluation: Social History   Tobacco Use  Smoking Status Never Smoker  Smokeless Tobacco Never Used    Exercise Goals and Review:  Exercise Goals    Row Name 07/14/20 1153             Exercise Goals   Increase Physical Activity Yes       Intervention Provide advice, education, support and counseling about physical activity/exercise needs.;Develop an individualized exercise prescription for aerobic and resistive training based on initial evaluation findings, risk stratification, comorbidities and participant's personal goals.       Expected Outcomes Short Term: Attend rehab on a regular basis to increase amount of physical activity.;Long Term: Add in home exercise to make exercise part of routine and  to increase amount of physical activity.;Long Term: Exercising regularly at least 3-5 days a week.       Increase Strength and Stamina Yes       Intervention Provide advice, education, support and counseling about physical activity/exercise needs.;Develop an individualized exercise prescription for aerobic and resistive training based on initial evaluation findings, risk stratification, comorbidities and participant's personal goals.       Expected Outcomes Short Term: Increase workloads from initial exercise prescription for resistance, speed, and METs.;Short Term: Perform resistance training exercises routinely during rehab and add in resistance training at home;Long Term: Improve cardiorespiratory fitness, muscular endurance and strength as measured by increased METs and functional capacity ( )       Able to understand and use rate of perceived exertion (RPE) scale Yes       Intervention Provide education and explanation on how to use RPE scale       Expected Outcomes Short Term: Able to use RPE daily in  rehab to express subjective intensity level;Long Term:  Able to use RPE to guide intensity level when exercising independently       Able to understand and use Dyspnea scale Yes       Intervention Provide education and explanation on how to use Dyspnea scale       Expected Outcomes Long Term: Able to use Dyspnea scale to guide intensity level when exercising independently;Short Term: Able to use Dyspnea scale daily in rehab to express subjective sense of shortness of breath during exertion       Knowledge and understanding of Target Heart Rate Range (THRR) Yes       Intervention Provide education and explanation of THRR including how the numbers were predicted and where they are located for reference       Expected Outcomes Short Term: Able to use daily as guideline for intensity in rehab;Short Term: Able to state/look up THRR;Long Term: Able to use THRR to govern intensity when exercising  independently       Able to check pulse independently Yes       Intervention Provide education and demonstration on how to check pulse in carotid and radial arteries.;Review the importance of being able to check your own pulse for safety during independent exercise       Expected Outcomes Short Term: Able to explain why pulse checking is important during independent exercise;Long Term: Able to check pulse independently and accurately       Understanding of Exercise Prescription Yes       Intervention Provide education, explanation, and written materials on patient's individual exercise prescription       Expected Outcomes Short Term: Able to explain program exercise prescription;Long Term: Able to explain home exercise prescription to exercise independently              Copy of goals given to participant.

## 2020-07-15 ENCOUNTER — Encounter: Payer: Medicare Other | Admitting: *Deleted

## 2020-07-15 ENCOUNTER — Other Ambulatory Visit: Payer: Self-pay

## 2020-07-15 DIAGNOSIS — Z955 Presence of coronary angioplasty implant and graft: Secondary | ICD-10-CM

## 2020-07-15 NOTE — Progress Notes (Signed)
Daily Session Note  Patient Details  Name: Arisha Gervais MRN: [id number] Date of Birth: May 17, 1940 Referring Provider:   Flowsheet Row Cardiac Rehab from 07/14/2020 in [facility]  Referring Provider Teodoro Kil MD      Encounter Date: 07/15/2020  Check In:  Session Check In - 07/15/20 1106      Check-In   Supervising physician immediately available to respond to emergencies See telemetry face sheet for immediately available ER MD    Location [facility]-Cardiac & Pulmonary Rehab    Staff Present Renita Papa, RN BSN;Joseph [address] [place], [place], RCEP, CCRP, CCET    Virtual Visit No    Medication changes reported     No    Fall or balance concerns reported    No    Warm-up and Cool-down Performed on first and last piece of equipment    Resistance Training Performed Yes    VAD Patient? No    PAD/SET Patient? No      Pain Assessment   Currently in Pain? No/denies              Social History   Tobacco Use  Smoking Status Never Smoker  Smokeless Tobacco Never Used    Goals Met:  Independence with exercise equipment Exercise tolerated well No report of cardiac concerns or symptoms Strength training completed today  Goals Unmet:  Not Applicable  Comments: First full day of exercise!  Patient was oriented to gym and equipment including functions, settings, policies, and procedures.  Patient's individual exercise prescription and treatment plan were reviewed.  All starting workloads were established based on the results of the 6 minute walk test done at initial orientation visit.  The plan for exercise progression was also introduced and progression will be customized based on patient's performance and goals.    Dr. Emily Filbert is Medical Director for [place] and [facility].

## 2020-07-22 ENCOUNTER — Other Ambulatory Visit: Payer: Self-pay

## 2020-07-22 ENCOUNTER — Encounter: Payer: Medicare Other | Admitting: *Deleted

## 2020-07-22 DIAGNOSIS — Z955 Presence of coronary angioplasty implant and graft: Secondary | ICD-10-CM | POA: Diagnosis not present

## 2020-07-22 DIAGNOSIS — Z9861 Coronary angioplasty status: Secondary | ICD-10-CM

## 2020-07-22 NOTE — Progress Notes (Signed)
Daily Session Note  Patient Details  Name: Monica Bush MRN: [id number] Date of Birth: Aug 05, 1940 Referring Provider:   Flowsheet Row Cardiac Rehab from 07/14/2020 in [facility]  Referring Provider Teodoro Kil MD      Encounter Date: 07/22/2020  Check In:  Session Check In - 07/22/20 1055      Check-In   Supervising physician immediately available to respond to emergencies See telemetry face sheet for immediately available ER MD    Location [facility] Pul[facility]    Staff Present Renita Papa, RN BSN;Joseph [address] [place], [place], RCEP, CCRP, CCET    Virtual Visit No    Medication changes reported     No    Fall or balance concerns reported    No    Warm-up and Cool-down Performed on first and last piece of equipment    Resistance Training Performed Yes    VAD Patient? No    PAD/SET Patient? No      Pain Assessment   Currently in Pain? No/denies              Social History   Tobacco Use  Smoking Status Never Smoker  Smokeless Tobacco Never Used    Goals Met:  Independence with exercise equipment Exercise tolerated well No report of cardiac concerns or symptoms Strength training completed today  Goals Unmet:  Not Applicable  Comments: Pt able to follow exercise prescription today without complaint.  Will continue to monitor for progression.    Dr. Emily Filbert is Medical Director for [place] and [facility].

## 2020-07-27 ENCOUNTER — Encounter: Payer: Self-pay | Admitting: *Deleted

## 2020-07-27 DIAGNOSIS — Z955 Presence of coronary angioplasty implant and graft: Secondary | ICD-10-CM

## 2020-07-27 NOTE — Progress Notes (Signed)
Cardiac Individual Treatment Plan  Patient Details  Name: Monica Bush MRN: [id number] Date of Birth: 04-25-1941 Referring Provider:   Flowsheet Row Cardiac Rehab from 07/14/2020 in [facility] Cardiac and Pulmonary Rehab  Referring Provider Ciro Backer MD      Initial Encounter Date:  Flowsheet Row Cardiac Rehab from 07/14/2020 in [facility] Cardiac and Pulmonary Rehab  Date 07/14/20      Visit Diagnosis: Status post coronary artery stent placement  Patient's Home Medications on Admission:  Current Outpatient Medications:  .  acetaminophen (TYLENOL) 500 MG tablet, Take by mouth., Disp: , Rfl:  .  amLODipine (NORVASC) 10 MG tablet, Take 10 mg by mouth daily., Disp: , Rfl:  .  amLODipine (NORVASC) 10 MG tablet, Take 1 tablet by mouth daily. (Patient not taking: Reported on 07/07/2020), Disp: , Rfl:  .  ascorbic acid (VITAMIN C) 100 MG tablet, Take by mouth., Disp: , Rfl:  .  aspirin 81 MG chewable tablet, Chew 81 mg by mouth daily., Disp: , Rfl:  .  atorvastatin (LIPITOR) 40 MG tablet, Take 40 mg by mouth daily., Disp: , Rfl:  .  azelastine (ASTELIN) 0.1 % nasal spray, Place into the nose., Disp: , Rfl:  .  BRILINTA 90 MG TABS tablet, , Disp: , Rfl:  .  chlorpheniramine (CHLOR-TRIMETON) 4 MG tablet, Take by mouth., Disp: , Rfl:  .  Cholecalciferol (VITAMIN D3) 25 MCG (1000 UT) CAPS, Take by mouth., Disp: , Rfl:  .  clopidogrel (PLAVIX) 75 MG tablet, Take by mouth., Disp: , Rfl:  .  famotidine (PEPCID) 40 MG tablet, Take by mouth., Disp: , Rfl:  .  fluticasone (FLONASE) 50 MCG/ACT nasal spray, Place into the nose., Disp: , Rfl:  .  Multiple Vitamin (MULTI-VITAMIN) tablet, Take 1 tablet by mouth daily., Disp: , Rfl:  .  nitroGLYCERIN (NITROSTAT) 0.4 MG SL tablet, , Disp: , Rfl:  .  nitroGLYCERIN (NITROSTAT) 0.4 MG SL tablet, Place 1 tablet under the tongue every 5 (five) minutes as needed. (Patient not taking: Reported on 07/07/2020), Disp: , Rfl:  .  nystatin ointment (MYCOSTATIN), Apply topically 2  (two) times daily., Disp: , Rfl:  .  STUDY - ASPIRE - aspirin 81 mg or placebo tablet (PI-Sethi), Take 1 tablet by mouth daily., Disp: , Rfl:   Past Medical History: Past Medical History:  Diagnosis Date  . Anxiety   . Asthma   . CAD (coronary artery disease)   . Hyperlipemia   . Hypertension   . Osteopetrosis   . Reflux esophagitis     Tobacco Use: Social History   Tobacco Use  Smoking Status Never Smoker  Smokeless Tobacco Never Used    Labs: Recent Review Flowsheet Data   There is no flowsheet data to display.      Exercise Target Goals: Exercise Program Goal: Individual exercise prescription set using results from initial 6 min walk test and THRR while considering  patient's activity barriers and safety.   Exercise Prescription Goal: Initial exercise prescription builds to 30-45 minutes a day of aerobic activity, 2-3 days per week.  Home exercise guidelines will be given to patient during program as part of exercise prescription that the participant will acknowledge.   Education: Aerobic Exercise: - Group verbal and visual presentation on the components of exercise prescription. Introduces F.I.T.T principle from ACSM for exercise prescriptions.  Reviews F.I.T.T. principles of aerobic exercise including progression. Written material given at graduation.   Education: Resistance Exercise: - Group verbal and visual presentation on the components  of exercise prescription. Introduces F.I.T.T principle from ACSM for exercise prescriptions  Reviews F.I.T.T. principles of resistance exercise including progression. Written material given at graduation.    Education: Exercise & Equipment Safety: - Individual verbal instruction and demonstration of equipment use and safety with use of the equipment. Flowsheet Row Cardiac Rehab from 07/14/2020 in [facility] Cardiac and Pulmonary Rehab  Date 07/07/20  Educator [facility]  Instruction Review Code 1- Verbalizes Understanding       Education: Exercise Physiology & General Exercise Guidelines: - Group verbal and written instruction with models to review the exercise physiology of the cardiovascular system and associated critical values. Provides general exercise guidelines with specific guidelines to those with heart or lung disease.    Education: Flexibility, Balance, Mind/Body Relaxation: - Group verbal and visual presentation with interactive activity on the components of exercise prescription. Introduces F.I.T.T principle from ACSM for exercise prescriptions. Reviews F.I.T.T. principles of flexibility and balance exercise training including progression. Also discusses the mind body connection.  Reviews various relaxation techniques to help reduce and manage stress (i.e. Deep breathing, progressive muscle relaxation, and visualization). Balance handout provided to take home. Written material given at graduation.   Activity Barriers & Risk Stratification:  Activity Barriers & Cardiac Risk Stratification - 07/14/20 1151      Activity Barriers & Cardiac Risk Stratification   Activity Barriers Deconditioning;Muscular Weakness;Balance Concerns    Cardiac Risk Stratification Moderate           6 Minute Walk:  6 Minute Walk    Row Name 07/14/20 1150         6 Minute Walk   Phase Initial     Distance 1195 feet     Walk Time 6 minutes     # of Rest Breaks 0     MPH 2.26     METS 1.72     RPE 12     Perceived Dyspnea  1     VO2 Peak 6.03     Symptoms Yes (comment)     Comments balance felt off at end     Resting HR 80 bpm     Resting BP 122/80     Resting Oxygen Saturation  96 %     Exercise Oxygen Saturation  during 6 min walk 96 %     Max Ex. HR 100 bpm     Max Ex. BP 126/66     2 Minute Post BP 120/66            Oxygen Initial Assessment:   Oxygen Re-Evaluation:   Oxygen Discharge (Final Oxygen Re-Evaluation):   Initial Exercise Prescription:  Initial Exercise Prescription - 07/14/20  1100      Date of Initial Exercise RX and Referring Provider   Date 07/14/20    Referring Provider Ciro Backer MD      Treadmill   MPH 2    Grade 0.5    Minutes 15    METs 2.75      Recumbant Bike   Level 1    RPM 50    Watts 10    Minutes 15    METs 2      NuStep   Level 1    SPM 80    Minutes 15    METs 2      REL-XR   Level 1    Speed 50    Minutes 15    METs 2      T5 Nustep   Level 1  SPM 80    Minutes 15    METs 2      Prescription Details   Frequency (times per week) 3    Duration Progress to 30 minutes of continuous aerobic without signs/symptoms of physical distress      Intensity   THRR 40-80% of Max Heartrate 105-130    Ratings of Perceived Exertion 11-13    Perceived Dyspnea 0-4      Progression   Progression Continue to progress workloads to maintain intensity without signs/symptoms of physical distress.      Resistance Training   Training Prescription Yes    Weight 3 lb    Reps 10-15           Perform Capillary Blood Glucose checks as needed.  Exercise Prescription Changes:  Exercise Prescription Changes    Row Name 07/14/20 1100 07/19/20 0800           Response to Exercise   Blood Pressure (Admit) 122/80 126/64      Blood Pressure (Exercise) 126/66 122/62      Blood Pressure (Exit) 120/66 102/60      Heart Rate (Admit) 80 bpm 86 bpm      Heart Rate (Exercise) 100 bpm 112 bpm      Heart Rate (Exit) 71 bpm 93 bpm      Oxygen Saturation (Admit) 96 % --      Oxygen Saturation (Exercise) 96 % --      Rating of Perceived Exertion (Exercise) 12 13      Perceived Dyspnea (Exercise) 1 --      Symptoms balance off at end none      Comments walk test results first full day of exercise      Duration -- Continue with 30 min of aerobic exercise without signs/symptoms of physical distress.      Intensity -- THRR unchanged             Progression   Progression -- Continue to progress workloads to maintain intensity without  signs/symptoms of physical distress.      Average METs -- 2.6             Resistance Training   Training Prescription -- Yes      Weight -- 3 lb      Reps -- 10-15             Interval Training   Interval Training -- No             Recumbant Bike   Level -- 3      Minutes -- 15      METs -- 2.9             NuStep   Level -- 3      Minutes -- 15      METs -- 2.3             Exercise Comments:   Exercise Goals and Review:  Exercise Goals    Row Name 07/14/20 1153             Exercise Goals   Increase Physical Activity Yes       Intervention Provide advice, education, support and counseling about physical activity/exercise needs.;Develop an individualized exercise prescription for aerobic and resistive training based on initial evaluation findings, risk stratification, comorbidities and participant's personal goals.       Expected Outcomes Short Term: Attend rehab on a regular basis to increase amount of physical activity.;Long Term: Add in home  exercise to make exercise part of routine and to increase amount of physical activity.;Long Term: Exercising regularly at least 3-5 days a week.       Increase Strength and Stamina Yes       Intervention Provide advice, education, support and counseling about physical activity/exercise needs.;Develop an individualized exercise prescription for aerobic and resistive training based on initial evaluation findings, risk stratification, comorbidities and participant's personal goals.       Expected Outcomes Short Term: Increase workloads from initial exercise prescription for resistance, speed, and METs.;Short Term: Perform resistance training exercises routinely during rehab and add in resistance training at home;Long Term: Improve cardiorespiratory fitness, muscular endurance and strength as measured by increased METs and functional capacity (6MWT)       Able to understand and use rate of perceived exertion (RPE) scale Yes        Intervention Provide education and explanation on how to use RPE scale       Expected Outcomes Short Term: Able to use RPE daily in rehab to express subjective intensity level;Long Term:  Able to use RPE to guide intensity level when exercising independently       Able to understand and use Dyspnea scale Yes       Intervention Provide education and explanation on how to use Dyspnea scale       Expected Outcomes Long Term: Able to use Dyspnea scale to guide intensity level when exercising independently;Short Term: Able to use Dyspnea scale daily in rehab to express subjective sense of shortness of breath during exertion       Knowledge and understanding of Target Heart Rate Range (THRR) Yes       Intervention Provide education and explanation of THRR including how the numbers were predicted and where they are located for reference       Expected Outcomes Short Term: Able to use daily as guideline for intensity in rehab;Short Term: Able to state/look up THRR;Long Term: Able to use THRR to govern intensity when exercising independently       Able to check pulse independently Yes       Intervention Provide education and demonstration on how to check pulse in carotid and radial arteries.;Review the importance of being able to check your own pulse for safety during independent exercise       Expected Outcomes Short Term: Able to explain why pulse checking is important during independent exercise;Long Term: Able to check pulse independently and accurately       Understanding of Exercise Prescription Yes       Intervention Provide education, explanation, and written materials on patient's individual exercise prescription       Expected Outcomes Short Term: Able to explain program exercise prescription;Long Term: Able to explain home exercise prescription to exercise independently              Exercise Goals Re-Evaluation :  Exercise Goals Re-Evaluation    Row Name 07/15/20 1108 07/19/20 0849            Exercise Goal Re-Evaluation   Exercise Goals Review Increase Physical Activity;Able to understand and use rate of perceived exertion (RPE) scale;Knowledge and understanding of Target Heart Rate Range (THRR);Understanding of Exercise Prescription;Increase Strength and Stamina;Able to understand and use Dyspnea scale;Able to check pulse independently Increase Physical Activity;Increase Strength and Stamina;Understanding of Exercise Prescription      Comments Reviewed RPE and dyspnea scales, THR and program prescription with pt today.  Pt voiced understanding and was given a  copy of goals to take home. Only attended first day so far.  We will continue to monitor      Expected Outcomes Short: Use RPE daily to regulate intensity. Long: Follow program prescription in THR. Short: Attend rehab regularly Long: contiue to follow program prescription             Discharge Exercise Prescription (Final Exercise Prescription Changes):  Exercise Prescription Changes - 07/19/20 0800      Response to Exercise   Blood Pressure (Admit) 126/64    Blood Pressure (Exercise) 122/62    Blood Pressure (Exit) 102/60    Heart Rate (Admit) 86 bpm    Heart Rate (Exercise) 112 bpm    Heart Rate (Exit) 93 bpm    Rating of Perceived Exertion (Exercise) 13    Symptoms none    Comments first full day of exercise    Duration Continue with 30 min of aerobic exercise without signs/symptoms of physical distress.    Intensity THRR unchanged      Progression   Progression Continue to progress workloads to maintain intensity without signs/symptoms of physical distress.    Average METs 2.6      Resistance Training   Training Prescription Yes    Weight 3 lb    Reps 10-15      Interval Training   Interval Training No      Recumbant Bike   Level 3    Minutes 15    METs 2.9      NuStep   Level 3    Minutes 15    METs 2.3           Nutrition:  Target Goals: Understanding of nutrition guidelines, daily intake  of sodium 1500mg , cholesterol 200mg , calories 30% from fat and 7% or less from saturated fats, daily to have 5 or more servings of fruits and vegetables.  Education: All About Nutrition: -Group instruction provided by verbal, written material, interactive activities, discussions, models, and posters to present general guidelines for heart healthy nutrition including fat, fiber, MyPlate, the role of sodium in heart healthy nutrition, utilization of the nutrition label, and utilization of this knowledge for meal planning. Follow up email sent as well. Written material given at graduation. Flowsheet Row Cardiac Rehab from 07/14/2020 in [facility] Cardiac and Pulmonary Rehab  Education need identified 07/14/20      Biometrics:  Pre Biometrics - 07/14/20 1154      Pre Biometrics   Height 5' 1.1" (1.552 m)    Weight 175 lb 6.4 oz (79.6 kg)    BMI (Calculated) 33.03    Single Leg Stand 8.1 seconds            Nutrition Therapy Plan and Nutrition Goals:  Nutrition Therapy & Goals - 07/14/20 1157      Intervention Plan   Intervention Prescribe, educate and counsel regarding individualized specific dietary modifications aiming towards targeted core components such as weight, hypertension, lipid management, diabetes, heart failure and other comorbidities.    Expected Outcomes Short Term Goal: Understand basic principles of dietary content, such as calories, fat, sodium, cholesterol and nutrients.;Short Term Goal: A plan has been developed with personal nutrition goals set during dietitian appointment.;Long Term Goal: Adherence to prescribed nutrition plan.           Nutrition Assessments:  MEDIFICTS Score Key:  ?70 Need to make dietary changes   40-70 Heart Healthy Diet  ? 40 Therapeutic Level Cholesterol Diet  Flowsheet Row Cardiac Rehab from 07/14/2020 in  [facility] Cardiac and Pulmonary Rehab  Picture Your Plate Total Score on Admission 62     Picture Your Plate Scores:  <67 Unhealthy  dietary pattern with much room for improvement.  41-50 Dietary pattern unlikely to meet recommendations for good health and room for improvement.  51-60 More healthful dietary pattern, with some room for improvement.   >60 Healthy dietary pattern, although there may be some specific behaviors that could be improved.    Nutrition Goals Re-Evaluation:   Nutrition Goals Discharge (Final Nutrition Goals Re-Evaluation):   Psychosocial: Target Goals: Acknowledge presence or absence of significant depression and/or stress, maximize coping skills, provide positive support system. Participant is able to verbalize types and ability to use techniques and skills needed for reducing stress and depression.   Education: Stress, Anxiety, and Depression - Group verbal and visual presentation to define topics covered.  Reviews how body is impacted by stress, anxiety, and depression.  Also discusses healthy ways to reduce stress and to treat/manage anxiety and depression.  Written material given at graduation. Flowsheet Row Cardiac Rehab from 07/10/2018 in [facility] Cardiac and Pulmonary Rehab  Date 07/08/18  Educator [facility]  Instruction Review Code 1- [employer] Understanding      Education: Sleep Hygiene -Provides group verbal and written instruction about how sleep can affect your health.  Define sleep hygiene, discuss sleep cycles and impact of sleep habits. Review good sleep hygiene tips.  Flowsheet Row Cardiac Rehab from 07/10/2018 in [facility] Cardiac and Pulmonary Rehab  Date 06/10/18  Educator [facility]  Instruction Review Code 1- Verbalizes Understanding      Initial Review & Psychosocial Screening:  Initial Psych Review & Screening - 07/07/20 1008      Initial Review   Current issues with None Identified      Family Dynamics   Good Support System? Yes    Comments Her step daughter is great for support. Her niece is a great support system. She lives alone and has a positive outlook on her health.       Barriers   Psychosocial barriers to participate in program There are no identifiable barriers or psychosocial needs.;The patient should benefit from training in stress management and relaxation.      Screening Interventions   Interventions Encouraged to exercise;Provide feedback about the scores to participant;To provide support and resources with identified psychosocial needs    Expected Outcomes Short Term goal: Utilizing psychosocial counselor, staff and physician to assist with identification of specific Stressors or current issues interfering with healing process. Setting desired goal for each stressor or current issue identified.;Long Term Goal: Stressors or current issues are controlled or eliminated.;Short Term goal: Identification and review with participant of any Quality of Life or Depression concerns found by scoring the questionnaire.;Long Term goal: The participant improves quality of Life and PHQ9 Scores as seen by post scores and/or verbalization of changes           Quality of Life Scores:   Quality of Life - 07/14/20 1157      Quality of Life   Select Quality of Life      Quality of Life Scores   Health/Function Pre 25.77 %    Socioeconomic Pre 30 %    Psych/Spiritual Pre 30 %    Family Pre 30 %    GLOBAL Pre 28.08 %          Scores of 19 and below usually indicate a poorer quality of life in these areas.  A difference of  2-3  points is a clinically meaningful difference.  A difference of 2-3 points in the total score of the Quality of Life Index has been associated with significant improvement in overall quality of life, self-image, physical symptoms, and general health in studies assessing change in quality of life.  PHQ-9: Recent Review Flowsheet Data    Depression screen [facility] 2/9 07/14/2020 01/15/2019 05/29/2018   Decreased Interest 0 0 0   Down, Depressed, Hopeless 0 0 0   PHQ - 2 Score 0 0 0   Altered sleeping 1 1 0   Tired, decreased energy 1 1 1    Change in  appetite 1 0 1   Feeling bad or failure about yourself  0 0 1   Trouble concentrating 0 0 0   Moving slowly or fidgety/restless 0 0 0   Suicidal thoughts 0 0 0   PHQ-9 Score 3 2 3    Difficult doing work/chores Somewhat difficult Not difficult at all Somewhat difficult     Interpretation of Total Score  Total Score Depression Severity:  1-4 = Minimal depression, 5-9 = Mild depression, 10-14 = Moderate depression, 15-19 = Moderately severe depression, 20-27 = Severe depression   Psychosocial Evaluation and Intervention:  Psychosocial Evaluation - 07/07/20 1010      Psychosocial Evaluation & Interventions   Interventions Encouraged to exercise with the program and follow exercise prescription;Stress management education;Relaxation education    Comments Her step daughter is great for support. Her niece is a great support system. She lives alone and has a positive outlook on her health.    Expected Outcomes Short: Exercise regularly to support mental health and notify staff of any changes. Long: maintain mental health and well being through teaching of rehab or prescribed medications independently.    Continue Psychosocial Services  Follow up required by staff           Psychosocial Re-Evaluation:   Psychosocial Discharge (Final Psychosocial Re-Evaluation):   Vocational Rehabilitation: Provide vocational rehab assistance to qualifying candidates.   Vocational Rehab Evaluation & Intervention:   Education: Education Goals: Education classes will be provided on a variety of topics geared toward better understanding of heart health and risk factor modification. Participant will state understanding/return demonstration of topics presented as noted by education test scores.  Learning Barriers/Preferences:  Learning Barriers/Preferences - 07/07/20 1007      Learning Barriers/Preferences   Learning Barriers None    Learning Preferences None           General Cardiac Education  Topics:  AED/CPR: - Group verbal and written instruction with the use of models to demonstrate the basic use of the AED with the basic ABC's of resuscitation.   Anatomy and Cardiac Procedures: - Group verbal and visual presentation and models provide information about basic cardiac anatomy and function. Reviews the testing methods done to diagnose heart disease and the outcomes of the test results. Describes the treatment choices: Medical Management, Angioplasty, or Coronary Bypass Surgery for treating various heart conditions including Myocardial Infarction, Angina, Valve Disease, and Cardiac Arrhythmias.  Written material given at graduation. Flowsheet Row Cardiac Rehab from 07/14/2020 in [facility] Cardiac and Pulmonary Rehab  Education need identified 07/14/20      Medication Safety: - Group verbal and visual instruction to review commonly prescribed medications for heart and lung disease. Reviews the medication, class of the drug, and side effects. Includes the steps to properly store meds and maintain the prescription regimen.  Written material given at graduation.   Intimacy: - Group  verbal instruction through game format to discuss how heart and lung disease can affect sexual intimacy. Written material given at graduation.. Flowsheet Row Cardiac Rehab from 07/10/2018 in [facility] Cardiac and Pulmonary Rehab  Date 07/03/18  Educator [facility]  Instruction Review Code 1- Verbalizes Understanding      Know Your Numbers and Heart Failure: - Group verbal and visual instruction to discuss disease risk factors for cardiac and pulmonary disease and treatment options.  Reviews associated critical values for Overweight/Obesity, Hypertension, Cholesterol, and Diabetes.  Discusses basics of heart failure: signs/symptoms and treatments.  Introduces Heart Failure Zone chart for action plan for heart failure.  Written material given at graduation.   Infection Prevention: - Provides verbal and written material to  individual with discussion of infection control including proper hand washing and proper equipment cleaning during exercise session. Flowsheet Row Cardiac Rehab from 07/14/2020 in [facility] Cardiac and Pulmonary Rehab  Date 07/07/20  Educator [facility]  Instruction Review Code 1- Verbalizes Understanding      Falls Prevention: - Provides verbal and written material to individual with discussion of falls prevention and safety. Flowsheet Row Cardiac Rehab from 07/14/2020 in [facility] Cardiac and Pulmonary Rehab  Date 07/07/20  Educator [facility]  Instruction Review Code 1- Verbalizes Understanding      Other: -Provides group and verbal instruction on various topics (see comments)   Knowledge Questionnaire Score:  Knowledge Questionnaire Score - 07/14/20 1159      Knowledge Questionnaire Score   Pre Score 24/26 Education Focus: Angina, Nutrition           Core Components/Risk Factors/Patient Goals at Admission:  Personal Goals and Risk Factors at Admission - 07/14/20 1159      Core Components/Risk Factors/Patient Goals on Admission    Weight Management Yes;Weight Loss;Obesity    Intervention Weight Management: Develop a combined nutrition and exercise program designed to reach desired caloric intake, while maintaining appropriate intake of nutrient and fiber, sodium and fats, and appropriate energy expenditure required for the weight goal.;Weight Management: Provide education and appropriate resources to help participant work on and attain dietary goals.;Weight Management/Obesity: Establish reasonable short term and long term weight goals.;Obesity: Provide education and appropriate resources to help participant work on and attain dietary goals.    Admit Weight 175 lb 6.4 oz (79.6 kg)    Goal Weight: Short Term 170 lb (77.1 kg)    Goal Weight: Long Term 165 lb (74.8 kg)    Expected Outcomes Short Term: Continue to assess and modify interventions until short term weight is achieved;Long Term: Adherence to  nutrition and physical activity/exercise program aimed toward attainment of established weight goal;Weight Loss: Understanding of general recommendations for a balanced deficit meal plan, which promotes 1-2 lb weight loss per week and includes a negative energy balance of [phone] kcal/d;Understanding recommendations for meals to include 15-35% energy as protein, 25-35% energy from fat, 35-60% energy from carbohydrates, less than  of dietary cholesterol, 20-35 gm of total fiber daily;Understanding of distribution of calorie intake throughout the day with the consumption of 4-5 meals/snacks    Hypertension Yes    Intervention Provide education on lifestyle modifcations including regular physical activity/exercise, weight management, moderate sodium restriction and increased consumption of fresh fruit, vegetables, and low fat dairy, alcohol moderation, and smoking cessation.;Monitor prescription use compliance.    Expected Outcomes Short Term: Continued assessment and intervention until BP is < 140/56mm HG in hypertensive participants. < 130/40mm HG in hypertensive participants with diabetes, heart failure or chronic kidney disease.;Long Term: Maintenance  of blood pressure at goal levels.    Lipids Yes    Intervention Provide education and support for participant on nutrition & aerobic/resistive exercise along with prescribed medications to achieve LDL 70mg , HDL >40mg .    Expected Outcomes Short Term: Participant states understanding of desired cholesterol values and is compliant with medications prescribed. Participant is following exercise prescription and nutrition guidelines.;Long Term: Cholesterol controlled with medications as prescribed, with individualized exercise RX and with personalized nutrition plan. Value goals: LDL < , HDL > 40 mg.           Education:Diabetes - Individual verbal and written instruction to review signs/symptoms of diabetes, desired ranges of glucose level fasting,  after meals and with exercise. Acknowledge that pre and post exercise glucose checks will be done for 3 sessions at entry of program.   Core Components/Risk Factors/Patient Goals Review:    Core Components/Risk Factors/Patient Goals at Discharge (Final Review):    ITP Comments:  ITP Comments    Row Name 07/07/20 1007 07/14/20 1150 07/15/20 1107 07/27/20 1121     ITP Comments Virtual Visit completed. Patient informed on EP and RD appointment and 6 Minute walk test. Patient also informed of patient health questionnaires on My Chart. Patient Verbalizes understanding. Visit diagnosis can be found in [facility]. Completed and gym orientation. Initial ITP created and sent for review to Dr. Bethann Punches, Medical Director. First full day of exercise!  Patient was oriented to gym and equipment including functions, settings, policies, and procedures.  Patient's individual exercise prescription and treatment plan were reviewed.  All starting workloads were established based on the results of the 6 minute walk test done at initial orientation visit.  The plan for exercise progression was also introduced and progression will be customized based on patient's performance and goals. 30 Day review completed. Medical Director ITP review done, changes made as directed, and signed approval by Medical Director.  New to program           Comments:

## 2020-08-04 ENCOUNTER — Telehealth: Payer: Self-pay

## 2020-08-04 NOTE — Telephone Encounter (Signed)
Hoping to return Friday 4/1

## 2020-08-08 ENCOUNTER — Encounter: Payer: Medicare Other | Attending: Cardiology | Admitting: *Deleted

## 2020-08-08 ENCOUNTER — Other Ambulatory Visit: Payer: Self-pay

## 2020-08-08 DIAGNOSIS — Z9861 Coronary angioplasty status: Secondary | ICD-10-CM | POA: Insufficient documentation

## 2020-08-08 DIAGNOSIS — Z955 Presence of coronary angioplasty implant and graft: Secondary | ICD-10-CM | POA: Insufficient documentation

## 2020-08-08 NOTE — Progress Notes (Signed)
Daily Session Note  Patient Details  Name: Terra Aveni MRN: [id number] Date of Birth: 24-May-1940 Referring Provider:   Flowsheet Row Cardiac Rehab from 07/14/2020 in [facility] Cardiac and Pulmonary Rehab  Referring Provider Teodoro Kil MD      Encounter Date: 08/08/2020  Check In:  Session Check In - 08/08/20 1102      Check-In   Supervising physician immediately available to respond to emergencies See telemetry face sheet for immediately available ER MD    Location [facility]-Cardiac & Pulmonary Rehab    Staff Present Renita Papa, RN BSN;Joseph [address] [place], MPA, Mauricia Area, BS, ACSM CEP, Exercise Physiologist    Virtual Visit No    Medication changes reported     No    Fall or balance concerns reported    No    Warm-up and Cool-down Performed on first and last piece of equipment    Resistance Training Performed Yes    VAD Patient? No    PAD/SET Patient? No      Pain Assessment   Currently in Pain? No/denies              Social History   Tobacco Use  Smoking Status Never Smoker  Smokeless Tobacco Never Used    Goals Met:  Independence with exercise equipment Exercise tolerated well No report of cardiac concerns or symptoms Strength training completed today  Goals Unmet:  Not Applicable  Comments: Pt able to follow exercise prescription today without complaint.  Will continue to monitor for progression.    Dr. Emily Filbert is Medical Director for [place] and [facility].

## 2020-08-15 ENCOUNTER — Encounter: Payer: Medicare Other | Admitting: *Deleted

## 2020-08-15 ENCOUNTER — Other Ambulatory Visit: Payer: Self-pay

## 2020-08-15 DIAGNOSIS — Z9861 Coronary angioplasty status: Secondary | ICD-10-CM

## 2020-08-15 DIAGNOSIS — Z955 Presence of coronary angioplasty implant and graft: Secondary | ICD-10-CM | POA: Diagnosis not present

## 2020-08-15 NOTE — Progress Notes (Signed)
Daily Session Note  Patient Details  Name: Monica Bush MRN: [id number] Date of Birth: Dec 15, 1940 Referring Provider:   Flowsheet Row Cardiac Rehab from 07/14/2020 in [facility] Cardiac and Pulmonary Rehab  Referring Provider Teodoro Kil MD      Encounter Date: 08/15/2020  Check In:  Session Check In - 08/15/20 1115      Check-In   Supervising physician immediately available to respond to emergencies See telemetry face sheet for immediately available ER MD    Location [facility]-Cardiac & Pulmonary Rehab    Staff Present Renita Papa, RN BSN;Joseph Tedd Sias, [place], ACSM CEP, Exercise Physiologist;Kelly Rosalia Hammers, MPA, RN    Virtual Visit No    Medication changes reported     No    Fall or balance concerns reported    No    Warm-up and Cool-down Performed on first and last piece of equipment    Resistance Training Performed Yes    VAD Patient? No    PAD/SET Patient? No      Pain Assessment   Currently in Pain? No/denies             Exercise Prescription Changes - 08/15/20 1100      Home Exercise Plan   Plans to continue exercise at Home (comment)   walking, staff videos   Frequency Add 2 additional days to program exercise sessions.    Initial Home Exercises Provided 08/15/20           Social History   Tobacco Use  Smoking Status Never Smoker  Smokeless Tobacco Never Used    Goals Met:  Independence with exercise equipment Exercise tolerated well No report of cardiac concerns or symptoms Strength training completed today  Goals Unmet:  Not Applicable  Comments: Pt able to follow exercise prescription today without complaint.  Will continue to monitor for progression.    Dr. Emily Filbert is Medical Director for [facility] and [facility].

## 2020-08-19 ENCOUNTER — Other Ambulatory Visit: Payer: Self-pay

## 2020-08-19 ENCOUNTER — Encounter: Payer: Medicare Other | Admitting: *Deleted

## 2020-08-19 DIAGNOSIS — Z955 Presence of coronary angioplasty implant and graft: Secondary | ICD-10-CM

## 2020-08-19 DIAGNOSIS — Z9861 Coronary angioplasty status: Secondary | ICD-10-CM

## 2020-08-19 NOTE — Progress Notes (Signed)
Daily Session Note  Patient Details  Name: Monica Bush MRN: [id number] Date of Birth: November 27, 1940 Referring Provider:   Flowsheet Row Cardiac Rehab from 07/14/2020 in [facility] Cardiac and Pulmonary Rehab  Referring Provider Teodoro Kil MD      Encounter Date: 08/19/2020  Check In:  Session Check In - 08/19/20 1112      Check-In   Supervising physician immediately available to respond to emergencies See telemetry face sheet for immediately available ER MD    Location [facility]-Cardiac & Pulmonary Rehab    Staff Present Renita Papa, RN BSN;Joseph Hood RCP,RRT,BSRT;Melissa Outlook RDN, LDN    Virtual Visit No    Medication changes reported     No    Fall or balance concerns reported    No    Warm-up and Cool-down Performed on first and last piece of equipment    Resistance Training Performed Yes    VAD Patient? No    PAD/SET Patient? No      Pain Assessment   Currently in Pain? No/denies              Social History   Tobacco Use  Smoking Status Never Smoker  Smokeless Tobacco Never Used    Goals Met:  Independence with exercise equipment Exercise tolerated well No report of cardiac concerns or symptoms Strength training completed today  Goals Unmet:  Not Applicable  Comments: Pt able to follow exercise prescription today without complaint.  Will continue to monitor for progression.    Dr. Emily Filbert is Medical Director for [place] and [facility].

## 2020-08-22 ENCOUNTER — Encounter: Payer: Medicare Other | Admitting: *Deleted

## 2020-08-22 ENCOUNTER — Other Ambulatory Visit: Payer: Self-pay

## 2020-08-22 DIAGNOSIS — Z955 Presence of coronary angioplasty implant and graft: Secondary | ICD-10-CM

## 2020-08-22 DIAGNOSIS — Z9861 Coronary angioplasty status: Secondary | ICD-10-CM

## 2020-08-22 NOTE — Progress Notes (Signed)
Daily Session Note  Patient Details  Name: Monica Bush MRN: [id number] Date of Birth: 09/19/1940 Referring Provider:   Flowsheet Row Cardiac Rehab from 07/14/2020 in [facility] Cardiac and Pulmonary Rehab  Referring Provider Kelsey, Anita MD      Encounter Date: 08/22/2020  Check In:  Session Check In - 08/22/20 1118      Check-In   Supervising physician immediately available to respond to emergencies See telemetry face sheet for immediately available ER MD    Location [facility]-Cardiac & Pulmonary Rehab    Staff Present Meredith Craven, RN BSN;Joseph Hood RCP,RRT,BSRT;Melissa Caiola RDN, LDN;Kelly Hayes, BS, ACSM CEP, Exercise Physiologist    Virtual Visit No    Medication changes reported     No    Fall or balance concerns reported    No    Warm-up and Cool-down Performed on first and last piece of equipment    Resistance Training Performed Yes    VAD Patient? No    PAD/SET Patient? No      Pain Assessment   Currently in Pain? No/denies              Social History   Tobacco Use  Smoking Status Never Smoker  Smokeless Tobacco Never Used    Goals Met:  Independence with exercise equipment Exercise tolerated well No report of cardiac concerns or symptoms Strength training completed today  Goals Unmet:  Not Applicable  Comments: Pt able to follow exercise prescription today without complaint.  Will continue to monitor for progression.    Dr. Mark Miller is Medical Director for [facility] and [facility]. 

## 2020-08-24 ENCOUNTER — Encounter: Payer: Self-pay | Admitting: *Deleted

## 2020-08-24 DIAGNOSIS — Z955 Presence of coronary angioplasty implant and graft: Secondary | ICD-10-CM

## 2020-08-24 NOTE — Progress Notes (Signed)
Cardiac Individual Treatment Plan  Patient Details  Name: Monica Bush MRN: [id number] Date of Birth: 04-25-1941 Referring Provider:   Flowsheet Row Cardiac Rehab from 07/14/2020 in [facility] Cardiac and Pulmonary Rehab  Referring Provider Ciro Backer MD      Initial Encounter Date:  Flowsheet Row Cardiac Rehab from 07/14/2020 in [facility] Cardiac and Pulmonary Rehab  Date 07/14/20      Visit Diagnosis: Status post coronary artery stent placement  Patient's Home Medications on Admission:  Current Outpatient Medications:  .  acetaminophen (TYLENOL) 500 MG tablet, Take by mouth., Disp: , Rfl:  .  amLODipine (NORVASC) 10 MG tablet, Take 10 mg by mouth daily., Disp: , Rfl:  .  amLODipine (NORVASC) 10 MG tablet, Take 1 tablet by mouth daily. (Patient not taking: Reported on 07/07/2020), Disp: , Rfl:  .  ascorbic acid (VITAMIN C) 100 MG tablet, Take by mouth., Disp: , Rfl:  .  aspirin 81 MG chewable tablet, Chew 81 mg by mouth daily., Disp: , Rfl:  .  atorvastatin (LIPITOR) 40 MG tablet, Take 40 mg by mouth daily., Disp: , Rfl:  .  azelastine (ASTELIN) 0.1 % nasal spray, Place into the nose., Disp: , Rfl:  .  BRILINTA 90 MG TABS tablet, , Disp: , Rfl:  .  chlorpheniramine (CHLOR-TRIMETON) 4 MG tablet, Take by mouth., Disp: , Rfl:  .  Cholecalciferol (VITAMIN D3) 25 MCG (1000 UT) CAPS, Take by mouth., Disp: , Rfl:  .  clopidogrel (PLAVIX) 75 MG tablet, Take by mouth., Disp: , Rfl:  .  famotidine (PEPCID) 40 MG tablet, Take by mouth., Disp: , Rfl:  .  fluticasone (FLONASE) 50 MCG/ACT nasal spray, Place into the nose., Disp: , Rfl:  .  Multiple Vitamin (MULTI-VITAMIN) tablet, Take 1 tablet by mouth daily., Disp: , Rfl:  .  nitroGLYCERIN (NITROSTAT) 0.4 MG SL tablet, , Disp: , Rfl:  .  nitroGLYCERIN (NITROSTAT) 0.4 MG SL tablet, Place 1 tablet under the tongue every 5 (five) minutes as needed. (Patient not taking: Reported on 07/07/2020), Disp: , Rfl:  .  nystatin ointment (MYCOSTATIN), Apply topically 2  (two) times daily., Disp: , Rfl:  .  STUDY - ASPIRE - aspirin 81 mg or placebo tablet (PI-Sethi), Take 1 tablet by mouth daily., Disp: , Rfl:   Past Medical History: Past Medical History:  Diagnosis Date  . Anxiety   . Asthma   . CAD (coronary artery disease)   . Hyperlipemia   . Hypertension   . Osteopetrosis   . Reflux esophagitis     Tobacco Use: Social History   Tobacco Use  Smoking Status Never Smoker  Smokeless Tobacco Never Used    Labs: Recent Review Flowsheet Data   There is no flowsheet data to display.      Exercise Target Goals: Exercise Program Goal: Individual exercise prescription set using results from initial 6 min walk test and THRR while considering  patient's activity barriers and safety.   Exercise Prescription Goal: Initial exercise prescription builds to 30-45 minutes a day of aerobic activity, 2-3 days per week.  Home exercise guidelines will be given to patient during program as part of exercise prescription that the participant will acknowledge.   Education: Aerobic Exercise: - Group verbal and visual presentation on the components of exercise prescription. Introduces F.I.T.T principle from ACSM for exercise prescriptions.  Reviews F.I.T.T. principles of aerobic exercise including progression. Written material given at graduation.   Education: Resistance Exercise: - Group verbal and visual presentation on the components  of exercise prescription. Introduces F.I.T.T principle from ACSM for exercise prescriptions  Reviews F.I.T.T. principles of resistance exercise including progression. Written material given at graduation.    Education: Exercise & Equipment Safety: - Individual verbal instruction and demonstration of equipment use and safety with use of the equipment. Flowsheet Row Cardiac Rehab from 07/14/2020 in [facility] Cardiac and Pulmonary Rehab  Date 07/07/20  Educator [facility]  Instruction Review Code 1- Verbalizes Understanding       Education: Exercise Physiology & General Exercise Guidelines: - Group verbal and written instruction with models to review the exercise physiology of the cardiovascular system and associated critical values. Provides general exercise guidelines with specific guidelines to those with heart or lung disease.    Education: Flexibility, Balance, Mind/Body Relaxation: - Group verbal and visual presentation with interactive activity on the components of exercise prescription. Introduces F.I.T.T principle from ACSM for exercise prescriptions. Reviews F.I.T.T. principles of flexibility and balance exercise training including progression. Also discusses the mind body connection.  Reviews various relaxation techniques to help reduce and manage stress (i.e. Deep breathing, progressive muscle relaxation, and visualization). Balance handout provided to take home. Written material given at graduation.   Activity Barriers & Risk Stratification:  Activity Barriers & Cardiac Risk Stratification - 07/14/20 1151      Activity Barriers & Cardiac Risk Stratification   Activity Barriers Deconditioning;Muscular Weakness;Balance Concerns    Cardiac Risk Stratification Moderate           6 Minute Walk:  6 Minute Walk    Row Name 07/14/20 1150         6 Minute Walk   Phase Initial     Distance 1195 feet     Walk Time 6 minutes     # of Rest Breaks 0     MPH 2.26     METS 1.72     RPE 12     Perceived Dyspnea  1     VO2 Peak 6.03     Symptoms Yes (comment)     Comments balance felt off at end     Resting HR 80 bpm     Resting BP 122/80     Resting Oxygen Saturation  96 %     Exercise Oxygen Saturation  during 6 min walk 96 %     Max Ex. HR 100 bpm     Max Ex. BP 126/66     2 Minute Post BP 120/66            Oxygen Initial Assessment:   Oxygen Re-Evaluation:   Oxygen Discharge (Final Oxygen Re-Evaluation):   Initial Exercise Prescription:  Initial Exercise Prescription - 07/14/20  1100      Date of Initial Exercise RX and Referring Provider   Date 07/14/20    Referring Provider Ciro Backer MD      Treadmill   MPH 2    Grade 0.5    Minutes 15    METs 2.75      Recumbant Bike   Level 1    RPM 50    Watts 10    Minutes 15    METs 2      NuStep   Level 1    SPM 80    Minutes 15    METs 2      REL-XR   Level 1    Speed 50    Minutes 15    METs 2      T5 Nustep   Level 1  SPM 80    Minutes 15    METs 2      Prescription Details   Frequency (times per week) 3    Duration Progress to 30 minutes of continuous aerobic without signs/symptoms of physical distress      Intensity   THRR 40-80% of Max Heartrate 105-130    Ratings of Perceived Exertion 11-13    Perceived Dyspnea 0-4      Progression   Progression Continue to progress workloads to maintain intensity without signs/symptoms of physical distress.      Resistance Training   Training Prescription Yes    Weight 3 lb    Reps 10-15           Perform Capillary Blood Glucose checks as needed.  Exercise Prescription Changes:  Exercise Prescription Changes    Row Name 07/14/20 1100 07/19/20 0800 08/15/20 1100         Response to Exercise   Blood Pressure (Admit) 122/80 126/64 102/62     Blood Pressure (Exercise) 126/66 122/62 128/70     Blood Pressure (Exit) 120/66 102/60 106/60     Heart Rate (Admit) 80 bpm 86 bpm 76 bpm     Heart Rate (Exercise) 100 bpm 112 bpm 108 bpm     Heart Rate (Exit) 71 bpm 93 bpm 77 bpm     Oxygen Saturation (Admit) 96 % -- --     Oxygen Saturation (Exercise) 96 % -- --     Rating of Perceived Exertion (Exercise) 12 13 13      Perceived Dyspnea (Exercise) 1 -- --     Symptoms balance off at end none none     Comments walk test results first full day of exercise --     Duration -- Continue with 30 min of aerobic exercise without signs/symptoms of physical distress. Continue with 30 min of aerobic exercise without signs/symptoms of physical  distress.     Intensity -- THRR unchanged THRR unchanged           Progression   Progression -- Continue to progress workloads to maintain intensity without signs/symptoms of physical distress. Continue to progress workloads to maintain intensity without signs/symptoms of physical distress.     Average METs -- 2.6 2.22           Resistance Training   Training Prescription -- Yes Yes     Weight -- 3 lb 3 lb     Reps -- 10-15 10-15           Interval Training   Interval Training -- No No           Treadmill   MPH -- -- 2     Grade -- -- 0.5     Minutes -- -- 15     METs -- -- 2.67           Recumbant Bike   Level -- 3 --     Minutes -- 15 --     METs -- 2.9 --           NuStep   Level -- 3 --     Minutes -- 15 --     METs -- 2.3 --           REL-XR   Level -- -- 5     Minutes -- -- 15     METs -- -- 2.2           T5 Nustep   Level -- --  2     Minutes -- -- 15     METs -- -- 1.8           Home Exercise Plan   Plans to continue exercise at -- -- Home (comment)  walking, staff videos     Frequency -- -- Add 2 additional days to program exercise sessions.     Initial Home Exercises Provided -- -- 08/15/20            Exercise Comments:   Exercise Goals and Review:  Exercise Goals    Row Name 07/14/20 1153             Exercise Goals   Increase Physical Activity Yes       Intervention Provide advice, education, support and counseling about physical activity/exercise needs.;Develop an individualized exercise prescription for aerobic and resistive training based on initial evaluation findings, risk stratification, comorbidities and participant's personal goals.       Expected Outcomes Short Term: Attend rehab on a regular basis to increase amount of physical activity.;Long Term: Add in home exercise to make exercise part of routine and to increase amount of physical activity.;Long Term: Exercising regularly at least 3-5 days a week.       Increase  Strength and Stamina Yes       Intervention Provide advice, education, support and counseling about physical activity/exercise needs.;Develop an individualized exercise prescription for aerobic and resistive training based on initial evaluation findings, risk stratification, comorbidities and participant's personal goals.       Expected Outcomes Short Term: Increase workloads from initial exercise prescription for resistance, speed, and METs.;Short Term: Perform resistance training exercises routinely during rehab and add in resistance training at home;Long Term: Improve cardiorespiratory fitness, muscular endurance and strength as measured by increased METs and functional capacity ( )       Able to understand and use rate of perceived exertion (RPE) scale Yes       Intervention Provide education and explanation on how to use RPE scale       Expected Outcomes Short Term: Able to use RPE daily in rehab to express subjective intensity level;Long Term:  Able to use RPE to guide intensity level when exercising independently       Able to understand and use Dyspnea scale Yes       Intervention Provide education and explanation on how to use Dyspnea scale       Expected Outcomes Long Term: Able to use Dyspnea scale to guide intensity level when exercising independently;Short Term: Able to use Dyspnea scale daily in rehab to express subjective sense of shortness of breath during exertion       Knowledge and understanding of Target Heart Rate Range (THRR) Yes       Intervention Provide education and explanation of THRR including how the numbers were predicted and where they are located for reference       Expected Outcomes Short Term: Able to use daily as guideline for intensity in rehab;Short Term: Able to state/look up THRR;Long Term: Able to use THRR to govern intensity when exercising independently       Able to check pulse independently Yes       Intervention Provide education and demonstration on how  to check pulse in carotid and radial arteries.;Review the importance of being able to check your own pulse for safety during independent exercise       Expected Outcomes Short Term: Able to explain why pulse checking is important during independent  exercise;Long Term: Able to check pulse independently and accurately       Understanding of Exercise Prescription Yes       Intervention Provide education, explanation, and written materials on patient's individual exercise prescription       Expected Outcomes Short Term: Able to explain program exercise prescription;Long Term: Able to explain home exercise prescription to exercise independently              Exercise Goals Re-Evaluation :  Exercise Goals Re-Evaluation    Row Name 07/15/20 1108 07/19/20 0849 08/15/20 1106         Exercise Goal Re-Evaluation   Exercise Goals Review Increase Physical Activity;Able to understand and use rate of perceived exertion (RPE) scale;Knowledge and understanding of Target Heart Rate Range (THRR);Understanding of Exercise Prescription;Increase Strength and Stamina;Able to understand and use Dyspnea scale;Able to check pulse independently Increase Physical Activity;Increase Strength and Stamina;Understanding of Exercise Prescription Increase Physical Activity;Increase Strength and Stamina;Understanding of Exercise Prescription     Comments Reviewed RPE and dyspnea scales, THR and program prescription with pt today.  Pt voiced understanding and was given a copy of goals to take home. Only attended first day so far.  We will continue to monitor Monica Bush is back!  She is feeling pretty good again, finally.  She has not gotten into her exercise at home yet.  Updated home exercise today, Monica Bush plans to walk and use staff vidoes for exercise at home.  Reviewed scales and THR again.     Expected Outcomes Short: Use RPE daily to regulate intensity. Long: Follow program prescription in THR. Short: Attend rehab regularly Long:  contiue to follow program prescription Short: Back into routine exercise Long: Continue to improve stamina.            Discharge Exercise Prescription (Final Exercise Prescription Changes):  Exercise Prescription Changes - 08/15/20 1100      Response to Exercise   Blood Pressure (Admit) 102/62    Blood Pressure (Exercise) 128/70    Blood Pressure (Exit) 106/60    Heart Rate (Admit) 76 bpm    Heart Rate (Exercise) 108 bpm    Heart Rate (Exit) 77 bpm    Rating of Perceived Exertion (Exercise) 13    Symptoms none    Duration Continue with 30 min of aerobic exercise without signs/symptoms of physical distress.    Intensity THRR unchanged      Progression   Progression Continue to progress workloads to maintain intensity without signs/symptoms of physical distress.    Average METs 2.22      Resistance Training   Training Prescription Yes    Weight 3 lb    Reps 10-15      Interval Training   Interval Training No      Treadmill   MPH 2    Grade 0.5    Minutes 15    METs 2.67      REL-XR   Level 5    Minutes 15    METs 2.2      T5 Nustep   Level 2    Minutes 15    METs 1.8      Home Exercise Plan   Plans to continue exercise at Home (comment)   walking, staff videos   Frequency Add 2 additional days to program exercise sessions.    Initial Home Exercises Provided 08/15/20           Nutrition:  Target Goals: Understanding of nutrition guidelines, daily intake of sodium 1500mg ,  cholesterol 200mg , calories 30% from fat and 7% or less from saturated fats, daily to have 5 or more servings of fruits and vegetables.  Education: All About Nutrition: -Group instruction provided by verbal, written material, interactive activities, discussions, models, and posters to present general guidelines for heart healthy nutrition including fat, fiber, MyPlate, the role of sodium in heart healthy nutrition, utilization of the nutrition label, and utilization of this knowledge for  meal planning. Follow up email sent as well. Written material given at graduation. Flowsheet Row Cardiac Rehab from 07/14/2020 in [facility] Cardiac and Pulmonary Rehab  Education need identified 07/14/20      Biometrics:  Pre Biometrics - 07/14/20 1154      Pre Biometrics   Height 5' 1.1" (1.552 m)    Weight 175 lb 6.4 oz (79.6 kg)    BMI (Calculated) 33.03    Single Leg Stand 8.1 seconds            Nutrition Therapy Plan and Nutrition Goals:  Nutrition Therapy & Goals - 07/14/20 1157      Intervention Plan   Intervention Prescribe, educate and counsel regarding individualized specific dietary modifications aiming towards targeted core components such as weight, hypertension, lipid management, diabetes, heart failure and other comorbidities.    Expected Outcomes Short Term Goal: Understand basic principles of dietary content, such as calories, fat, sodium, cholesterol and nutrients.;Short Term Goal: A plan has been developed with personal nutrition goals set during dietitian appointment.;Long Term Goal: Adherence to prescribed nutrition plan.           Nutrition Assessments:  MEDIFICTS Score Key:  ?70 Need to make dietary changes   40-70 Heart Healthy Diet  ? 40 Therapeutic Level Cholesterol Diet  Flowsheet Row Cardiac Rehab from 07/14/2020 in [facility] Cardiac and Pulmonary Rehab  Picture Your Plate Total Score on Admission 62     Picture Your Plate Scores:  <16 Unhealthy dietary pattern with much room for improvement.  41-50 Dietary pattern unlikely to meet recommendations for good health and room for improvement.  51-60 More healthful dietary pattern, with some room for improvement.   >60 Healthy dietary pattern, although there may be some specific behaviors that could be improved.    Nutrition Goals Re-Evaluation:  Nutrition Goals Re-Evaluation    Row Name 08/15/20 1119             Goals   Nutrition Goal Meet with dietician       Comment Monica Bush has just  started to get her appetite back after stomach issues.  She will set up an appointment with dietician.  She is trying to stay away from dairy to help.       Expected Outcome Meet with dietician              Nutrition Goals Discharge (Final Nutrition Goals Re-Evaluation):  Nutrition Goals Re-Evaluation - 08/15/20 1119      Goals   Nutrition Goal Meet with dietician    Comment Monica Bush has just started to get her appetite back after stomach issues.  She will set up an appointment with dietician.  She is trying to stay away from dairy to help.    Expected Outcome Meet with dietician           Psychosocial: Target Goals: Acknowledge presence or absence of significant depression and/or stress, maximize coping skills, provide positive support system. Participant is able to verbalize types and ability to use techniques and skills needed for reducing stress and depression.  Education: Stress, Anxiety, and Depression - Group verbal and visual presentation to define topics covered.  Reviews how body is impacted by stress, anxiety, and depression.  Also discusses healthy ways to reduce stress and to treat/manage anxiety and depression.  Written material given at graduation. Flowsheet Row Cardiac Rehab from 07/10/2018 in [facility] Cardiac and Pulmonary Rehab  Date 07/08/18  Educator [facility]  Instruction Review Code 1- [employer] Understanding      Education: Sleep Hygiene -Provides group verbal and written instruction about how sleep can affect your health.  Define sleep hygiene, discuss sleep cycles and impact of sleep habits. Review good sleep hygiene tips.  Flowsheet Row Cardiac Rehab from 07/10/2018 in [facility] Cardiac and Pulmonary Rehab  Date 06/10/18  Educator [facility]  Instruction Review Code 1- Verbalizes Understanding      Initial Review & Psychosocial Screening:  Initial Psych Review & Screening - 07/07/20 1008      Initial Review   Current issues with None Identified      Family Dynamics   Good  Support System? Yes    Comments Her step daughter is great for support. Her niece is a great support system. She lives alone and has a positive outlook on her health.      Barriers   Psychosocial barriers to participate in program There are no identifiable barriers or psychosocial needs.;The patient should benefit from training in stress management and relaxation.      Screening Interventions   Interventions Encouraged to exercise;Provide feedback about the scores to participant;To provide support and resources with identified psychosocial needs    Expected Outcomes Short Term goal: Utilizing psychosocial counselor, staff and physician to assist with identification of specific Stressors or current issues interfering with healing process. Setting desired goal for each stressor or current issue identified.;Long Term Goal: Stressors or current issues are controlled or eliminated.;Short Term goal: Identification and review with participant of any Quality of Life or Depression concerns found by scoring the questionnaire.;Long Term goal: The participant improves quality of Life and PHQ9 Scores as seen by post scores and/or verbalization of changes           Quality of Life Scores:   Quality of Life - 07/14/20 1157      Quality of Life   Select Quality of Life      Quality of Life Scores   Health/Function Pre 25.77 %    Socioeconomic Pre 30 %    Psych/Spiritual Pre 30 %    Family Pre 30 %    GLOBAL Pre 28.08 %          Scores of 19 and below usually indicate a poorer quality of life in these areas.  A difference of  2-3 points is a clinically meaningful difference.  A difference of 2-3 points in the total score of the Quality of Life Index has been associated with significant improvement in overall quality of life, self-image, physical symptoms, and general health in studies assessing change in quality of life.  PHQ-9: Recent Review Flowsheet Data    Depression screen [facility] 2/9 07/14/2020  01/15/2019 05/29/2018   Decreased Interest 0 0 0   Down, Depressed, Hopeless 0 0 0   PHQ - 2 Score 0 0 0   Altered sleeping 1 1 0   Tired, decreased energy Change in appetite 1 0 1   Feeling bad or failure about yourself  0 0 1   Trouble concentrating 0 0 0   Moving  slowly or fidgety/restless 0 0 0   Suicidal thoughts 0 0 0   PHQ-9 Score Difficult doing work/chores Somewhat difficult Not difficult at all Somewhat difficult     Interpretation of Total Score  Total Score Depression Severity:  1-4 = Minimal depression, 5-9 = Mild depression, 10-14 = Moderate depression, 15-19 = Moderately severe depression, 20-27 = Severe depression   Psychosocial Evaluation and Intervention:  Psychosocial Evaluation - 07/07/20 1010      Psychosocial Evaluation & Interventions   Interventions Encouraged to exercise with the program and follow exercise prescription;Stress management education;Relaxation education    Comments Her step daughter is great for support. Her niece is a great support system. She lives alone and has a positive outlook on her health.    Expected Outcomes Short: Exercise regularly to support mental health and notify staff of any changes. Long: maintain mental health and well being through teaching of rehab or prescribed medications independently.    Continue Psychosocial Services  Follow up required by staff           Psychosocial Re-Evaluation:  Psychosocial Re-Evaluation    Row Name 08/15/20 1113             Psychosocial Re-Evaluation   Current issues with Current Stress Concerns;Current Depression       Comments Monica Bush is back in rehab. She has good days and bad days, but gets through it.  Her boyfriend has been recently diagnosised with Non-Hodgkins lymphoma and undergoing treatment.  He has his PET scan today and follows up with doctor on Wednesdays and also getting his COVID shots.  They will determine course of treatment based off results.  Her meds  make her fatigued so she is sleeping well and naps for about 1-2 hrs during the afternoon.  She is doing the best she can.  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  She has started a new medication for it and is finally starting to feel better.       Expected Outcomes Short: Continue to cope with boyfriend's cancer Long: Continue to focus on positive and taking care of herself.       Interventions Encouraged to attend Cardiac Rehabilitation for the exercise;Stress management education       Continue Psychosocial Services  Follow up required by staff              Psychosocial Discharge (Final Psychosocial Re-Evaluation):  Psychosocial Re-Evaluation - 08/15/20 1113      Psychosocial Re-Evaluation   Current issues with Current Stress Concerns;Current Depression    Comments Monica Bush is back in rehab. She has good days and bad days, but gets through it.  Her boyfriend has been recently diagnosised with Non-Hodgkins lymphoma and undergoing treatment.  He has his PET scan today and follows up with doctor on Wednesdays and also getting his COVID shots.  They will determine course of treatment based off results.  Her meds make her fatigued so she is sleeping well and naps for about 1-2 hrs during the afternoon.  She is doing the best she can.  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  She has started a new medication for it and is finally starting to feel better.    Expected Outcomes Short: Continue to cope with boyfriend's cancer Long: Continue to focus on positive and taking care of herself.    Interventions Encouraged to attend  Cardiac Rehabilitation for the exercise;Stress management education    Continue Psychosocial Services  Follow up required by staff           Vocational Rehabilitation: Provide vocational rehab assistance to qualifying candidates.   Vocational Rehab Evaluation &  Intervention:   Education: Education Goals: Education classes will be provided on a variety of topics geared toward better understanding of heart health and risk factor modification. Participant will state understanding/return demonstration of topics presented as noted by education test scores.  Learning Barriers/Preferences:  Learning Barriers/Preferences - 07/07/20 1007      Learning Barriers/Preferences   Learning Barriers None    Learning Preferences None           General Cardiac Education Topics:  AED/CPR: - Group verbal and written instruction with the use of models to demonstrate the basic use of the AED with the basic ABC's of resuscitation.   Anatomy and Cardiac Procedures: - Group verbal and visual presentation and models provide information about basic cardiac anatomy and function. Reviews the testing methods done to diagnose heart disease and the outcomes of the test results. Describes the treatment choices: Medical Management, Angioplasty, or Coronary Bypass Surgery for treating various heart conditions including Myocardial Infarction, Angina, Valve Disease, and Cardiac Arrhythmias.  Written material given at graduation. Flowsheet Row Cardiac Rehab from 07/14/2020 in [facility] Cardiac and Pulmonary Rehab  Education need identified 07/14/20      Medication Safety: - Group verbal and visual instruction to review commonly prescribed medications for heart and lung disease. Reviews the medication, class of the drug, and side effects. Includes the steps to properly store meds and maintain the prescription regimen.  Written material given at graduation.   Intimacy: - Group verbal instruction through game format to discuss how heart and lung disease can affect sexual intimacy. Written material given at graduation.. Flowsheet Row Cardiac Rehab from 07/10/2018 in [facility] Cardiac and Pulmonary Rehab  Date 07/03/18  Educator [facility]  Instruction Review Code 1- Verbalizes Understanding       Know Your Numbers and Heart Failure: - Group verbal and visual instruction to discuss disease risk factors for cardiac and pulmonary disease and treatment options.  Reviews associated critical values for Overweight/Obesity, Hypertension, Cholesterol, and Diabetes.  Discusses basics of heart failure: signs/symptoms and treatments.  Introduces Heart Failure Zone chart for action plan for heart failure.  Written material given at graduation.   Infection Prevention: - Provides verbal and written material to individual with discussion of infection control including proper hand washing and proper equipment cleaning during exercise session. Flowsheet Row Cardiac Rehab from 07/14/2020 in [facility] Cardiac and Pulmonary Rehab  Date 07/07/20  Educator [facility]  Instruction Review Code 1- Verbalizes Understanding      Falls Prevention: - Provides verbal and written material to individual with discussion of falls prevention and safety. Flowsheet Row Cardiac Rehab from 07/14/2020 in [facility] Cardiac and Pulmonary Rehab  Date 07/07/20  Educator [facility]  Instruction Review Code 1- Verbalizes Understanding      Other: -Provides group and verbal instruction on various topics (see comments)   Knowledge Questionnaire Score:  Knowledge Questionnaire Score - 07/14/20 1159      Knowledge Questionnaire Score   Pre Score 24/26 Education Focus: Angina, Nutrition           Core Components/Risk Factors/Patient Goals at Admission:  Personal Goals and Risk Factors at Admission - 07/14/20 1159      Core Components/Risk Factors/Patient Goals on Admission    Weight Management Yes;Weight  Loss;Obesity    Intervention Weight Management: Develop a combined nutrition and exercise program designed to reach desired caloric intake, while maintaining appropriate intake of nutrient and fiber, sodium and fats, and appropriate energy expenditure required for the weight goal.;Weight Management: Provide education and appropriate  resources to help participant work on and attain dietary goals.;Weight Management/Obesity: Establish reasonable short term and long term weight goals.;Obesity: Provide education and appropriate resources to help participant work on and attain dietary goals.    Admit Weight 175 lb 6.4 oz (79.6 kg)    Goal Weight: Short Term 170 lb (77.1 kg)    Goal Weight: Long Term 165 lb (74.8 kg)    Expected Outcomes Short Term: Continue to assess and modify interventions until short term weight is achieved;Long Term: Adherence to nutrition and physical activity/exercise program aimed toward attainment of established weight goal;Weight Loss: Understanding of general recommendations for a balanced deficit meal plan, which promotes 1-2 lb weight loss per week and includes a negative energy balance of [phone] kcal/d;Understanding recommendations for meals to include 15-35% energy as protein, 25-35% energy from fat, 35-60% energy from carbohydrates, less than  of dietary cholesterol, 20-35 gm of total fiber daily;Understanding of distribution of calorie intake throughout the day with the consumption of 4-5 meals/snacks    Hypertension Yes    Intervention Provide education on lifestyle modifcations including regular physical activity/exercise, weight management, moderate sodium restriction and increased consumption of fresh fruit, vegetables, and low fat dairy, alcohol moderation, and smoking cessation.;Monitor prescription use compliance.    Expected Outcomes Short Term: Continued assessment and intervention until BP is < 140/61mm HG in hypertensive participants. < 130/52mm HG in hypertensive participants with diabetes, heart failure or chronic kidney disease.;Long Term: Maintenance of blood pressure at goal levels.    Lipids Yes    Intervention Provide education and support for participant on nutrition & aerobic/resistive exercise along with prescribed medications to achieve LDL 70mg , HDL >40mg .    Expected Outcomes  Short Term: Participant states understanding of desired cholesterol values and is compliant with medications prescribed. Participant is following exercise prescription and nutrition guidelines.;Long Term: Cholesterol controlled with medications as prescribed, with individualized exercise RX and with personalized nutrition plan. Value goals: LDL < , HDL > 40 mg.           Education:Diabetes - Individual verbal and written instruction to review signs/symptoms of diabetes, desired ranges of glucose level fasting, after meals and with exercise. Acknowledge that pre and post exercise glucose checks will be done for 3 sessions at entry of program.   Core Components/Risk Factors/Patient Goals Review:   Goals and Risk Factor Review    Row Name 08/15/20 1122             Core Components/Risk Factors/Patient Goals Review   Personal Goals Review Weight Management/Obesity;Hypertension       Review Leanor has started a new medication for her stomach.  She is starting to feel better now.  She did lose some weight while not feeling well, but she is also eating better now with more fruit so it is staying down.  Her blood pressures have been doing okay and she checks them at home.              Core Components/Risk Factors/Patient Goals at Discharge (Final Review):   Goals and Risk Factor Review - 08/15/20 1122      Core Components/Risk Factors/Patient Goals Review   Personal Goals Review Weight Management/Obesity;Hypertension    Review Monica Bush has started  a new medication for her stomach.  She is starting to feel better now.  She did lose some weight while not feeling well, but she is also eating better now with more fruit so it is staying down.  Her blood pressures have been doing okay and she checks them at home.           ITP Comments:  ITP Comments    Row Name 07/07/20 1007 07/14/20 1150 07/15/20 1107 07/27/20 1121 08/24/20 [id number]   ITP Comments Virtual Visit completed. Patient informed on  EP and RD appointment and 6 Minute walk test. Patient also informed of patient health questionnaires on My Chart. Patient Verbalizes understanding. [facility]. Completed 6MWT and gym orientation. Initial ITP created and sent for review to Dr. Bethann PunchesMark Miller, Medical Director. First full day of exercise!  Patient was oriented to gym and equipment including functions, settings, policies, and procedures.  Patient's individual exercise prescription and treatment plan were reviewed.  All starting workloads were established based on the results of the 6 minute walk test done at initial orientation visit.  The plan for exercise progression was also introduced and progression will be customized based on patient's performance and goals. 30 Day review completed. Medical Director ITP review done, changes made as directed, and signed approval by Medical Director.  New to program 30 Day review completed. Medical Director ITP review done, changes made as directed, and signed approval by Medical Director. one visit in April          Comments:

## 2020-08-31 ENCOUNTER — Other Ambulatory Visit: Payer: Self-pay

## 2020-08-31 DIAGNOSIS — Z9861 Coronary angioplasty status: Secondary | ICD-10-CM

## 2020-08-31 DIAGNOSIS — Z955 Presence of coronary angioplasty implant and graft: Secondary | ICD-10-CM | POA: Diagnosis not present

## 2020-08-31 NOTE — Progress Notes (Signed)
Daily Session Note  Patient Details  Name: Monica Bush MRN: [id number] Date of Birth: 1940/10/30 Referring Provider:   Flowsheet Row Cardiac Rehab from 07/14/2020 in [facility] Cardiac and Pulmonary Rehab  Referring Provider Teodoro Kil MD      Encounter Date: 08/31/2020  Check In:  Session Check In - 08/31/20 1123      Check-In   Supervising physician immediately available to respond to emergencies See telemetry face sheet for immediately available ER MD    Location [facility]-Cardiac & Pulmonary Rehab    Staff Present Birdie Sons, MPA, RN;Melissa Caiola RDN, Rowe Pavy, BA, ACSM CEP, Exercise Physiologist;Joseph Tessie Fass RCP,RRT,BSRT    Virtual Visit No    Medication changes reported     No    Fall or balance concerns reported    No    Tobacco Cessation No Change    Warm-up and Cool-down Performed on first and last piece of equipment    Resistance Training Performed Yes    VAD Patient? No    PAD/SET Patient? No      Pain Assessment   Currently in Pain? No/denies              Social History   Tobacco Use  Smoking Status Never Smoker  Smokeless Tobacco Never Used    Goals Met:  Independence with exercise equipment Exercise tolerated well No report of cardiac concerns or symptoms Strength training completed today  Goals Unmet:  Not Applicable  Comments: Pt able to follow exercise prescription today without complaint.  Will continue to monitor for progression.    Dr. Emily Filbert is Medical Director for [place] and [facility].

## 2020-09-02 ENCOUNTER — Other Ambulatory Visit: Payer: Self-pay

## 2020-09-02 DIAGNOSIS — Z955 Presence of coronary angioplasty implant and graft: Secondary | ICD-10-CM | POA: Diagnosis not present

## 2020-09-02 NOTE — Progress Notes (Signed)
Daily Session Note  Patient Details  Name: Monica Bush MRN: [id number] Date of Birth: [birth year] Referring Provider:   Flowsheet Row Cardiac Rehab from 07/14/2020 in [facility] Cardiac and Pulmonary Rehab  Referring Provider Teodoro Kil MD      Encounter Date: 09/02/2020  Check In:  Session Check In - 09/02/20 1202      Check-In   Supervising physician immediately available to respond to emergencies See telemetry face sheet for immediately available ER MD    Location [facility]-Cardiac & Pulmonary Rehab    Staff Present Basilia Jumbo, RN, BSN;Meredith Sherryll Burger, RN BSN;Joseph [address] [place], [place], [place], CCRP, CCET    Virtual Visit No    Medication changes reported     No    Fall or balance concerns reported    No    Warm-up and Cool-down Performed on first and last piece of equipment    Resistance Training Performed Yes    VAD Patient? No    PAD/SET Patient? No      Pain Assessment   Currently in Pain? No/denies              Social History   Tobacco Use  Smoking Status Never Smoker  Smokeless Tobacco Never Used    Goals Met:  Independence with exercise equipment Exercise tolerated well No report of cardiac concerns or symptoms  Goals Unmet:  Not Applicable  Comments: Pt able to follow exercise prescription today without complaint.  Will continue to monitor for progression.    Dr. Emily Filbert is Medical Director for [place] and [facility].

## 2020-09-05 ENCOUNTER — Other Ambulatory Visit: Payer: Self-pay

## 2020-09-05 ENCOUNTER — Encounter: Payer: Medicare Other | Attending: Cardiology

## 2020-09-05 DIAGNOSIS — Z955 Presence of coronary angioplasty implant and graft: Secondary | ICD-10-CM | POA: Diagnosis present

## 2020-09-05 DIAGNOSIS — Z9861 Coronary angioplasty status: Secondary | ICD-10-CM | POA: Insufficient documentation

## 2020-09-05 NOTE — Progress Notes (Signed)
Daily Session Note  Patient Details  Name: Manha Amato MRN: [id number] Date of Birth: 1940/07/31 Referring Provider:   Flowsheet Row Cardiac Rehab from 07/14/2020 in [facility] Cardiac and Pulmonary Rehab  Referring Provider Teodoro Kil MD      Encounter Date: 09/05/2020  Check In:  Session Check In - 09/05/20 1127      Check-In   Supervising physician immediately available to respond to emergencies See telemetry face sheet for immediately available ER MD    Location [facility]-Cardiac & Pulmonary Rehab    Staff Present Nada Maclachlan, BA, ACSM CEP, Exercise Physiologist;Hadassa Cermak RN, Moises Blood, BS, ACSM CEP, Exercise Physiologist;Joseph Karie Fetch, MPA, RN    Virtual Visit No    Medication changes reported     No    Fall or balance concerns reported    No    Tobacco Cessation No Change    Warm-up and Cool-down Performed on first and last piece of equipment    Resistance Training Performed Yes    VAD Patient? No    PAD/SET Patient? No      Pain Assessment   Currently in Pain? No/denies              Social History   Tobacco Use  Smoking Status Never Smoker  Smokeless Tobacco Never Used    Goals Met:  Proper associated with RPD/PD & O2 Sat Independence with exercise equipment Exercise tolerated well No report of cardiac concerns or symptoms Strength training completed today  Goals Unmet:  Not Applicable  Comments: Pt able to follow exercise prescription today without complaint.  Will continue to monitor for progression.   Dr. Emily Filbert is Medical Director for [place] and [facility].

## 2020-09-15 DIAGNOSIS — Z955 Presence of coronary angioplasty implant and graft: Secondary | ICD-10-CM

## 2020-09-19 ENCOUNTER — Encounter: Payer: Self-pay | Admitting: *Deleted

## 2020-09-19 DIAGNOSIS — Z955 Presence of coronary angioplasty implant and graft: Secondary | ICD-10-CM

## 2020-09-21 ENCOUNTER — Encounter: Payer: Self-pay | Admitting: *Deleted

## 2020-09-21 DIAGNOSIS — Z9861 Coronary angioplasty status: Secondary | ICD-10-CM

## 2020-09-21 DIAGNOSIS — Z955 Presence of coronary angioplasty implant and graft: Secondary | ICD-10-CM

## 2020-09-21 NOTE — Progress Notes (Signed)
Cardiac Individual Treatment Plan  Patient Details  Name: Monica Bush MRN: [id number] Date of Birth: 01/06/1941 Referring Provider:   Flowsheet Row Cardiac Rehab from 07/14/2020 in [facility]  Referring Provider Kelsey, Anita MD      Initial Encounter Date:  Flowsheet Row Cardiac Rehab from 07/14/2020 in [facility]  Date 07/14/20      Visit Diagnosis: Status post coronary artery stent placement  S/P PTCA (percutaneous transluminal coronary angioplasty)  Patient's Home Medications on Admission:  Current Outpatient Medications:  .  acetaminophen (TYLENOL) 500 MG tablet, Take by mouth., Disp: , Rfl:  .  amLODipine (NORVASC) 10 MG tablet, Take 10 mg by mouth daily., Disp: , Rfl:  .  amLODipine (NORVASC) 10 MG tablet, Take 1 tablet by mouth daily. (Patient not taking: Reported on 07/07/2020), Disp: , Rfl:  .  ascorbic acid (VITAMIN C) 100 MG tablet, Take by mouth., Disp: , Rfl:  .  aspirin 81 MG chewable tablet, Chew 81 mg by mouth daily., Disp: , Rfl:  .  atorvastatin (LIPITOR) 40 MG tablet, Take 40 mg by mouth daily., Disp: , Rfl:  .  azelastine (ASTELIN) 0.1 % nasal spray, Place into the nose., Disp: , Rfl:  .  BRILINTA 90 MG TABS tablet, , Disp: , Rfl:  .  chlorpheniramine (CHLOR-TRIMETON) 4 MG tablet, Take by mouth., Disp: , Rfl:  .  Cholecalciferol (VITAMIN D3) 25 MCG (1000 UT) CAPS, Take by mouth., Disp: , Rfl:  .  clopidogrel (PLAVIX) 75 MG tablet, Take by mouth., Disp: , Rfl:  .  famotidine (PEPCID) 40 MG tablet, Take by mouth., Disp: , Rfl:  .  fluticasone (FLONASE) 50 MCG/ACT nasal spray, Place into the nose., Disp: , Rfl:  .  Multiple Vitamin (MULTI-VITAMIN) tablet, Take 1 tablet by mouth daily., Disp: , Rfl:  .  nitroGLYCERIN (NITROSTAT) 0.4 MG SL tablet, , Disp: , Rfl:  .  nitroGLYCERIN (NITROSTAT) 0.4 MG SL tablet, Place 1 tablet under the tongue every 5 (five) minutes as needed. (Patient not taking: Reported on 07/07/2020), Disp:  , Rfl:  .  nystatin ointment (MYCOSTATIN), Apply topically 2 (two) times daily., Disp: , Rfl:  .  STUDY - ASPIRE - aspirin 81 mg or placebo tablet (PI-Sethi), Take 1 tablet by mouth daily., Disp: , Rfl:   Past Medical History: Past Medical History:  Diagnosis Date  . Anxiety   . Asthma   . CAD (coronary artery disease)   . Hyperlipemia   . Hypertension   . Osteopetrosis   . Reflux esophagitis     Tobacco Use: Social History   Tobacco Use  Smoking Status Never Smoker  Smokeless Tobacco Never Used    Labs: Recent Review Flowsheet Data   There is no flowsheet data to display.      Exercise Target Goals: Exercise Program Goal: Individual exercise prescription set using results from initial 6 min walk test and THRR while considering  patient's activity barriers and safety.   Exercise Prescription Goal: Initial exercise prescription builds to 30-45 minutes a day of aerobic activity, 2-3 days per week.  Home exercise guidelines will be given to patient during program as part of exercise prescription that the participant will acknowledge.   Education: Aerobic Exercise: - Group verbal and visual presentation on the components of exercise prescription. Introduces F.I.T.T principle from ACSM for exercise prescriptions.  Reviews F.I.T.T. principles of aerobic exercise including progression. Written material given at graduation.   Education: Resistance Exercise: - Group   verbal and visual presentation on the components of exercise prescription. Introduces F.I.T.T principle from ACSM for exercise prescriptions  Reviews F.I.T.T. principles of resistance exercise including progression. Written material given at graduation.    Education: Exercise & Equipment Safety: - Individual verbal instruction and demonstration of equipment use and safety with use of the equipment. Flowsheet Row Cardiac Rehab from 07/14/2020 in [facility]  Date 07/07/20  Educator JH   Instruction Review Code 1- Verbalizes Understanding      Education: Exercise Physiology & General Exercise Guidelines: - Group verbal and written instruction with models to review the exercise physiology of the cardiovascular system and associated critical values. Provides general exercise guidelines with specific guidelines to those with heart or lung disease.    Education: Flexibility, Balance, Mind/Body Relaxation: - Group verbal and visual presentation with interactive activity on the components of exercise prescription. Introduces F.I.T.T principle from ACSM for exercise prescriptions. Reviews F.I.T.T. principles of flexibility and balance exercise training including progression. Also discusses the mind body connection.  Reviews various relaxation techniques to help reduce and manage stress (i.e. Deep breathing, progressive muscle relaxation, and visualization). Balance handout provided to take home. Written material given at graduation.   Activity Barriers & Risk Stratification:  Activity Barriers & Cardiac Risk Stratification - 07/14/20 1151      Activity Barriers & Cardiac Risk Stratification   Activity Barriers Deconditioning;Muscular Weakness;Balance Concerns    Cardiac Risk Stratification Moderate           6 Minute Walk:  6 Minute Walk    Row Name 07/14/20 1150         6 Minute Walk   Phase Initial     Distance 1195 feet     Walk Time 6 minutes     # of Rest Breaks 0     MPH 2.26     METS 1.72     RPE 12     Perceived Dyspnea  1     VO2 Peak 6.03     Symptoms Yes (comment)     Comments balance felt off at end     Resting HR 80 bpm     Resting BP 122/80     Resting Oxygen Saturation  96 %     Exercise Oxygen Saturation  during 6 min walk 96 %     Max Ex. HR 100 bpm     Max Ex. BP 126/66     2 Minute Post BP 120/66            Oxygen Initial Assessment:   Oxygen Re-Evaluation:   Oxygen Discharge (Final Oxygen Re-Evaluation):   Initial Exercise  Prescription:  Initial Exercise Prescription - 07/14/20 1100      Date of Initial Exercise RX and Referring Provider   Date 07/14/20    Referring Provider Kelsey, Anita MD      Treadmill   MPH 2    Grade 0.5    Minutes 15    METs 2.75      Recumbant Bike   Level 1    RPM 50    Watts 10    Minutes 15    METs 2      NuStep   Level 1    SPM 80    Minutes 15    METs 2      REL-XR   Level 1    Speed 50    Minutes 15    METs 2        T5 Nustep   Level 1    SPM 80    Minutes 15    METs 2      Prescription Details   Frequency (times per week) 3    Duration Progress to 30 minutes of continuous aerobic without signs/symptoms of physical distress      Intensity   THRR 40-80% of Max Heartrate 105-130    Ratings of Perceived Exertion 11-13    Perceived Dyspnea 0-4      Progression   Progression Continue to progress workloads to maintain intensity without signs/symptoms of physical distress.      Resistance Training   Training Prescription Yes    Weight 3 lb    Reps 10-15           Perform Capillary Blood Glucose checks as needed.  Exercise Prescription Changes:  Exercise Prescription Changes    Row Name 07/14/20 1100 07/19/20 0800 08/15/20 1100 08/29/20 1300 09/13/20 1400     Response to Exercise   Blood Pressure (Admit) 122/80 126/64 102/62 112/62 102/56   Blood Pressure (Exercise) 126/66 122/62 128/70 122/60 138/62   Blood Pressure (Exit) 120/66 102/60 106/60 110/60 110/62   Heart Rate (Admit) 80 bpm 86 bpm 76 bpm 81 bpm 85 bpm   Heart Rate (Exercise) 100 bpm 112 bpm 108 bpm 111 bpm 103 bpm   Heart Rate (Exit) 71 bpm 93 bpm 77 bpm 84 bpm 76 bpm   Oxygen Saturation (Admit) 96 % -- -- -- --   Oxygen Saturation (Exercise) 96 % -- -- -- --   Rating of Perceived Exertion (Exercise) _0 Perceived Dyspnea (Exercise) 1 -- -- -- --   Symptoms balance off at end none none none none   Comments walk test results first full day of exercise -- -- --    Duration -- Continue with 30 min of aerobic exercise without signs/symptoms of physical distress. Continue with 30 min of aerobic exercise without signs/symptoms of physical distress. Continue with 30 min of aerobic exercise without signs/symptoms of physical distress. Continue with 30 min of aerobic exercise without signs/symptoms of physical distress.   Intensity -- THRR unchanged THRR unchanged THRR unchanged THRR unchanged     Progression   Progression -- Continue to progress workloads to maintain intensity without signs/symptoms of physical distress. Continue to progress workloads to maintain intensity without signs/symptoms of physical distress. Continue to progress workloads to maintain intensity without signs/symptoms of physical distress. Continue to progress workloads to maintain intensity without signs/symptoms of physical distress.   Average METs -- 2.6 2.22 2.75 1.8     Resistance Training   Training Prescription -- Yes Yes Yes Yes   Weight -- 3 lb 3 lb 3 lb 3 lb   Reps -- 10-15 10-15 10-15 10-15     Interval Training   Interval Training -- No No No No     Treadmill   MPH -- -- 2 2 --   Grade -- -- 0.5 0.5 --   Minutes -- -- 15 15 --   METs -- -- 2.67 2.67 --     Recumbant Bike   Level -- 3 -- 3 2   Minutes -- 15 -- 15 15   METs -- 2.9 -- 2.8 --     NuStep   Level -- 3 -- -- 2   Minutes -- 15 -- -- 15   METs -- 2.3 -- -- 1.8     REL-XR   Level -- --  5 -- --   Minutes -- -- 15 -- --   METs -- -- 2.2 -- --     T5 Nustep   Level -- -- 2 -- --   Minutes -- -- 15 -- --   METs -- -- 1.8 -- --     Home Exercise Plan   Plans to continue exercise at -- -- Home (comment)  walking, staff videos Home (comment)  walking, staff videos Home (comment)  walking, staff videos   Frequency -- -- Add 2 additional days to program exercise sessions. Add 2 additional days to program exercise sessions. Add 2 additional days to program exercise sessions.   Initial Home Exercises  Provided -- -- 08/15/20 08/15/20 08/15/20          Exercise Comments:   Exercise Goals and Review:  Exercise Goals    Row Name 07/14/20 1153             Exercise Goals   Increase Physical Activity Yes       Intervention Provide advice, education, support and counseling about physical activity/exercise needs.;Develop an individualized exercise prescription for aerobic and resistive training based on initial evaluation findings, risk stratification, comorbidities and participant's personal goals.       Expected Outcomes Short Term: Attend rehab on a regular basis to increase amount of physical activity.;Long Term: Add in home exercise to make exercise part of routine and to increase amount of physical activity.;Long Term: Exercising regularly at least 3-5 days a week.       Increase Strength and Stamina Yes       Intervention Provide advice, education, support and counseling about physical activity/exercise needs.;Develop an individualized exercise prescription for aerobic and resistive training based on initial evaluation findings, risk stratification, comorbidities and participant's personal goals.       Expected Outcomes Short Term: Increase workloads from initial exercise prescription for resistance, speed, and METs.;Short Term: Perform resistance training exercises routinely during rehab and add in resistance training at home;Long Term: Improve cardiorespiratory fitness, muscular endurance and strength as measured by increased METs and functional capacity (6MWT)       Able to understand and use rate of perceived exertion (RPE) scale Yes       Intervention Provide education and explanation on how to use RPE scale       Expected Outcomes Short Term: Able to use RPE daily in rehab to express subjective intensity level;Long Term:  Able to use RPE to guide intensity level when exercising independently       Able to understand and use Dyspnea scale Yes       Intervention Provide education  and explanation on how to use Dyspnea scale       Expected Outcomes Long Term: Able to use Dyspnea scale to guide intensity level when exercising independently;Short Term: Able to use Dyspnea scale daily in rehab to express subjective sense of shortness of breath during exertion       Knowledge and understanding of Target Heart Rate Range (THRR) Yes       Intervention Provide education and explanation of THRR including how the numbers were predicted and where they are located for reference       Expected Outcomes Short Term: Able to use daily as guideline for intensity in rehab;Short Term: Able to state/look up THRR;Long Term: Able to use THRR to govern intensity when exercising independently       Able to check pulse independently Yes  Intervention Provide education and demonstration on how to check pulse in carotid and radial arteries.;Review the importance of being able to check your own pulse for safety during independent exercise       Expected Outcomes Short Term: Able to explain why pulse checking is important during independent exercise;Long Term: Able to check pulse independently and accurately       Understanding of Exercise Prescription Yes       Intervention Provide education, explanation, and written materials on patient's individual exercise prescription       Expected Outcomes Short Term: Able to explain program exercise prescription;Long Term: Able to explain home exercise prescription to exercise independently              Exercise Goals Re-Evaluation :  Exercise Goals Re-Evaluation    Row Name 07/15/20 1108 07/19/20 0849 08/15/20 1106 08/29/20 1340 09/13/20 1359     Exercise Goal Re-Evaluation   Exercise Goals Review Increase Physical Activity;Able to understand and use rate of perceived exertion (RPE) scale;Knowledge and understanding of Target Heart Rate Range (THRR);Understanding of Exercise Prescription;Increase Strength and Stamina;Able to understand and use Dyspnea  scale;Able to check pulse independently Increase Physical Activity;Increase Strength and Stamina;Understanding of Exercise Prescription Increase Physical Activity;Increase Strength and Stamina;Understanding of Exercise Prescription Increase Physical Activity;Increase Strength and Stamina Increase Physical Activity;Increase Strength and Stamina;Understanding of Exercise Prescription   Comments Reviewed RPE and dyspnea scales, THR and program prescription with pt today.  Pt voiced understanding and was given a copy of goals to take home. Only attended first day so far.  We will continue to monitor Andreya is back!  She is feeling pretty good again, finally.  She has not gotten into her exercise at home yet.  Updated home exercise today, Caylea plans to walk and use staff vidoes for exercise at home.  Reviewed scales and THR again. Jacoya has been attending once per week since she returned.  Consistent attendance will yield better results. Kenesha continues to struggle with attendance.  Last week her stomach flared up again and this week she had an appointment.  We will continue to encourage improved attendance.  She is on level 2 on the bike.  We will continue to monitor her progress.   Expected Outcomes Short: Use RPE daily to regulate intensity. Long: Follow program prescription in THR. Short: Attend rehab regularly Long: contiue to follow program prescription Short: Back into routine exercise Long: Continue to improve stamina. Short:  attend consistently Long:  improve stamina and MET level Short: Consistent attendance Long: Continue to improve stamina          Discharge Exercise Prescription (Final Exercise Prescription Changes):  Exercise Prescription Changes - 09/13/20 1400      Response to Exercise   Blood Pressure (Admit) 102/56    Blood Pressure (Exercise) 138/62    Blood Pressure (Exit) 110/62    Heart Rate (Admit) 85 bpm    Heart Rate (Exercise) 103 bpm    Heart Rate (Exit) 76 bpm    Rating of  Perceived Exertion (Exercise) 12    Symptoms none    Duration Continue with 30 min of aerobic exercise without signs/symptoms of physical distress.    Intensity THRR unchanged      Progression   Progression Continue to progress workloads to maintain intensity without signs/symptoms of physical distress.    Average METs 1.8      Resistance Training   Training Prescription Yes    Weight 3 lb    Reps 10-15  Interval Training   Interval Training No      Recumbant Bike   Level 2    Minutes 15      NuStep   Level 2    Minutes 15    METs 1.8      Home Exercise Plan   Plans to continue exercise at Home (comment)   walking, staff videos   Frequency Add 2 additional days to program exercise sessions.    Initial Home Exercises Provided 08/15/20           Nutrition:  Target Goals: Understanding of nutrition guidelines, daily intake of sodium <1500mg, cholesterol <200mg, calories 30% from fat and 7% or less from saturated fats, daily to have 5 or more servings of fruits and vegetables.  Education: All About Nutrition: -Group instruction provided by verbal, written material, interactive activities, discussions, models, and posters to present general guidelines for heart healthy nutrition including fat, fiber, MyPlate, the role of sodium in heart healthy nutrition, utilization of the nutrition label, and utilization of this knowledge for meal planning. Follow up email sent as well. Written material given at graduation. Flowsheet Row Cardiac Rehab from 07/14/2020 in [facility]  Education need identified 07/14/20      Biometrics:  Pre Biometrics - 07/14/20 1154      Pre Biometrics   Height 5' 1.1" (1.552 m)    Weight 175 lb 6.4 oz (79.6 kg)    BMI (Calculated) 33.03    Single Leg Stand 8.1 seconds            Nutrition Therapy Plan and Nutrition Goals:  Nutrition Therapy & Goals - 09/05/20 1154      Nutrition Therapy   Diet Heart healthy, low Na     Drug/Food Interactions Statins/Certain Fruits    Protein (specify units) 65g    Fiber 25 grams    Whole Grain Foods 3 servings    Saturated Fats 12 max. grams    Fruits and Vegetables 5 servings/day    Sodium 1.5 grams      Personal Nutrition Goals   Nutrition Goal ST: Continue to introduce foods slowly LT: figure out what she can or cannot tolerate    Comments She got food poisoning back in March - anti nausea medications and pepcid now since then. She can't eat fried food, she has not eaten cheese, dairy gives her problems, she has not tried salad again - she has had lettuce and tomato on a sandwich she is hesitent to try yogurt- she has IBS. She is back to eating more fruit (variety). Cooked vegetables are ok - onions are giving her issues. She is taking a probiotic - daily in coffee. She is still trying to limit her sodium and saturated fat,she also continues to eat fruits, vegetables, beans, and lean protein. She will make chicken noodle soup and sauce from scratch with no salt. She also makes scrambled eggs with english muffin and chives in am which she tolerates well. Reviewed heart healthy eating.  Pt should continue slowly introducing foods as she is.           Nutrition Assessments:  MEDIFICTS Score Key:  ?70 Need to make dietary changes   40-70 Heart Healthy Diet  ? 40 Therapeutic Level Cholesterol Diet  Flowsheet Row Cardiac Rehab from 07/14/2020 in [facility]  Picture Your Plate Total Score on Admission 62     Picture Your Plate Scores:  <40 Unhealthy dietary pattern with much   room for improvement.  41-50 Dietary pattern unlikely to meet recommendations for good health and room for improvement.  51-60 More healthful dietary pattern, with some room for improvement.   >60 Healthy dietary pattern, although there may be some specific behaviors that could be improved.    Nutrition Goals Re-Evaluation:  Nutrition Goals Re-Evaluation    Row  Name 08/15/20 1119             Goals   Nutrition Goal Meet with dietician       Comment Byanca has just started to get her appetite back after stomach issues.  She will set up an appointment with dietician.  She is trying to stay away from dairy to help.       Expected Outcome Meet with dietician              Nutrition Goals Discharge (Final Nutrition Goals Re-Evaluation):  Nutrition Goals Re-Evaluation - 08/15/20 1119      Goals   Nutrition Goal Meet with dietician    Comment Garrie has just started to get her appetite back after stomach issues.  She will set up an appointment with dietician.  She is trying to stay away from dairy to help.    Expected Outcome Meet with dietician           Psychosocial: Target Goals: Acknowledge presence or absence of significant depression and/or stress, maximize coping skills, provide positive support system. Participant is able to verbalize types and ability to use techniques and skills needed for reducing stress and depression.   Education: Stress, Anxiety, and Depression - Group verbal and visual presentation to define topics covered.  Reviews how body is impacted by stress, anxiety, and depression.  Also discusses healthy ways to reduce stress and to treat/manage anxiety and depression.  Written material given at graduation. Flowsheet Row Cardiac Rehab from 07/10/2018 in [facility]  Date 07/08/18  Educator KC  Instruction Review Code 1- Verbalizes Understanding      Education: Sleep Hygiene -Provides group verbal and written instruction about how sleep can affect your health.  Define sleep hygiene, discuss sleep cycles and impact of sleep habits. Review good sleep hygiene tips.  Flowsheet Row Cardiac Rehab from 07/10/2018 in [facility]  Date 06/10/18  Educator KC  Instruction Review Code 1- Verbalizes Understanding      Initial Review & Psychosocial Screening:  Initial Psych Review &  Screening - 07/07/20 1008      Initial Review   Current issues with None Identified      Family Dynamics   Good Support System? Yes    Comments Her step daughter is great for support. Her niece is a great support system. She lives alone and has a positive outlook on her health.      Barriers   Psychosocial barriers to participate in program There are no identifiable barriers or psychosocial needs.;The patient should benefit from training in stress management and relaxation.      Screening Interventions   Interventions Encouraged to exercise;Provide feedback about the scores to participant;To provide support and resources with identified psychosocial needs    Expected Outcomes Short Term goal: Utilizing psychosocial counselor, staff and physician to assist with identification of specific Stressors or current issues interfering with healing process. Setting desired goal for each stressor or current issue identified.;Long Term Goal: Stressors or current issues are controlled or eliminated.;Short Term goal: Identification and review with participant of any Quality of Life or Depression   concerns found by scoring the questionnaire.;Long Term goal: The participant improves quality of Life and PHQ9 Scores as seen by post scores and/or verbalization of changes           Quality of Life Scores:   Quality of Life - 07/14/20 1157      Quality of Life   Select Quality of Life      Quality of Life Scores   Health/Function Pre 25.77 %    Socioeconomic Pre 30 %    Psych/Spiritual Pre 30 %    Family Pre 30 %    GLOBAL Pre 28.08 %          Scores of 19 and below usually indicate a poorer quality of life in these areas.  A difference of  2-3 points is a clinically meaningful difference.  A difference of 2-3 points in the total score of the Quality of Life Index has been associated with significant improvement in overall quality of life, self-image, physical symptoms, and general health in studies  assessing change in quality of life.  PHQ-9: Recent Review Flowsheet Data    Depression screen PHQ 2/9 07/14/2020 01/15/2019 05/29/2018   Decreased Interest 0 0 0   Down, Depressed, Hopeless 0 0 0   PHQ - 2 Score 0 0 0   Altered sleeping 1 1 0   Tired, decreased energy 1 1 1   Change in appetite 1 0 1   Feeling bad or failure about yourself  0 0 1   Trouble concentrating 0 0 0   Moving slowly or fidgety/restless 0 0 0   Suicidal thoughts 0 0 0   PHQ-9 Score 3 2 3   Difficult doing work/chores Somewhat difficult Not difficult at all Somewhat difficult     Interpretation of Total Score  Total Score Depression Severity:  1-4 = Minimal depression, 5-9 = Mild depression, 10-14 = Moderate depression, 15-19 = Moderately severe depression, 20-27 = Severe depression   Psychosocial Evaluation and Intervention:  Psychosocial Evaluation - 07/07/20 1010      Psychosocial Evaluation & Interventions   Interventions Encouraged to exercise with the program and follow exercise prescription;Stress management education;Relaxation education    Comments Her step daughter is great for support. Her niece is a great support system. She lives alone and has a positive outlook on her health.    Expected Outcomes Short: Exercise regularly to support mental health and notify staff of any changes. Long: maintain mental health and well being through teaching of rehab or prescribed medications independently.    Continue Psychosocial Services  Follow up required by staff           Psychosocial Re-Evaluation:  Psychosocial Re-Evaluation    Row Name 08/15/20 1113             Psychosocial Re-Evaluation   Current issues with Current Stress Concerns;Current Depression       Comments Riva is back in rehab. She has good days and bad days, but gets through it.  Her boyfriend has been recently diagnosised with Non-Hodgkins lymphoma and undergoing treatment.  He has his PET scan today and follows up with doctor on  Wednesdays and also getting his COVID shots.  They will determine course of treatment based off results.  Her meds make her fatigued so she is sleeping well and naps for about 1-2 hrs during the afternoon.  She is doing the best she can.  She has also been dealing with stomach pain/indigestion which has come back   as IBS.  She has started a new medication for it and is finally starting to feel better.       Expected Outcomes Short: Continue to cope with boyfriend's cancer Long: Continue to focus on positive and taking care of herself.       Interventions Encouraged to attend Cardiac Rehabilitation for the exercise;Stress management education       Continue Psychosocial Services  Follow up required by staff              Psychosocial Discharge (Final Psychosocial Re-Evaluation):  Psychosocial Re-Evaluation - 08/15/20 1113      Psychosocial Re-Evaluation   Current issues with Current Stress Concerns;Current Depression    Comments Aleena is back in rehab. She has good days and bad days, but gets through it.  Her boyfriend has been recently diagnosised with Non-Hodgkins lymphoma and undergoing treatment.  He has his PET scan today and follows up with doctor on Wednesdays and also getting his COVID shots.  They will determine course of treatment based off results.  Her meds make her fatigued so she is sleeping well and naps for about 1-2 hrs during the afternoon.  She is doing the best she can.  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  She has started a new medication for it and is finally starting to feel better.    Expected Outcomes Short: Continue to cope with boyfriend's cancer Long: Continue to focus on positive and taking care of herself.    Interventions Encouraged to attend Cardiac Rehabilitation for the exercise;Stress management education    Continue Psychosocial Services  Follow up required by staff           Vocational Rehabilitation: Provide  vocational rehab assistance to qualifying candidates.   Vocational Rehab Evaluation & Intervention:   Education: Education Goals: Education classes will be provided on a variety of topics geared toward better understanding of heart health and risk factor modification. Participant will state understanding/return demonstration of topics presented as noted by education test scores.  Learning Barriers/Preferences:  Learning Barriers/Preferences - 07/07/20 1007      Learning Barriers/Preferences   Learning Barriers None    Learning Preferences None           General Cardiac Education Topics:  AED/CPR: - Group verbal and written instruction with the use of models to demonstrate the basic use of the AED with the basic ABC's of resuscitation.   Anatomy and Cardiac Procedures: - Group verbal and visual presentation and models provide information about basic cardiac anatomy and function. Reviews the testing methods done to diagnose heart disease and the outcomes of the test results. Describes the treatment choices: Medical Management, Angioplasty, or Coronary Bypass Surgery for treating various heart conditions including Myocardial Infarction, Angina, Valve Disease, and Cardiac Arrhythmias.  Written material given at graduation. Flowsheet Row Cardiac Rehab from 07/14/2020 in [facility] Cardiac [facility]  Education need identified 07/14/20      Medication Safety: - Group verbal and visual instruction to review commonly prescribed medications for heart and lung disease. Reviews the medication, class of the drug, and side effects. Includes the steps to properly store meds and maintain the prescription regimen.  Written material given at graduation.   Intimacy: - Group verbal instruction through game format to discuss how heart and lung disease can affect sexual intimacy. Written material given at graduation.. Flowsheet Row Cardiac Rehab from 07/10/2018 in [facility]   Date   07/03/18  Educator [facility]  Instruction Review Code 1- Verbalizes Understanding      Know Your Numbers and Heart Failure: - Group verbal and visual instruction to discuss disease risk factors for cardiac and pulmonary disease and treatment options.  Reviews associated critical values for Overweight/Obesity, Hypertension, Cholesterol, and Diabetes.  Discusses basics of heart failure: signs/symptoms and treatments.  Introduces Heart Failure Zone chart for action plan for heart failure.  Written material given at graduation.   Infection Prevention: - Provides verbal and written material to individual with discussion of infection control including proper hand washing and proper equipment cleaning during exercise session. Flowsheet Row Cardiac Rehab from 07/14/2020 in [facility] Cardiac [facility] Pulmonary Rehab  Date 07/07/20  Educator [facility]  Instruction Review Code 1- Verbalizes Understanding      Falls Prevention: - Provides verbal and written material to individual with discussion of falls prevention and safety. Flowsheet Row Cardiac Rehab from 07/14/2020 in [facility] Cardiac and Pulmonary Rehab  Date 07/07/20  Educator [facility]  Instruction Review Code 1- Verbalizes Understanding      Other: -Provides group and verbal instruction on various topics (see comments)   Knowledge Questionnaire Score:  Knowledge Questionnaire Score - 07/14/20 1159      Knowledge Questionnaire Score   Pre Score 24/26 Education Focus: Angina, Nutrition           Core Components/Risk Factors/Patient Goals at Admission:  Personal Goals and Risk Factors at Admission - 07/14/20 1159      Core Components/Risk Factors/Patient Goals on Admission    Weight Management Yes;Weight Loss;Obesity    Intervention Weight Management: Develop a combined nutrition and exercise program designed to reach desired caloric intake, while maintaining appropriate intake of nutrient and fiber, sodium and fats, and appropriate energy expenditure  required for the weight goal.;Weight Management: Provide education and appropriate resources to help participant work on and attain dietary goals.;Weight Management/Obesity: Establish reasonable short term and long term weight goals.;Obesity: Provide education and appropriate resources to help participant work on and attain dietary goals.    Admit Weight 175 lb 6.4 oz (79.6 kg)    Goal Weight: Short Term 170 lb (77.1 kg)    Goal Weight: Long Term 165 lb (74.8 kg)    Expected Outcomes Short Term: Continue to assess and modify interventions until short term weight is achieved;Long Term: Adherence to nutrition and physical activity/exercise program aimed toward attainment of established weight goal;Weight Loss: Understanding of general recommendations for a balanced deficit meal plan, which promotes 1-2 lb weight loss per week and includes a negative energy balance of [phone] kcal/d;Understanding recommendations for meals to include 15-35% energy as protein, 25-35% energy from fat, 35-60% energy from carbohydrates, less than 261m of dietary cholesterol, 20-35 gm of total fiber daily;Understanding of distribution of calorie intake throughout the day with the consumption of 4-5 meals/snacks    Hypertension Yes    Intervention Provide education on lifestyle modifcations including regular physical activity/exercise, weight management, moderate sodium restriction and increased consumption of fresh fruit, vegetables, and low fat dairy, alcohol moderation, and smoking cessation.;Monitor prescription use compliance.    Expected Outcomes Short Term: Continued assessment and intervention until BP is < 140/925mHG in hypertensive participants. < 130/8041mG in hypertensive participants with diabetes, heart failure or chronic kidney disease.;Long Term: Maintenance of blood pressure at goal levels.    Lipids Yes    Intervention Provide education and support for participant on nutrition & aerobic/resistive exercise along  with prescribed medications to achieve LDL <73m43mDL >  51m.    Expected Outcomes Short Term: Participant states understanding of desired cholesterol values and is compliant with medications prescribed. Participant is following exercise prescription and nutrition guidelines.;Long Term: Cholesterol controlled with medications as prescribed, with individualized exercise RX and with personalized nutrition plan. Value goals: LDL < 786m HDL > 40 mg.           Education:Diabetes - Individual verbal and written instruction to review signs/symptoms of diabetes, desired ranges of glucose level fasting, after meals and with exercise. Acknowledge that pre and post exercise glucose checks will be done for 3 sessions at entry of program.   Core Components/Risk Factors/Patient Goals Review:   Goals and Risk Factor Review    Row Name 08/15/20 1122             Core Components/Risk Factors/Patient Goals Review   Personal Goals Review Weight Management/Obesity;Hypertension       Review NaEleciaas started a new medication for her stomach.  She is starting to feel better now.  She did lose some weight while not feeling well, but she is also eating better now with more fruit so it is staying down.  Her blood pressures have been doing okay and she checks them at home.              Core Components/Risk Factors/Patient Goals at Discharge (Final Review):   Goals and Risk Factor Review - 08/15/20 1122      Core Components/Risk Factors/Patient Goals Review   Personal Goals Review Weight Management/Obesity;Hypertension    Review NaYoshieas started a new medication for her stomach.  She is starting to feel better now.  She did lose some weight while not feeling well, but she is also eating better now with more fruit so it is staying down.  Her blood pressures have been doing okay and she checks them at home.           ITP Comments:  ITP Comments    Row Name 07/07/20 1007 07/14/20 1150 07/15/20 1107  07/27/20 1121 08/24/20 [id number] ITP Comments Virtual Visit completed. Patient informed on EP and RD appointment and 6 Minute walk test. Patient also informed of patient health questionnaires on My Chart. Patient Verbalizes understanding. [facility] Completed 6MWT and gym orientation. Initial ITP created and sent for review to Dr. MaEmily FilbertMedical Director. First full day of exercise!  Patient was oriented to gym and equipment including functions, settings, policies, and procedures.  Patient's individual exercise prescription and treatment plan were reviewed.  All starting workloads were established based on the results of the 6 minute walk test done at initial orientation visit.  The plan for exercise progression was also introduced and progression will be customized based on patient's performance and goals. 30 Day review completed. Medical Director ITP review done, changes made as directed, and signed approval by Medical Director.  New to program 30 Day review completed. Medical Director ITP review done, changes made as directed, and signed approval by Medical Director. one visit in April   Row Name 09/15/20 0755 09/19/20 0748 09/21/20 0758       ITP Comments Jolana's attendance has been inconsistent since last goal review - unable to get goals for this 30-day review. Katerina left message on Thursday.  She has tested positive for COVID.  She will be out again this week.  Since she has not attended, we have not been able to talk to her about her goals  and update status. 30 Day review completed. Medical Director ITP review done, changes made as directed, and signed approval by Medical Director.   sporadic attendence  now has Covid            Comments:

## 2020-09-29 ENCOUNTER — Telehealth: Payer: Self-pay

## 2020-09-29 NOTE — Telephone Encounter (Signed)
Monica Bush is feeling better - she was feeling dizzy yesterday.  Plans to return 6/1

## 2020-10-10 ENCOUNTER — Encounter: Payer: Medicare Other | Attending: Cardiology

## 2020-10-10 DIAGNOSIS — Z9861 Coronary angioplasty status: Secondary | ICD-10-CM | POA: Insufficient documentation

## 2020-10-10 DIAGNOSIS — Z955 Presence of coronary angioplasty implant and graft: Secondary | ICD-10-CM | POA: Insufficient documentation

## 2020-10-11 ENCOUNTER — Encounter: Payer: Self-pay | Admitting: *Deleted

## 2020-10-11 DIAGNOSIS — Z955 Presence of coronary angioplasty implant and graft: Secondary | ICD-10-CM

## 2020-10-17 DIAGNOSIS — Z955 Presence of coronary angioplasty implant and graft: Secondary | ICD-10-CM

## 2020-10-19 ENCOUNTER — Encounter: Payer: Self-pay | Admitting: *Deleted

## 2020-10-19 ENCOUNTER — Telehealth: Payer: Self-pay

## 2020-10-19 DIAGNOSIS — Z955 Presence of coronary angioplasty implant and graft: Secondary | ICD-10-CM

## 2020-10-19 NOTE — Telephone Encounter (Signed)
Returned Monica Bush's call - she wants to be discharged from [facility] due to relapse of Covid - she will get a referral from her Dr for Pulmonary Rehab

## 2020-10-19 NOTE — Progress Notes (Signed)
Cardiac Individual Treatment Plan  Patient Details  Name: Monica Bush MRN: [id number] Date of Birth: 12/19/1940 Referring Provider:   Flowsheet Row Cardiac Rehab from 07/14/2020 in [facility] Cardiac and Pulmonary Rehab  Referring Provider Teodoro Kil MD       Initial Encounter Date:  Flowsheet Row Cardiac Rehab from 07/14/2020 in [facility] Cardiac and Pulmonary Rehab  Date 07/14/20       Visit Diagnosis: Status post coronary artery stent placement  Patient's Home Medications on Admission:  Current Outpatient Medications:    acetaminophen (TYLENOL) 500 MG tablet, Take by mouth., Disp: , Rfl:    amLODipine (NORVASC) 10 MG tablet, Take 10 mg by mouth daily., Disp: , Rfl:    amLODipine (NORVASC) 10 MG tablet, Take 1 tablet by mouth daily. (Patient not taking: Reported on 07/07/2020), Disp: , Rfl:    ascorbic acid (VITAMIN C) 100 MG tablet, Take by mouth., Disp: , Rfl:    aspirin 81 MG chewable tablet, Chew 81 mg by mouth daily., Disp: , Rfl:    atorvastatin (LIPITOR) 40 MG tablet, Take 40 mg by mouth daily., Disp: , Rfl:    azelastine (ASTELIN) 0.1 % nasal spray, Place into the nose., Disp: , Rfl:    BRILINTA 90 MG TABS tablet, , Disp: , Rfl:    chlorpheniramine (CHLOR-TRIMETON) 4 MG tablet, Take by mouth., Disp: , Rfl:    Cholecalciferol (VITAMIN D3) 25 MCG (1000 UT) CAPS, Take by mouth., Disp: , Rfl:    clopidogrel (PLAVIX) 75 MG tablet, Take by mouth., Disp: , Rfl:    famotidine (PEPCID) 40 MG tablet, Take by mouth., Disp: , Rfl:    fluticasone (FLONASE) 50 MCG/ACT nasal spray, Place into the nose., Disp: , Rfl:    Multiple Vitamin (MULTI-VITAMIN) tablet, Take 1 tablet by mouth daily., Disp: , Rfl:    nitroGLYCERIN (NITROSTAT) 0.4 MG SL tablet, , Disp: , Rfl:    nitroGLYCERIN (NITROSTAT) 0.4 MG SL tablet, Place 1 tablet under the tongue every 5 (five) minutes as needed. (Patient not taking: Reported on 07/07/2020), Disp: , Rfl:    nystatin ointment (MYCOSTATIN), Apply topically 2 (two) times  daily., Disp: , Rfl:    STUDY - ASPIRE - aspirin 81 mg or placebo tablet (PI-Sethi), Take 1 tablet by mouth daily., Disp: , Rfl:   Past Medical History: Past Medical History:  Diagnosis Date   Anxiety    Asthma    CAD (coronary artery disease)    Hyperlipemia    Hypertension    Osteopetrosis    Reflux esophagitis     Tobacco Use: Social History   Tobacco Use  Smoking Status Never  Smokeless Tobacco Never    Labs: Recent Review Flowsheet Data   There is no flowsheet data to display.      Exercise Target Goals: Exercise Program Goal: Individual exercise prescription set using results from initial 6 min walk test and THRR while considering  patient's activity barriers and safety.   Exercise Prescription Goal: Initial exercise prescription builds to 30-45 minutes a day of aerobic activity, 2-3 days per week.  Home exercise guidelines will be given to patient during program as part of exercise prescription that the participant will acknowledge.   Education: Aerobic Exercise: - Group verbal and visual presentation on the components of exercise prescription. Introduces F.I.T.T principle from ACSM for exercise prescriptions.  Reviews F.I.T.T. principles of aerobic exercise including progression. Written material given at graduation.   Education: Resistance Exercise: - Group verbal and visual presentation on the components  of exercise prescription. Introduces F.I.T.T principle from ACSM for exercise prescriptions  Reviews F.I.T.T. principles of resistance exercise including progression. Written material given at graduation.    Education: Exercise & Equipment Safety: - Individual verbal instruction and demonstration of equipment use and safety with use of the equipment. Flowsheet Row Cardiac Rehab from 07/14/2020 in [facility] Cardiac and Pulmonary Rehab  Date 07/07/20  Educator [facility]  Instruction Review Code 1- Verbalizes Understanding       Education: Exercise Physiology &  General Exercise Guidelines: - Group verbal and written instruction with models to review the exercise physiology of the cardiovascular system and associated critical values. Provides general exercise guidelines with specific guidelines to those with heart or lung disease.    Education: Flexibility, Balance, Mind/Body Relaxation: - Group verbal and visual presentation with interactive activity on the components of exercise prescription. Introduces F.I.T.T principle from ACSM for exercise prescriptions. Reviews F.I.T.T. principles of flexibility and balance exercise training including progression. Also discusses the mind body connection.  Reviews various relaxation techniques to help reduce and manage stress (i.e. Deep breathing, progressive muscle relaxation, and visualization). Balance handout provided to take home. Written material given at graduation.   Activity Barriers & Risk Stratification:  Activity Barriers & Cardiac Risk Stratification - 07/14/20 1151       Activity Barriers & Cardiac Risk Stratification   Activity Barriers Deconditioning;Muscular Weakness;Balance Concerns    Cardiac Risk Stratification Moderate             6 Minute Walk:  6 Minute Walk     Row Name 07/14/20 1150         6 Minute Walk   Phase Initial     Distance 1195 feet     Walk Time 6 minutes     # of Rest Breaks 0     MPH 2.26     METS 1.72     RPE 12     Perceived Dyspnea  1     VO2 Peak 6.03     Symptoms Yes (comment)     Comments balance felt off at end     Resting HR 80 bpm     Resting BP 122/80     Resting Oxygen Saturation  96 %     Exercise Oxygen Saturation  during 6 min walk 96 %     Max Ex. HR 100 bpm     Max Ex. BP 126/66     2 Minute Post BP 120/66              Oxygen Initial Assessment:   Oxygen Re-Evaluation:   Oxygen Discharge (Final Oxygen Re-Evaluation):   Initial Exercise Prescription:  Initial Exercise Prescription - 07/14/20 1100       Date of  Initial Exercise RX and Referring Provider   Date 07/14/20    Referring Provider Teodoro Kil MD      Treadmill   MPH 2    Grade 0.5    Minutes 15    METs 2.75      Recumbant Bike   Level 1    RPM 50    Watts 10    Minutes 15    METs 2      NuStep   Level 1    SPM 80    Minutes 15    METs 2      REL-XR   Level 1    Speed 50    Minutes 15    METs 2  T5 Nustep   Level 1    SPM 80    Minutes 15    METs 2      Prescription Details   Frequency (times per week) 3    Duration Progress to 30 minutes of continuous aerobic without signs/symptoms of physical distress      Intensity   THRR 40-80% of Max Heartrate 105-130    Ratings of Perceived Exertion 11-13    Perceived Dyspnea 0-4      Progression   Progression Continue to progress workloads to maintain intensity without signs/symptoms of physical distress.      Resistance Training   Training Prescription Yes    Weight 3 lb    Reps 10-15             Perform Capillary Blood Glucose checks as needed.  Exercise Prescription Changes:   Exercise Prescription Changes     Row Name 07/14/20 1100 07/19/20 0800 08/15/20 1100 08/29/20 1300 09/13/20 1400     Response to Exercise   Blood Pressure (Admit) 122/80 126/64 102/62 112/62 102/56   Blood Pressure (Exercise) 126/66 122/62 128/70 122/60 138/62   Blood Pressure (Exit) 120/66 102/60 106/60 110/60 110/62   Heart Rate (Admit) 80 bpm 86 bpm 76 bpm 81 bpm 85 bpm   Heart Rate (Exercise) 100 bpm 112 bpm 108 bpm 111 bpm 103 bpm   Heart Rate (Exit) 71 bpm 93 bpm 77 bpm 84 bpm 76 bpm   Oxygen Saturation (Admit) 96 % -- -- -- --   Oxygen Saturation (Exercise) 96 % -- -- -- --   Rating of Perceived Exertion (Exercise) _0 Perceived Dyspnea (Exercise) 1 -- -- -- --   Symptoms balance off at end none none none none   Comments walk test results first full day of exercise -- -- --   Duration -- Continue with 30 min of aerobic exercise without  signs/symptoms of physical distress. Continue with 30 min of aerobic exercise without signs/symptoms of physical distress. Continue with 30 min of aerobic exercise without signs/symptoms of physical distress. Continue with 30 min of aerobic exercise without signs/symptoms of physical distress.   Intensity -- THRR unchanged THRR unchanged THRR unchanged THRR unchanged     Progression   Progression -- Continue to progress workloads to maintain intensity without signs/symptoms of physical distress. Continue to progress workloads to maintain intensity without signs/symptoms of physical distress. Continue to progress workloads to maintain intensity without signs/symptoms of physical distress. Continue to progress workloads to maintain intensity without signs/symptoms of physical distress.   Average METs -- 2.6 2.22 2.75 1.8     Resistance Training   Training Prescription -- Yes Yes Yes Yes   Weight -- 3 lb 3 lb 3 lb 3 lb   Reps -- 10-15 10-15 10-15 10-15     Interval Training   Interval Training -- No No No No     Treadmill   MPH -- -- 2 2 --   Grade -- -- 0.5 0.5 --   Minutes -- -- 15 15 --   METs -- -- 2.67 2.67 --     Recumbant Bike   Level -- 3 -- 3 2   Minutes -- 15 -- 15 15   METs -- 2.9 -- 2.8 --     NuStep   Level -- 3 -- -- 2   Minutes -- 15 -- -- 15   METs -- 2.3 -- -- 1.8     REL-XR  Level -- -- 5 -- --   Minutes -- -- 15 -- --   METs -- -- 2.2 -- --     T5 Nustep   Level -- -- 2 -- --   Minutes -- -- 15 -- --   METs -- -- 1.8 -- --     Home Exercise Plan   Plans to continue exercise at -- -- Home (comment)  walking, staff videos Home (comment)  walking, staff videos Home (comment)  walking, staff videos   Frequency -- -- Add 2 additional days to program exercise sessions. Add 2 additional days to program exercise sessions. Add 2 additional days to program exercise sessions.   Initial Home Exercises Provided -- -- 08/15/20 08/15/20 08/15/20             Exercise Comments:   Exercise Goals and Review:   Exercise Goals     Row Name 07/14/20 1153             Exercise Goals   Increase Physical Activity Yes       Intervention Provide advice, education, support and counseling about physical activity/exercise needs.;Develop an individualized exercise prescription for aerobic and resistive training based on initial evaluation findings, risk stratification, comorbidities and participant's personal goals.       Expected Outcomes Short Term: Attend rehab on a regular basis to increase amount of physical activity.;Long Term: Add in home exercise to make exercise part of routine and to increase amount of physical activity.;Long Term: Exercising regularly at least 3-5 days a week.       Increase Strength and Stamina Yes       Intervention Provide advice, education, support and counseling about physical activity/exercise needs.;Develop an individualized exercise prescription for aerobic and resistive training based on initial evaluation findings, risk stratification, comorbidities and participant's personal goals.       Expected Outcomes Short Term: Increase workloads from initial exercise prescription for resistance, speed, and METs.;Short Term: Perform resistance training exercises routinely during rehab and add in resistance training at home;Long Term: Improve cardiorespiratory fitness, muscular endurance and strength as measured by increased METs and functional capacity (6MWT)       Able to understand and use rate of perceived exertion (RPE) scale Yes       Intervention Provide education and explanation on how to use RPE scale       Expected Outcomes Short Term: Able to use RPE daily in rehab to express subjective intensity level;Long Term:  Able to use RPE to guide intensity level when exercising independently       Able to understand and use Dyspnea scale Yes       Intervention Provide education and explanation on how to use Dyspnea scale        Expected Outcomes Long Term: Able to use Dyspnea scale to guide intensity level when exercising independently;Short Term: Able to use Dyspnea scale daily in rehab to express subjective sense of shortness of breath during exertion       Knowledge and understanding of Target Heart Rate Range (THRR) Yes       Intervention Provide education and explanation of THRR including how the numbers were predicted and where they are located for reference       Expected Outcomes Short Term: Able to use daily as guideline for intensity in rehab;Short Term: Able to state/look up THRR;Long Term: Able to use THRR to govern intensity when exercising independently       Able to check pulse independently  Yes       Intervention Provide education and demonstration on how to check pulse in carotid and radial arteries.;Review the importance of being able to check your own pulse for safety during independent exercise       Expected Outcomes Short Term: Able to explain why pulse checking is important during independent exercise;Long Term: Able to check pulse independently and accurately       Understanding of Exercise Prescription Yes       Intervention Provide education, explanation, and written materials on patient's individual exercise prescription       Expected Outcomes Short Term: Able to explain program exercise prescription;Long Term: Able to explain home exercise prescription to exercise independently                Exercise Goals Re-Evaluation :  Exercise Goals Re-Evaluation     Row Name 07/15/20 1108 07/19/20 0849 08/15/20 1106 08/29/20 1340 09/13/20 1359     Exercise Goal Re-Evaluation   Exercise Goals Review Increase Physical Activity;Able to understand and use rate of perceived exertion (RPE) scale;Knowledge and understanding of Target Heart Rate Range (THRR);Understanding of Exercise Prescription;Increase Strength and Stamina;Able to understand and use Dyspnea scale;Able to check pulse independently  Increase Physical Activity;Increase Strength and Stamina;Understanding of Exercise Prescription Increase Physical Activity;Increase Strength and Stamina;Understanding of Exercise Prescription Increase Physical Activity;Increase Strength and Stamina Increase Physical Activity;Increase Strength and Stamina;Understanding of Exercise Prescription   Comments Reviewed RPE and dyspnea scales, THR and program prescription with pt today.  Pt voiced understanding and was given a copy of goals to take home. Only attended first day so far.  We will continue to monitor Monica Bush is back!  She is feeling pretty good again, finally.  She has not gotten into her exercise at home yet.  Updated home exercise today, Monica Bush plans to walk and use staff vidoes for exercise at home.  Reviewed scales and THR again. Monica Bush has been attending once per week since she returned.  Consistent attendance will yield better results. Monica Bush continues to struggle with attendance.  Last week her stomach flared up again and this week she had an appointment.  We will continue to encourage improved attendance.  She is on level 2 on the bike.  We will continue to monitor her progress.   Expected Outcomes Short: Use RPE daily to regulate intensity. Long: Follow program prescription in THR. Short: Attend rehab regularly Long: contiue to follow program prescription Short: Back into routine exercise Long: Continue to improve stamina. Short:  attend consistently Long:  improve stamina and MET level Short: Consistent attendance Long: Continue to improve stamina    Row Name 10/11/20 1426             Exercise Goal Re-Evaluation   Comments Out since last review                Discharge Exercise Prescription (Final Exercise Prescription Changes):  Exercise Prescription Changes - 09/13/20 1400       Response to Exercise   Blood Pressure (Admit) 102/56    Blood Pressure (Exercise) 138/62    Blood Pressure (Exit) 110/62    Heart Rate (Admit) 85 bpm     Heart Rate (Exercise) 103 bpm    Heart Rate (Exit) 76 bpm    Rating of Perceived Exertion (Exercise) 12    Symptoms none    Duration Continue with 30 min of aerobic exercise without signs/symptoms of physical distress.    Intensity THRR unchanged  Progression   Progression Continue to progress workloads to maintain intensity without signs/symptoms of physical distress.    Average METs 1.8      Resistance Training   Training Prescription Yes    Weight 3 lb    Reps 10-15      Interval Training   Interval Training No      Recumbant Bike   Level 2    Minutes 15      NuStep   Level 2    Minutes 15    METs 1.8      Home Exercise Plan   Plans to continue exercise at Home (comment)   walking, staff videos   Frequency Add 2 additional days to program exercise sessions.    Initial Home Exercises Provided 08/15/20             Nutrition:  Target Goals: Understanding of nutrition guidelines, daily intake of sodium <1525m, cholesterol <2031m calories 30% from fat and 7% or less from saturated fats, daily to have 5 or more servings of fruits and vegetables.  Education: All About Nutrition: -Group instruction provided by verbal, written material, interactive activities, discussions, models, and posters to present general guidelines for heart healthy nutrition including fat, fiber, MyPlate, the role of sodium in heart healthy nutrition, utilization of the nutrition label, and utilization of this knowledge for meal planning. Follow up email sent as well. Written material given at graduation. [facility]  Education need identified 07/14/20       Biometrics:  Pre Biometrics - 07/14/20 1154       Pre Biometrics   Height 5' 1.1" (1.552 m)    Weight 175 lb 6.4 oz (79.6 kg)    BMI (Calculated) 33.03    Single Leg Stand 8.1 seconds              Nutrition Therapy Plan and Nutrition Goals:  Nutrition  Therapy & Goals - 09/05/20 1154       Nutrition Therapy   Diet Heart healthy, low Na    Drug/Food Interactions Statins/Certain Fruits    Protein (specify units) 65g    Fiber 25 grams    Whole Grain Foods 3 servings    Saturated Fats 12 max. grams    Fruits and Vegetables 5 servings/day    Sodium 1.5 grams      Personal Nutrition Goals   Nutrition Goal ST: Continue to introduce foods slowly LT: figure out what she can or cannot tolerate    Comments She got food poisoning back in March - anti nausea medications and pepcid now since then. She can't eat fried food, she has not eaten cheese, dairy gives her problems, she has not tried salad again - she has had lettuce and tomato on a sandwich she is hesitent to try yogurt- she has IBS. She is back to eating more fruit (variety). Cooked vegetables are ok - onions are giving her issues. She is taking a probiotic - daily in coffee. She is still trying to limit her sodium and saturated fat,she also continues to eat fruits, vegetables, beans, and lean protein. She will make chicken noodle soup and sauce from scratch with no salt. She also makes scrambled eggs with english muffin and chives in am which she tolerates well. Reviewed heart healthy eating.  Pt should continue slowly introducing foods as she is.             Nutrition Assessments:  MEDIFICTS  Score Key: ?70 Need to make dietary changes  40-70 Heart Healthy Diet ? 40 Therapeutic Level Cholesterol Diet  Flowsheet Row Cardiac Rehab from 07/14/2020 in [facility] Cardiac and Pulmonary Rehab  Picture Your Plate Total Score on Admission 62      Picture Your Plate Scores: <02 Unhealthy dietary pattern with much room for improvement. 41-50 Dietary pattern unlikely to meet recommendations for good health and room for improvement. 51-60 More healthful dietary pattern, with some room for improvement.  >60 Healthy dietary pattern, although there may be some specific behaviors that could be  improved.    Nutrition Goals Re-Evaluation:  Nutrition Goals Re-Evaluation     Monica Bush Name 08/15/20 1119             Goals   Nutrition Goal Meet with dietician       Comment Monica Bush has just started to get her appetite back after stomach issues.  She will set up an appointment with dietician.  She is trying to stay away from dairy to help.       Expected Outcome Meet with dietician                Nutrition Goals Discharge (Final Nutrition Goals Re-Evaluation):  Nutrition Goals Re-Evaluation - 08/15/20 1119       Goals   Nutrition Goal Meet with dietician    Comment Monica Bush has just started to get her appetite back after stomach issues.  She will set up an appointment with dietician.  She is trying to stay away from dairy to help.    Expected Outcome Meet with dietician             Psychosocial: Target Goals: Acknowledge presence or absence of significant depression and/or stress, maximize coping skills, provide positive support system. Participant is able to verbalize types and ability to use techniques and skills needed for reducing stress and depression.   Education: Stress, Anxiety, and Depression - Group verbal and visual presentation to define topics covered.  Reviews how body is impacted by stress, anxiety, and depression.  Also discusses healthy ways to reduce stress and to treat/manage anxiety and depression.  Written material given at graduation. Flowsheet Row Cardiac Rehab from 07/10/2018 in [facility] Cardiac and Pulmonary Rehab  Date 07/08/18  Educator [facility]  Instruction Review Code 1- [employer] Understanding       Education: Sleep Hygiene -Provides group verbal and written instruction about how sleep can affect your health.  Define sleep hygiene, discuss sleep cycles and impact of sleep habits. Review good sleep hygiene tips.  Flowsheet Row Cardiac Rehab from 07/10/2018 in [facility] Cardiac and Pulmonary Rehab  Date 06/10/18  Educator [facility]  Instruction Review Code 1-  Verbalizes Understanding       Initial Review & Psychosocial Screening:  Initial Psych Review & Screening - 07/07/20 1008       Initial Review   Current issues with None Identified      Family Dynamics   Good Support System? Yes    Comments Her step daughter is great for support. Her niece is a great support system. She lives alone and has a positive outlook on her health.      Barriers   Psychosocial barriers to participate in program There are no identifiable barriers or psychosocial needs.;The patient should benefit from training in stress management and relaxation.      Screening Interventions   Interventions Encouraged to exercise;Provide feedback about the scores to participant;To provide support and resources with identified psychosocial needs  Expected Outcomes Short Term goal: Utilizing psychosocial counselor, staff and physician to assist with identification of specific Stressors or current issues interfering with healing process. Setting desired goal for each stressor or current issue identified.;Long Term Goal: Stressors or current issues are controlled or eliminated.;Short Term goal: Identification and review with participant of any Quality of Life or Depression concerns found by scoring the questionnaire.;Long Term goal: The participant improves quality of Life and PHQ9 Scores as seen by post scores and/or verbalization of changes             Quality of Life Scores:   Quality of Life - 07/14/20 1157       Quality of Life   Select Quality of Life      Quality of Life Scores   Health/Function Pre 25.77 %    Socioeconomic Pre 30 %    Psych/Spiritual Pre 30 %    Family Pre 30 %    GLOBAL Pre 28.08 %            Scores of 19 and below usually indicate a poorer quality of life in these areas.  A difference of  2-3 points is a clinically meaningful difference.  A difference of 2-3 points in the total score of the Quality of Life Index has been associated with  significant improvement in overall quality of life, self-image, physical symptoms, and general health in studies assessing change in quality of life.  PHQ-9: Recent Review Flowsheet Data     Depression screen [facility] 2/9 07/14/2020 01/15/2019 05/29/2018   Decreased Interest 0 0 0   Down, Depressed, Hopeless 0 0 0   PHQ - 2 Score 0 0 0   Altered sleeping 1 1 0   Tired, decreased energy _0 Change in appetite 1 0 1   Feeling bad or failure about yourself  0 0 1   Trouble concentrating 0 0 0   Moving slowly or fidgety/restless 0 0 0   Suicidal thoughts 0 0 0   PHQ-9 Score _1 Difficult doing work/chores Somewhat difficult Not difficult at all Somewhat difficult      Interpretation of Total Score  Total Score Depression Severity:  1-4 = Minimal depression, 5-9 = Mild depression, 10-14 = Moderate depression, 15-19 = Moderately severe depression, 20-27 = Severe depression   Psychosocial Evaluation and Intervention:  Psychosocial Evaluation - 07/07/20 1010       Psychosocial Evaluation & Interventions   Interventions Encouraged to exercise with the program and follow exercise prescription;Stress management education;Relaxation education    Comments Her step daughter is great for support. Her niece is a great support system. She lives alone and has a positive outlook on her health.    Expected Outcomes Short: Exercise regularly to support mental health and notify staff of any changes. Long: maintain mental health and well being through teaching of rehab or prescribed medications independently.    Continue Psychosocial Services  Follow up required by staff             Psychosocial Re-Evaluation:  Psychosocial Re-Evaluation     Monica Bush Name 08/15/20 1113             Psychosocial Re-Evaluation   Current issues with Current Stress Concerns;Current Depression       Comments Monica Bush is back in rehab. She has good days and bad days, but gets through it.  Her boyfriend has been  recently diagnosised with Non-Hodgkins lymphoma and undergoing treatment.  He has his PET scan today and follows up with doctor on Wednesdays and also getting his COVID shots.  They will determine course of treatment based off results.  Her meds make her fatigued so she is sleeping well and naps for about 1-2 hrs during the afternoon.  She is doing the best she can.  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  She has started a new medication for it and is finally starting to feel better.       Expected Outcomes Short: Continue to cope with boyfriend's cancer Long: Continue to focus on positive and taking care of herself.       Interventions Encouraged to attend Cardiac Rehabilitation for the exercise;Stress management education       Continue Psychosocial Services  Follow up required by staff                Psychosocial Discharge (Final Psychosocial Re-Evaluation):  Psychosocial Re-Evaluation - 08/15/20 1113       Psychosocial Re-Evaluation   Current issues with Current Stress Concerns;Current Depression    Comments Monica Bush is back in rehab. She has good days and bad days, but gets through it.  Her boyfriend has been recently diagnosised with Non-Hodgkins lymphoma and undergoing treatment.  He has his PET scan today and follows up with doctor on Wednesdays and also getting his COVID shots.  They will determine course of treatment based off results.  Her meds make her fatigued so she is sleeping well and naps for about 1-2 hrs during the afternoon.  She is doing the best she can.  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  She has started a new medication for it and is finally starting to feel better.    Expected Outcomes Short: Continue to cope with boyfriend's cancer Long: Continue to focus on positive and taking care of herself.    Interventions Encouraged to attend Cardiac Rehabilitation for the exercise;Stress  management education    Continue Psychosocial Services  Follow up required by staff             Vocational Rehabilitation: Provide vocational rehab assistance to qualifying candidates.   Vocational Rehab Evaluation & Intervention:   Education: Education Goals: Education classes will be provided on a variety of topics geared toward better understanding of heart health and risk factor modification. Participant will state understanding/return demonstration of topics presented as noted by education test scores.  Learning Barriers/Preferences:  Learning Barriers/Preferences - 07/07/20 1007       Learning Barriers/Preferences   Learning Barriers None    Learning Preferences None             General Cardiac Education Topics:  AED/CPR: - Group verbal and written instruction with the use of models to demonstrate the basic use of the AED with the basic ABC's of resuscitation.   Anatomy and Cardiac Procedures: - Group verbal and visual presentation and models provide information about basic cardiac anatomy and function. Reviews the testing methods done to diagnose heart disease and the outcomes of the test results. Describes the treatment choices: Medical Management, Angioplasty, or Coronary Bypass Surgery for treating various heart conditions including Myocardial Infarction, Angina, Valve Disease, and Cardiac Arrhythmias.  Written material given at graduation. Flowsheet Row Cardiac Rehab from 07/14/2020 in [facility] Cardiac and Pulmonary Rehab  Education need identified 07/14/20       Medication Safety: - Group verbal and visual instruction  to review commonly prescribed medications for heart and lung disease. Reviews the medication, class of the drug, and side effects. Includes the steps to properly store meds and maintain the prescription regimen.  Written material given at graduation.   Intimacy: - Group verbal instruction through game format to discuss how heart and lung disease  can affect sexual intimacy. Written material given at graduation.. Flowsheet Row Cardiac Rehab from 07/10/2018 in [facility] Cardiac [facility] Pulmonary Rehab  Date 07/03/18  Educator [facility]  Instruction Review Code 1- Verbalizes Understanding       Know Your Numbers and Heart Failure: - Group verbal and visual instruction to discuss disease risk factors for cardiac and pulmonary disease and treatment options.  Reviews associated critical values for Overweight/Obesity, Hypertension, Cholesterol, and Diabetes.  Discusses basics of heart failure: signs/symptoms and treatments.  Introduces Heart Failure Zone chart for action plan for heart failure.  Written material given at graduation.   Infection Prevention: - Provides verbal and written material to individual with discussion of infection control including proper hand washing and proper equipment cleaning during exercise session. Flowsheet Row Cardiac Rehab from 07/14/2020 in [facility] Cardiac and Pulmonary Rehab  Date 07/07/20  Educator [facility]  Instruction Review Code 1- Verbalizes Understanding       Falls Prevention: - Provides verbal and written material to individual with discussion of falls prevention and safety. Flowsheet Row Cardiac Rehab from 07/14/2020 in [facility] Cardiac and Pulmonary Rehab  Date 07/07/20  Educator [facility]  Instruction Review Code 1- Verbalizes Understanding       Other: -Provides group and verbal instruction on various topics (see comments)   Knowledge Questionnaire Score:  Knowledge Questionnaire Score - 07/14/20 1159       Knowledge Questionnaire Score   Pre Score 24/26 Education Focus: Angina, Nutrition             Core Components/Risk Factors/Patient Goals at Admission:  Personal Goals and Risk Factors at Admission - 07/14/20 1159       Core Components/Risk Factors/Patient Goals on Admission    Weight Management Yes;Weight Loss;Obesity    Intervention Weight Management: Develop a combined nutrition and exercise  program designed to reach desired caloric intake, while maintaining appropriate intake of nutrient and fiber, sodium and fats, and appropriate energy expenditure required for the weight goal.;Weight Management: Provide education and appropriate resources to help participant work on and attain dietary goals.;Weight Management/Obesity: Establish reasonable short term and long term weight goals.;Obesity: Provide education and appropriate resources to help participant work on and attain dietary goals.    Admit Weight 175 lb 6.4 oz (79.6 kg)    Goal Weight: Short Term 170 lb (77.1 kg)    Goal Weight: Long Term 165 lb (74.8 kg)    Expected Outcomes Short Term: Continue to assess and modify interventions until short term weight is achieved;Long Term: Adherence to nutrition and physical activity/exercise program aimed toward attainment of established weight goal;Weight Loss: Understanding of general recommendations for a balanced deficit meal plan, which promotes 1-2 lb weight loss per week and includes a negative energy balance of [phone] kcal/d;Understanding recommendations for meals to include 15-35% energy as protein, 25-35% energy from fat, 35-60% energy from carbohydrates, less than 221m of dietary cholesterol, 20-35 gm of total fiber daily;Understanding of distribution of calorie intake throughout the day with the consumption of 4-5 meals/snacks    Hypertension Yes    Intervention Provide education on lifestyle modifcations including regular physical activity/exercise, weight management, moderate sodium restriction and increased consumption of fresh  fruit, vegetables, and low fat dairy, alcohol moderation, and smoking cessation.;Monitor prescription use compliance.    Expected Outcomes Short Term: Continued assessment and intervention until BP is < 140/49m HG in hypertensive participants. < 130/817mHG in hypertensive participants with diabetes, heart failure or chronic kidney disease.;Long Term:  Maintenance of blood pressure at goal levels.    Lipids Yes    Intervention Provide education and support for participant on nutrition & aerobic/resistive exercise along with prescribed medications to achieve LDL <7037mHDL >2m22m  Expected Outcomes Short Term: Participant states understanding of desired cholesterol values and is compliant with medications prescribed. Participant is following exercise prescription and nutrition guidelines.;Long Term: Cholesterol controlled with medications as prescribed, with individualized exercise RX and with personalized nutrition plan. Value goals: LDL < 70mg96mL > 40 mg.             Education:Diabetes - Individual verbal and written instruction to review signs/symptoms of diabetes, desired ranges of glucose level fasting, after meals and with exercise. Acknowledge that pre and post exercise glucose checks will be done for 3 sessions at entry of program.   Core Components/Risk Factors/Patient Goals Review:   Goals and Risk Factor Review     Row Name 08/15/20 1122             Core Components/Risk Factors/Patient Goals Review   Personal Goals Review Weight Management/Obesity;Hypertension       Review Monica Bush a new medication for her stomach.  She is starting to feel better now.  She did lose some weight while not feeling well, but she is also eating better now with more fruit so it is staying down.  Her blood pressures have been doing okay and she checks them at home.                Core Components/Risk Factors/Patient Goals at Discharge (Final Review):   Goals and Risk Factor Review - 08/15/20 1122       Core Components/Risk Factors/Patient Goals Review   Personal Goals Review Weight Management/Obesity;Hypertension    Review Monica Bush a new medication for her stomach.  She is starting to feel better now.  She did lose some weight while not feeling well, but she is also eating better now with more fruit so it is staying  down.  Her blood pressures have been doing okay and she checks them at home.             ITP Comments:  ITP Comments     Row Name 07/07/20 1007 07/14/20 1150 07/15/20 1107 07/27/20 1121 08/24/20 0632 [id number] Comments Virtual Visit completed. Patient informed on EP and RD appointment and 6 Minute walk test. Patient also informed of patient health questionnaires on My Chart. Patient Verbalizes understanding. Visit diagnosis can be found in [facility]. Completed 6MWT and gym orientation. Initial ITP created and sent for review to Dr. Mark Emily Filbertical Director. First full day of exercise!  Patient was oriented to gym and equipment including functions, settings, policies, and procedures.  Patient's individual exercise prescription and treatment plan were reviewed.  All starting workloads were established based on the results of the 6 minute walk test done at initial orientation visit.  The plan for exercise progression was also introduced and progression will be customized based on patient's performance and goals. 30 Day review completed. Medical Director ITP review done, changes made as directed, and signed approval by Medical Director.  New to program 30 Day  review completed. Medical Director ITP review done, changes made as directed, and signed approval by Medical Director. one visit in April    Row Name 09/15/20 0755 09/19/20 0748 09/21/20 0758 09/29/20 1118 10/11/20 1423   ITP Comments Monica Bush's attendance has been inconsistent since last goal review - unable to get goals for this 30-day review. Monica Bush left message on Thursday.  She has tested positive for COVID.  She will be out again this week.  Since she has not attended, we have not been able to talk to her about her goals and update status. 30 Day review completed. Medical Director ITP review done, changes made as directed, and signed approval by Medical Director.   sporadic attendence  now has Covid Monica Bush has not attended since last review.   She plans to return to class 6/1 Monica Bush continues to have rebound COVID symptoms.  She will be out again this week.  I let her know that her 18 weeks is up in July.   She would be eligible for pulmonary rehab as post COVID.    [place] Name 10/17/20 1634 10/19/20 0907         ITP Comments Unable to get goals this review. Monica Bush has been out with COVID. 30 Day review completed. Medical Director ITP review done, changes made as directed, and signed approval by Medical Director.               Comments:

## 2020-10-19 NOTE — Progress Notes (Signed)
Discharge Progress Report  Patient Details  Name: Monica Bush MRN: [id number] Date of Birth: 10-04-1940 Referring Provider:   Flowsheet Row Cardiac Rehab from 07/14/2020 in [facility] Cardiac and Pulmonary Rehab  Referring Provider Ciro Backer MD        Number of Visits: 10  Reason for Discharge:  Early Exit:  Personal  Smoking History:  Social History   Tobacco Use  Smoking Status Never  Smokeless Tobacco Never    Diagnosis:  Status post coronary artery stent placement  ADL UCSD:   Initial Exercise Prescription:  Initial Exercise Prescription - 07/14/20 1100       Date of Initial Exercise RX and Referring Provider   Date 07/14/20    Referring Provider Ciro Backer MD      Treadmill   MPH 2    Grade 0.5    Minutes 15    METs 2.75      Recumbant Bike   Level 1    RPM 50    Watts 10    Minutes 15    METs 2      NuStep   Level 1    SPM 80    Minutes 15    METs 2      REL-XR   Level 1    Speed 50    Minutes 15    METs 2      T5 Nustep   Level 1    SPM 80    Minutes 15    METs 2      Prescription Details   Frequency (times per week) 3    Duration Progress to 30 minutes of continuous aerobic without signs/symptoms of physical distress      Intensity   THRR 40-80% of Max Heartrate 105-130    Ratings of Perceived Exertion 11-13    Perceived Dyspnea 0-4      Progression   Progression Continue to progress workloads to maintain intensity without signs/symptoms of physical distress.      Resistance Training   Training Prescription Yes    Weight 3 lb    Reps 10-15             Discharge Exercise Prescription (Final Exercise Prescription Changes):  Exercise Prescription Changes - 09/13/20 1400       Response to Exercise   Blood Pressure (Admit) 102/56    Blood Pressure (Exercise) 138/62    Blood Pressure (Exit) 110/62    Heart Rate (Admit) 85 bpm    Heart Rate (Exercise) 103 bpm    Heart Rate (Exit) 76 bpm    Rating of Perceived  Exertion (Exercise) 12    Symptoms none    Duration Continue with 30 min of aerobic exercise without signs/symptoms of physical distress.    Intensity THRR unchanged      Progression   Progression Continue to progress workloads to maintain intensity without signs/symptoms of physical distress.    Average METs 1.8      Resistance Training   Training Prescription Yes    Weight 3 lb    Reps 10-15      Interval Training   Interval Training No      Recumbant Bike   Level 2    Minutes 15      NuStep   Level 2    Minutes 15    METs 1.8      Home Exercise Plan   Plans to continue exercise at Home (comment)   walking, staff videos  Frequency Add 2 additional days to program exercise sessions.    Initial Home Exercises Provided 08/15/20             Functional Capacity:  6 Minute Walk     Row Name 07/14/20 1150         6 Minute Walk   Phase Initial     Distance 1195 feet     Walk Time 6 minutes     # of Rest Breaks 0     MPH 2.26     METS 1.72     RPE 12     Perceived Dyspnea  1     VO2 Peak 6.03     Symptoms Yes (comment)     Comments balance felt off at end     Resting HR 80 bpm     Resting BP 122/80     Resting Oxygen Saturation  96 %     Exercise Oxygen Saturation  during 6 min walk 96 %     Max Ex. HR 100 bpm     Max Ex. BP 126/66     2 Minute Post BP 120/66              Psychological, QOL, Others - Outcomes: PHQ 2/9: Depression screen [facility] 2/9 07/14/2020 01/15/2019 05/29/2018  Decreased Interest 0 0 0  Down, Depressed, Hopeless 0 0 0  PHQ - 2 Score 0 0 0  Altered sleeping 1 1 0  Tired, decreased energy 1 1 1   Change in appetite 1 0 1  Feeling bad or failure about yourself  0 0 1  Trouble concentrating 0 0 0  Moving slowly or fidgety/restless 0 0 0  Suicidal thoughts 0 0 0  PHQ-9 Score 3 2 3   Difficult doing work/chores Somewhat difficult Not difficult at all Somewhat difficult    Quality of Life:  Quality of Life - 07/14/20 1157        Quality of Life   Select Quality of Life      Quality of Life Scores   Health/Function Pre 25.77 %    Socioeconomic Pre 30 %    Psych/Spiritual Pre 30 %    Family Pre 30 %    GLOBAL Pre 28.08 %             Nutrition & Weight - Outcomes:  Pre Biometrics - 07/14/20 1154       Pre Biometrics   Height 5' 1.1" (1.552 m)    Weight 175 lb 6.4 oz (79.6 kg)    BMI (Calculated) 33.03    Single Leg Stand 8.1 seconds              Nutrition:  Nutrition Therapy & Goals - 09/05/20 1154       Nutrition Therapy   Diet Heart healthy, low Na    Drug/Food Interactions Statins/Certain Fruits    Protein (specify units) 65g    Fiber 25 grams    Whole Grain Foods 3 servings    Saturated Fats 12 max. grams    Fruits and Vegetables 5 servings/day    Sodium 1.5 grams      Personal Nutrition Goals   Nutrition Goal ST: Continue to introduce foods slowly LT: figure out what she can or cannot tolerate    Comments She got food poisoning back in March - anti nausea medications and pepcid now since then. She can't eat fried food, she has not eaten cheese, dairy gives her problems, she has not tried  salad again - she has had lettuce and tomato on a sandwich she is hesitent to try yogurt- she has IBS. She is back to eating more fruit (variety). Cooked vegetables are ok - onions are giving her issues. She is taking a probiotic - daily in coffee. She is still trying to limit her sodium and saturated fat,she also continues to eat fruits, vegetables, beans, and lean protein. She will make chicken noodle soup and sauce from scratch with no salt. She also makes scrambled eggs with english muffin and chives in am which she tolerates well. Reviewed heart healthy eating.  Pt should continue slowly introducing foods as she is.             Nutrition Discharge:   Education Questionnaire Score:  Knowledge Questionnaire Score - 07/14/20 1159       Knowledge Questionnaire Score   Pre Score 24/26  Education Focus: Angina, Nutrition             Goals reviewed with patient; copy given to patient.

## 2020-10-19 NOTE — Progress Notes (Signed)
Cardiac Individual Treatment Plan  Patient Details  Name: Monica Bush MRN: [id number] Date of Birth: 12/19/1940 Referring Provider:   Flowsheet Row Cardiac Rehab from 07/14/2020 in [facility] Cardiac and Pulmonary Rehab  Referring Provider Teodoro Kil MD       Initial Encounter Date:  Flowsheet Row Cardiac Rehab from 07/14/2020 in [facility] Cardiac and Pulmonary Rehab  Date 07/14/20       Visit Diagnosis: Status post coronary artery stent placement  Patient's Home Medications on Admission:  Current Outpatient Medications:    acetaminophen (TYLENOL) 500 MG tablet, Take by mouth., Disp: , Rfl:    amLODipine (NORVASC) 10 MG tablet, Take 10 mg by mouth daily., Disp: , Rfl:    amLODipine (NORVASC) 10 MG tablet, Take 1 tablet by mouth daily. (Patient not taking: Reported on 07/07/2020), Disp: , Rfl:    ascorbic acid (VITAMIN C) 100 MG tablet, Take by mouth., Disp: , Rfl:    aspirin 81 MG chewable tablet, Chew 81 mg by mouth daily., Disp: , Rfl:    atorvastatin (LIPITOR) 40 MG tablet, Take 40 mg by mouth daily., Disp: , Rfl:    azelastine (ASTELIN) 0.1 % nasal spray, Place into the nose., Disp: , Rfl:    BRILINTA 90 MG TABS tablet, , Disp: , Rfl:    chlorpheniramine (CHLOR-TRIMETON) 4 MG tablet, Take by mouth., Disp: , Rfl:    Cholecalciferol (VITAMIN D3) 25 MCG (1000 UT) CAPS, Take by mouth., Disp: , Rfl:    clopidogrel (PLAVIX) 75 MG tablet, Take by mouth., Disp: , Rfl:    famotidine (PEPCID) 40 MG tablet, Take by mouth., Disp: , Rfl:    fluticasone (FLONASE) 50 MCG/ACT nasal spray, Place into the nose., Disp: , Rfl:    Multiple Vitamin (MULTI-VITAMIN) tablet, Take 1 tablet by mouth daily., Disp: , Rfl:    nitroGLYCERIN (NITROSTAT) 0.4 MG SL tablet, , Disp: , Rfl:    nitroGLYCERIN (NITROSTAT) 0.4 MG SL tablet, Place 1 tablet under the tongue every 5 (five) minutes as needed. (Patient not taking: Reported on 07/07/2020), Disp: , Rfl:    nystatin ointment (MYCOSTATIN), Apply topically 2 (two) times  daily., Disp: , Rfl:    STUDY - ASPIRE - aspirin 81 mg or placebo tablet (PI-Sethi), Take 1 tablet by mouth daily., Disp: , Rfl:   Past Medical History: Past Medical History:  Diagnosis Date   Anxiety    Asthma    CAD (coronary artery disease)    Hyperlipemia    Hypertension    Osteopetrosis    Reflux esophagitis     Tobacco Use: Social History   Tobacco Use  Smoking Status Never  Smokeless Tobacco Never    Labs: Recent Review Flowsheet Data   There is no flowsheet data to display.      Exercise Target Goals: Exercise Program Goal: Individual exercise prescription set using results from initial 6 min walk test and THRR while considering  patient's activity barriers and safety.   Exercise Prescription Goal: Initial exercise prescription builds to 30-45 minutes a day of aerobic activity, 2-3 days per week.  Home exercise guidelines will be given to patient during program as part of exercise prescription that the participant will acknowledge.   Education: Aerobic Exercise: - Group verbal and visual presentation on the components of exercise prescription. Introduces F.I.T.T principle from ACSM for exercise prescriptions.  Reviews F.I.T.T. principles of aerobic exercise including progression. Written material given at graduation.   Education: Resistance Exercise: - Group verbal and visual presentation on the components  of exercise prescription. Introduces F.I.T.T principle from ACSM for exercise prescriptions  Reviews F.I.T.T. principles of resistance exercise including progression. Written material given at graduation.    Education: Exercise & Equipment Safety: - Individual verbal instruction and demonstration of equipment use and safety with use of the equipment. Flowsheet Row Cardiac Rehab from 07/14/2020 in [facility] Cardiac and Pulmonary Rehab  Date 07/07/20  Educator [facility]  Instruction Review Code 1- Verbalizes Understanding       Education: Exercise Physiology &  General Exercise Guidelines: - Group verbal and written instruction with models to review the exercise physiology of the cardiovascular system and associated critical values. Provides general exercise guidelines with specific guidelines to those with heart or lung disease.    Education: Flexibility, Balance, Mind/Body Relaxation: - Group verbal and visual presentation with interactive activity on the components of exercise prescription. Introduces F.I.T.T principle from ACSM for exercise prescriptions. Reviews F.I.T.T. principles of flexibility and balance exercise training including progression. Also discusses the mind body connection.  Reviews various relaxation techniques to help reduce and manage stress (i.e. Deep breathing, progressive muscle relaxation, and visualization). Balance handout provided to take home. Written material given at graduation.   Activity Barriers & Risk Stratification:  Activity Barriers & Cardiac Risk Stratification - 07/14/20 1151       Activity Barriers & Cardiac Risk Stratification   Activity Barriers Deconditioning;Muscular Weakness;Balance Concerns    Cardiac Risk Stratification Moderate             6 Minute Walk:  6 Minute Walk     Row Name 07/14/20 1150         6 Minute Walk   Phase Initial     Distance 1195 feet     Walk Time 6 minutes     # of Rest Breaks 0     MPH 2.26     METS 1.72     RPE 12     Perceived Dyspnea  1     VO2 Peak 6.03     Symptoms Yes (comment)     Comments balance felt off at end     Resting HR 80 bpm     Resting BP 122/80     Resting Oxygen Saturation  96 %     Exercise Oxygen Saturation  during 6 min walk 96 %     Max Ex. HR 100 bpm     Max Ex. BP 126/66     2 Minute Post BP 120/66              Oxygen Initial Assessment:   Oxygen Re-Evaluation:   Oxygen Discharge (Final Oxygen Re-Evaluation):   Initial Exercise Prescription:  Initial Exercise Prescription - 07/14/20 1100       Date of  Initial Exercise RX and Referring Provider   Date 07/14/20    Referring Provider Teodoro Kil MD      Treadmill   MPH 2    Grade 0.5    Minutes 15    METs 2.75      Recumbant Bike   Level 1    RPM 50    Watts 10    Minutes 15    METs 2      NuStep   Level 1    SPM 80    Minutes 15    METs 2      REL-XR   Level 1    Speed 50    Minutes 15    METs 2  T5 Nustep   Level 1    SPM 80    Minutes 15    METs 2      Prescription Details   Frequency (times per week) 3    Duration Progress to 30 minutes of continuous aerobic without signs/symptoms of physical distress      Intensity   THRR 40-80% of Max Heartrate 105-130    Ratings of Perceived Exertion 11-13    Perceived Dyspnea 0-4      Progression   Progression Continue to progress workloads to maintain intensity without signs/symptoms of physical distress.      Resistance Training   Training Prescription Yes    Weight 3 lb    Reps 10-15             Perform Capillary Blood Glucose checks as needed.  Exercise Prescription Changes:   Exercise Prescription Changes     Row Name 07/14/20 1100 07/19/20 0800 08/15/20 1100 08/29/20 1300 09/13/20 1400     Response to Exercise   Blood Pressure (Admit) 122/80 126/64 102/62 112/62 102/56   Blood Pressure (Exercise) 126/66 122/62 128/70 122/60 138/62   Blood Pressure (Exit) 120/66 102/60 106/60 110/60 110/62   Heart Rate (Admit) 80 bpm 86 bpm 76 bpm 81 bpm 85 bpm   Heart Rate (Exercise) 100 bpm 112 bpm 108 bpm 111 bpm 103 bpm   Heart Rate (Exit) 71 bpm 93 bpm 77 bpm 84 bpm 76 bpm   Oxygen Saturation (Admit) 96 % -- -- -- --   Oxygen Saturation (Exercise) 96 % -- -- -- --   Rating of Perceived Exertion (Exercise) _0 Perceived Dyspnea (Exercise) 1 -- -- -- --   Symptoms balance off at end none none none none   Comments walk test results first full day of exercise -- -- --   Duration -- Continue with 30 min of aerobic exercise without  signs/symptoms of physical distress. Continue with 30 min of aerobic exercise without signs/symptoms of physical distress. Continue with 30 min of aerobic exercise without signs/symptoms of physical distress. Continue with 30 min of aerobic exercise without signs/symptoms of physical distress.   Intensity -- THRR unchanged THRR unchanged THRR unchanged THRR unchanged     Progression   Progression -- Continue to progress workloads to maintain intensity without signs/symptoms of physical distress. Continue to progress workloads to maintain intensity without signs/symptoms of physical distress. Continue to progress workloads to maintain intensity without signs/symptoms of physical distress. Continue to progress workloads to maintain intensity without signs/symptoms of physical distress.   Average METs -- 2.6 2.22 2.75 1.8     Resistance Training   Training Prescription -- Yes Yes Yes Yes   Weight -- 3 lb 3 lb 3 lb 3 lb   Reps -- 10-15 10-15 10-15 10-15     Interval Training   Interval Training -- No No No No     Treadmill   MPH -- -- 2 2 --   Grade -- -- 0.5 0.5 --   Minutes -- -- 15 15 --   METs -- -- 2.67 2.67 --     Recumbant Bike   Level -- 3 -- 3 2   Minutes -- 15 -- 15 15   METs -- 2.9 -- 2.8 --     NuStep   Level -- 3 -- -- 2   Minutes -- 15 -- -- 15   METs -- 2.3 -- -- 1.8     REL-XR  Level -- -- 5 -- --   Minutes -- -- 15 -- --   METs -- -- 2.2 -- --     T5 Nustep   Level -- -- 2 -- --   Minutes -- -- 15 -- --   METs -- -- 1.8 -- --     Home Exercise Plan   Plans to continue exercise at -- -- Home (comment)  walking, staff videos Home (comment)  walking, staff videos Home (comment)  walking, staff videos   Frequency -- -- Add 2 additional days to program exercise sessions. Add 2 additional days to program exercise sessions. Add 2 additional days to program exercise sessions.   Initial Home Exercises Provided -- -- 08/15/20 08/15/20 08/15/20             Exercise Comments:   Exercise Goals and Review:   Exercise Goals     Row Name 07/14/20 1153             Exercise Goals   Increase Physical Activity Yes       Intervention Provide advice, education, support and counseling about physical activity/exercise needs.;Develop an individualized exercise prescription for aerobic and resistive training based on initial evaluation findings, risk stratification, comorbidities and participant's personal goals.       Expected Outcomes Short Term: Attend rehab on a regular basis to increase amount of physical activity.;Long Term: Add in home exercise to make exercise part of routine and to increase amount of physical activity.;Long Term: Exercising regularly at least 3-5 days a week.       Increase Strength and Stamina Yes       Intervention Provide advice, education, support and counseling about physical activity/exercise needs.;Develop an individualized exercise prescription for aerobic and resistive training based on initial evaluation findings, risk stratification, comorbidities and participant's personal goals.       Expected Outcomes Short Term: Increase workloads from initial exercise prescription for resistance, speed, and METs.;Short Term: Perform resistance training exercises routinely during rehab and add in resistance training at home;Long Term: Improve cardiorespiratory fitness, muscular endurance and strength as measured by increased METs and functional capacity (6MWT)       Able to understand and use rate of perceived exertion (RPE) scale Yes       Intervention Provide education and explanation on how to use RPE scale       Expected Outcomes Short Term: Able to use RPE daily in rehab to express subjective intensity level;Long Term:  Able to use RPE to guide intensity level when exercising independently       Able to understand and use Dyspnea scale Yes       Intervention Provide education and explanation on how to use Dyspnea scale        Expected Outcomes Long Term: Able to use Dyspnea scale to guide intensity level when exercising independently;Short Term: Able to use Dyspnea scale daily in rehab to express subjective sense of shortness of breath during exertion       Knowledge and understanding of Target Heart Rate Range (THRR) Yes       Intervention Provide education and explanation of THRR including how the numbers were predicted and where they are located for reference       Expected Outcomes Short Term: Able to use daily as guideline for intensity in rehab;Short Term: Able to state/look up THRR;Long Term: Able to use THRR to govern intensity when exercising independently       Able to check pulse independently  Yes       Intervention Provide education and demonstration on how to check pulse in carotid and radial arteries.;Review the importance of being able to check your own pulse for safety during independent exercise       Expected Outcomes Short Term: Able to explain why pulse checking is important during independent exercise;Long Term: Able to check pulse independently and accurately       Understanding of Exercise Prescription Yes       Intervention Provide education, explanation, and written materials on patient's individual exercise prescription       Expected Outcomes Short Term: Able to explain program exercise prescription;Long Term: Able to explain home exercise prescription to exercise independently                Exercise Goals Re-Evaluation :  Exercise Goals Re-Evaluation     Row Name 07/15/20 1108 07/19/20 0849 08/15/20 1106 08/29/20 1340 09/13/20 1359     Exercise Goal Re-Evaluation   Exercise Goals Review Increase Physical Activity;Able to understand and use rate of perceived exertion (RPE) scale;Knowledge and understanding of Target Heart Rate Range (THRR);Understanding of Exercise Prescription;Increase Strength and Stamina;Able to understand and use Dyspnea scale;Able to check pulse independently  Increase Physical Activity;Increase Strength and Stamina;Understanding of Exercise Prescription Increase Physical Activity;Increase Strength and Stamina;Understanding of Exercise Prescription Increase Physical Activity;Increase Strength and Stamina Increase Physical Activity;Increase Strength and Stamina;Understanding of Exercise Prescription   Comments Reviewed RPE and dyspnea scales, THR and program prescription with pt today.  Pt voiced understanding and was given a copy of goals to take home. Only attended first day so far.  We will continue to monitor Britton is back!  She is feeling pretty good again, finally.  She has not gotten into her exercise at home yet.  Updated home exercise today, [place] plans to walk and use staff vidoes for exercise at home.  Reviewed scales and THR again. Rochanda has been attending once per week since she returned.  Consistent attendance will yield better results. Gentry continues to struggle with attendance.  Last week her stomach flared up again and this week she had an appointment.  We will continue to encourage improved attendance.  She is on level 2 on the bike.  We will continue to monitor her progress.   Expected Outcomes Short: Use RPE daily to regulate intensity. Long: Follow program prescription in THR. Short: Attend rehab regularly Long: contiue to follow program prescription Short: Back into routine exercise Long: Continue to improve stamina. Short:  attend consistently Long:  improve stamina and MET level Short: Consistent attendance Long: Continue to improve stamina    Row Name 10/11/20 1426             Exercise Goal Re-Evaluation   Comments Out since last review                Discharge Exercise Prescription (Final Exercise Prescription Changes):  Exercise Prescription Changes - 09/13/20 1400       Response to Exercise   Blood Pressure (Admit) 102/56    Blood Pressure (Exercise) 138/62    Blood Pressure (Exit) 110/62    Heart Rate (Admit) 85 bpm     Heart Rate (Exercise) 103 bpm    Heart Rate (Exit) 76 bpm    Rating of Perceived Exertion (Exercise) 12    Symptoms none    Duration Continue with 30 min of aerobic exercise without signs/symptoms of physical distress.    Intensity THRR unchanged  Progression   Progression Continue to progress workloads to maintain intensity without signs/symptoms of physical distress.    Average METs 1.8      Resistance Training   Training Prescription Yes    Weight 3 lb    Reps 10-15      Interval Training   Interval Training No      Recumbant Bike   Level 2    Minutes 15      NuStep   Level 2    Minutes 15    METs 1.8      Home Exercise Plan   Plans to continue exercise at Home (comment)   walking, staff videos   Frequency Add 2 additional days to program exercise sessions.    Initial Home Exercises Provided 08/15/20             Nutrition:  Target Goals: Understanding of nutrition guidelines, daily intake of sodium <1525m, cholesterol <2031m calories 30% from fat and 7% or less from saturated fats, daily to have 5 or more servings of fruits and vegetables.  Education: All About Nutrition: -Group instruction provided by verbal, written material, interactive activities, discussions, models, and posters to present general guidelines for heart healthy nutrition including fat, fiber, MyPlate, the role of sodium in heart healthy nutrition, utilization of the nutrition label, and utilization of this knowledge for meal planning. Follow up email sent as well. Written material given at graduation. [facility]  Education need identified 07/14/20       Biometrics:  Pre Biometrics - 07/14/20 1154       Pre Biometrics   Height 5' 1.1" (1.552 m)    Weight 175 lb 6.4 oz (79.6 kg)    BMI (Calculated) 33.03    Single Leg Stand 8.1 seconds              Nutrition Therapy Plan and Nutrition Goals:  Nutrition  Therapy & Goals - 09/05/20 1154       Nutrition Therapy   Diet Heart healthy, low Na    Drug/Food Interactions Statins/Certain Fruits    Protein (specify units) 65g    Fiber 25 grams    Whole Grain Foods 3 servings    Saturated Fats 12 max. grams    Fruits and Vegetables 5 servings/day    Sodium 1.5 grams      Personal Nutrition Goals   Nutrition Goal ST: Continue to introduce foods slowly LT: figure out what she can or cannot tolerate    Comments She got food poisoning back in March - anti nausea medications and pepcid now since then. She can't eat fried food, she has not eaten cheese, dairy gives her problems, she has not tried salad again - she has had lettuce and tomato on a sandwich she is hesitent to try yogurt- she has IBS. She is back to eating more fruit (variety). Cooked vegetables are ok - onions are giving her issues. She is taking a probiotic - daily in coffee. She is still trying to limit her sodium and saturated fat,she also continues to eat fruits, vegetables, beans, and lean protein. She will make chicken noodle soup and sauce from scratch with no salt. She also makes scrambled eggs with english muffin and chives in am which she tolerates well. Reviewed heart healthy eating.  Pt should continue slowly introducing foods as she is.             Nutrition Assessments:  MEDIFICTS  Score Key: ?70 Need to make dietary changes  40-70 Heart Healthy Diet ? 40 Therapeutic Level Cholesterol Diet  Flowsheet Row Cardiac Rehab from 07/14/2020 in [facility] Cardiac and Pulmonary Rehab  Picture Your Plate Total Score on Admission 62      Picture Your Plate Scores: <02 Unhealthy dietary pattern with much room for improvement. 41-50 Dietary pattern unlikely to meet recommendations for good health and room for improvement. 51-60 More healthful dietary pattern, with some room for improvement.  >60 Healthy dietary pattern, although there may be some specific behaviors that could be  improved.    Nutrition Goals Re-Evaluation:  Nutrition Goals Re-Evaluation     [place] Name 08/15/20 1119             Goals   Nutrition Goal Meet with dietician       Comment Saniyah has just started to get her appetite back after stomach issues.  She will set up an appointment with dietician.  She is trying to stay away from dairy to help.       Expected Outcome Meet with dietician                Nutrition Goals Discharge (Final Nutrition Goals Re-Evaluation):  Nutrition Goals Re-Evaluation - 08/15/20 1119       Goals   Nutrition Goal Meet with dietician    Comment Lulubelle has just started to get her appetite back after stomach issues.  She will set up an appointment with dietician.  She is trying to stay away from dairy to help.    Expected Outcome Meet with dietician             Psychosocial: Target Goals: Acknowledge presence or absence of significant depression and/or stress, maximize coping skills, provide positive support system. Participant is able to verbalize types and ability to use techniques and skills needed for reducing stress and depression.   Education: Stress, Anxiety, and Depression - Group verbal and visual presentation to define topics covered.  Reviews how body is impacted by stress, anxiety, and depression.  Also discusses healthy ways to reduce stress and to treat/manage anxiety and depression.  Written material given at graduation. Flowsheet Row Cardiac Rehab from 07/10/2018 in [facility] Cardiac and Pulmonary Rehab  Date 07/08/18  Educator [facility]  Instruction Review Code 1- [employer] Understanding       Education: Sleep Hygiene -Provides group verbal and written instruction about how sleep can affect your health.  Define sleep hygiene, discuss sleep cycles and impact of sleep habits. Review good sleep hygiene tips.  Flowsheet Row Cardiac Rehab from 07/10/2018 in [facility] Cardiac and Pulmonary Rehab  Date 06/10/18  Educator [facility]  Instruction Review Code 1-  Verbalizes Understanding       Initial Review & Psychosocial Screening:  Initial Psych Review & Screening - 07/07/20 1008       Initial Review   Current issues with None Identified      Family Dynamics   Good Support System? Yes    Comments Her step daughter is great for support. Her niece is a great support system. She lives alone and has a positive outlook on her health.      Barriers   Psychosocial barriers to participate in program There are no identifiable barriers or psychosocial needs.;The patient should benefit from training in stress management and relaxation.      Screening Interventions   Interventions Encouraged to exercise;Provide feedback about the scores to participant;To provide support and resources with identified psychosocial needs  Expected Outcomes Short Term goal: Utilizing psychosocial counselor, staff and physician to assist with identification of specific Stressors or current issues interfering with healing process. Setting desired goal for each stressor or current issue identified.;Long Term Goal: Stressors or current issues are controlled or eliminated.;Short Term goal: Identification and review with participant of any Quality of Life or Depression concerns found by scoring the questionnaire.;Long Term goal: The participant improves quality of Life and PHQ9 Scores as seen by post scores and/or verbalization of changes             Quality of Life Scores:   Quality of Life - 07/14/20 1157       Quality of Life   Select Quality of Life      Quality of Life Scores   Health/Function Pre 25.77 %    Socioeconomic Pre 30 %    Psych/Spiritual Pre 30 %    Family Pre 30 %    GLOBAL Pre 28.08 %            Scores of 19 and below usually indicate a poorer quality of life in these areas.  A difference of  2-3 points is a clinically meaningful difference.  A difference of 2-3 points in the total score of the Quality of Life Index has been associated with  significant improvement in overall quality of life, self-image, physical symptoms, and general health in studies assessing change in quality of life.  PHQ-9: Recent Review Flowsheet Data     Depression screen [facility] 2/9 07/14/2020 01/15/2019 05/29/2018   Decreased Interest 0 0 0   Down, Depressed, Hopeless 0 0 0   PHQ - 2 Score 0 0 0   Altered sleeping 1 1 0   Tired, decreased energy _0 Change in appetite 1 0 1   Feeling bad or failure about yourself  0 0 1   Trouble concentrating 0 0 0   Moving slowly or fidgety/restless 0 0 0   Suicidal thoughts 0 0 0   PHQ-9 Score _1 Difficult doing work/chores Somewhat difficult Not difficult at all Somewhat difficult      Interpretation of Total Score  Total Score Depression Severity:  1-4 = Minimal depression, 5-9 = Mild depression, 10-14 = Moderate depression, 15-19 = Moderately severe depression, 20-27 = Severe depression   Psychosocial Evaluation and Intervention:  Psychosocial Evaluation - 07/07/20 1010       Psychosocial Evaluation & Interventions   Interventions Encouraged to exercise with the program and follow exercise prescription;Stress management education;Relaxation education    Comments Her step daughter is great for support. Her niece is a great support system. She lives alone and has a positive outlook on her health.    Expected Outcomes Short: Exercise regularly to support mental health and notify staff of any changes. Long: maintain mental health and well being through teaching of rehab or prescribed medications independently.    Continue Psychosocial Services  Follow up required by staff             Psychosocial Re-Evaluation:  Psychosocial Re-Evaluation     [place] Name 08/15/20 1113             Psychosocial Re-Evaluation   Current issues with Current Stress Concerns;Current Depression       Comments Breauna is back in rehab. She has good days and bad days, but gets through it.  Her boyfriend has been  recently diagnosised with Non-Hodgkins lymphoma and undergoing treatment.  He has his PET scan today and follows up with doctor on Wednesdays and also getting his COVID shots.  They will determine course of treatment based off results.  Her meds make her fatigued so she is sleeping well and naps for about 1-2 hrs during the afternoon.  She is doing the best she can.  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  She has started a new medication for it and is finally starting to feel better.       Expected Outcomes Short: Continue to cope with boyfriend's cancer Long: Continue to focus on positive and taking care of herself.       Interventions Encouraged to attend Cardiac Rehabilitation for the exercise;Stress management education       Continue Psychosocial Services  Follow up required by staff                Psychosocial Discharge (Final Psychosocial Re-Evaluation):  Psychosocial Re-Evaluation - 08/15/20 1113       Psychosocial Re-Evaluation   Current issues with Current Stress Concerns;Current Depression    Comments Oyuki is back in rehab. She has good days and bad days, but gets through it.  Her boyfriend has been recently diagnosised with Non-Hodgkins lymphoma and undergoing treatment.  He has his PET scan today and follows up with doctor on Wednesdays and also getting his COVID shots.  They will determine course of treatment based off results.  Her meds make her fatigued so she is sleeping well and naps for about 1-2 hrs during the afternoon.  She is doing the best she can.  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  She has started a new medication for it and is finally starting to feel better.    Expected Outcomes Short: Continue to cope with boyfriend's cancer Long: Continue to focus on positive and taking care of herself.    Interventions Encouraged to attend Cardiac Rehabilitation for the exercise;Stress  management education    Continue Psychosocial Services  Follow up required by staff             Vocational Rehabilitation: Provide vocational rehab assistance to qualifying candidates.   Vocational Rehab Evaluation & Intervention:   Education: Education Goals: Education classes will be provided on a variety of topics geared toward better understanding of heart health and risk factor modification. Participant will state understanding/return demonstration of topics presented as noted by education test scores.  Learning Barriers/Preferences:  Learning Barriers/Preferences - 07/07/20 1007       Learning Barriers/Preferences   Learning Barriers None    Learning Preferences None             General Cardiac Education Topics:  AED/CPR: - Group verbal and written instruction with the use of models to demonstrate the basic use of the AED with the basic ABC's of resuscitation.   Anatomy and Cardiac Procedures: - Group verbal and visual presentation and models provide information about basic cardiac anatomy and function. Reviews the testing methods done to diagnose heart disease and the outcomes of the test results. Describes the treatment choices: Medical Management, Angioplasty, or Coronary Bypass Surgery for treating various heart conditions including Myocardial Infarction, Angina, Valve Disease, and Cardiac Arrhythmias.  Written material given at graduation. Flowsheet Row Cardiac Rehab from 07/14/2020 in [facility] Cardiac and Pul[facility] Rehab  Education need identified 07/14/20       Medication Safety: - Group verbal and visual instruction  to review commonly prescribed medications for heart and lung disease. Reviews the medication, class of the drug, and side effects. Includes the steps to properly store meds and maintain the prescription regimen.  Written material given at graduation.   Intimacy: - Group verbal instruction through game format to discuss how heart and lung disease  can affect sexual intimacy. Written material given at graduation.. Flowsheet Row Cardiac Rehab from 07/10/2018 in [facility] Cardiac [facility] Rehab  Date 07/03/18  Educator [facility]  Instruction Review Code 1- Verbalizes Understanding       Know Your Numbers and Heart Failure: - Group verbal and visual instruction to discuss disease risk factors for cardiac and pulmonary disease and treatment options.  Reviews associated critical values for Overweight/Obesity, Hypertension, Cholesterol, and Diabetes.  Discusses basics of heart failure: signs/symptoms and treatments.  Introduces Heart Failure Zone chart for action plan for heart failure.  Written material given at graduation.   Infection Prevention: - Provides verbal and written material to individual with discussion of infection control including proper hand washing and proper equipment cleaning during exercise session. Flowsheet Row Cardiac Rehab from 07/14/2020 in [facility] Cardiac and Pulmonary Rehab  Date 07/07/20  Educator [facility]  Instruction Review Code 1- Verbalizes Understanding       Falls Prevention: - Provides verbal and written material to individual with discussion of falls prevention and safety. Flowsheet Row Cardiac Rehab from 07/14/2020 in [facility] Cardiac and Pulmonary Rehab  Date 07/07/20  Educator [facility]  Instruction Review Code 1- Verbalizes Understanding       Other: -Provides group and verbal instruction on various topics (see comments)   Knowledge Questionnaire Score:  Knowledge Questionnaire Score - 07/14/20 1159       Knowledge Questionnaire Score   Pre Score 24/26 Education Focus: Angina, Nutrition             Core Components/Risk Factors/Patient Goals at Admission:  Personal Goals and Risk Factors at Admission - 07/14/20 1159       Core Components/Risk Factors/Patient Goals on Admission    Weight Management Yes;Weight Loss;Obesity    Intervention Weight Management: Develop a combined nutrition and exercise  program designed to reach desired caloric intake, while maintaining appropriate intake of nutrient and fiber, sodium and fats, and appropriate energy expenditure required for the weight goal.;Weight Management: Provide education and appropriate resources to help participant work on and attain dietary goals.;Weight Management/Obesity: Establish reasonable short term and long term weight goals.;Obesity: Provide education and appropriate resources to help participant work on and attain dietary goals.    Admit Weight 175 lb 6.4 oz (79.6 kg)    Goal Weight: Short Term 170 lb (77.1 kg)    Goal Weight: Long Term 165 lb (74.8 kg)    Expected Outcomes Short Term: Continue to assess and modify interventions until short term weight is achieved;Long Term: Adherence to nutrition and physical activity/exercise program aimed toward attainment of established weight goal;Weight Loss: Understanding of general recommendations for a balanced deficit meal plan, which promotes 1-2 lb weight loss per week and includes a negative energy balance of [phone] kcal/d;Understanding recommendations for meals to include 15-35% energy as protein, 25-35% energy from fat, 35-60% energy from carbohydrates, less than 221m of dietary cholesterol, 20-35 gm of total fiber daily;Understanding of distribution of calorie intake throughout the day with the consumption of 4-5 meals/snacks    Hypertension Yes    Intervention Provide education on lifestyle modifcations including regular physical activity/exercise, weight management, moderate sodium restriction and increased consumption of fresh  fruit, vegetables, and low fat dairy, alcohol moderation, and smoking cessation.;Monitor prescription use compliance.    Expected Outcomes Short Term: Continued assessment and intervention until BP is < 140/49m HG in hypertensive participants. < 130/817mHG in hypertensive participants with diabetes, heart failure or chronic kidney disease.;Long Term:  Maintenance of blood pressure at goal levels.    Lipids Yes    Intervention Provide education and support for participant on nutrition & aerobic/resistive exercise along with prescribed medications to achieve LDL <7037mHDL >2m22m  Expected Outcomes Short Term: Participant states understanding of desired cholesterol values and is compliant with medications prescribed. Participant is following exercise prescription and nutrition guidelines.;Long Term: Cholesterol controlled with medications as prescribed, with individualized exercise RX and with personalized nutrition plan. Value goals: LDL < 70mg96mL > 40 mg.             Education:Diabetes - Individual verbal and written instruction to review signs/symptoms of diabetes, desired ranges of glucose level fasting, after meals and with exercise. Acknowledge that pre and post exercise glucose checks will be done for 3 sessions at entry of program.   Core Components/Risk Factors/Patient Goals Review:   Goals and Risk Factor Review     Row Name 08/15/20 1122             Core Components/Risk Factors/Patient Goals Review   Personal Goals Review Weight Management/Obesity;Hypertension       Review NancyTonekastarted a new medication for her stomach.  She is starting to feel better now.  She did lose some weight while not feeling well, but she is also eating better now with more fruit so it is staying down.  Her blood pressures have been doing okay and she checks them at home.                Core Components/Risk Factors/Patient Goals at Discharge (Final Review):   Goals and Risk Factor Review - 08/15/20 1122       Core Components/Risk Factors/Patient Goals Review   Personal Goals Review Weight Management/Obesity;Hypertension    Review NancyKendyllstarted a new medication for her stomach.  She is starting to feel better now.  She did lose some weight while not feeling well, but she is also eating better now with more fruit so it is staying  down.  Her blood pressures have been doing okay and she checks them at home.             ITP Comments:  ITP Comments     Row Name 07/07/20 1007 07/14/20 1150 07/15/20 1107 07/27/20 1121 08/24/20 0632 [id number] Comments Virtual Visit completed. Patient informed on EP and RD appointment and 6 Minute walk test. Patient also informed of patient health questionnaires on My Chart. Patient Verbalizes understanding. Visit diagnosis can be found in [facility]. Completed 6MWT and gym orientation. Initial ITP created and sent for review to Dr. Mark Emily Filbertical Director. First full day of exercise!  Patient was oriented to gym and equipment including functions, settings, policies, and procedures.  Patient's individual exercise prescription and treatment plan were reviewed.  All starting workloads were established based on the results of the 6 minute walk test done at initial orientation visit.  The plan for exercise progression was also introduced and progression will be customized based on patient's performance and goals. 30 Day review completed. Medical Director ITP review done, changes made as directed, and signed approval by Medical Director.  New to program 30 Day  review completed. Medical Director ITP review done, changes made as directed, and signed approval by Medical Director. one visit in April    Row Name 09/15/20 0755 09/19/20 0748 09/21/20 0758 09/29/20 1118 10/11/20 1423   ITP Comments Enzley's attendance has been inconsistent since last goal review - unable to get goals for this 30-day review. Earlee left message on Thursday.  She has tested positive for COVID.  She will be out again this week.  Since she has not attended, we have not been able to talk to her about her goals and update status. 30 Day review completed. Medical Director ITP review done, changes made as directed, and signed approval by Medical Director.   sporadic attendence  now has Covid Leina has not attended since last review.   She plans to return to class 6/1 Jillann continues to have rebound COVID symptoms.  She will be out again this week.  I let her know that her 18 weeks is up in July.   She would be eligible for pulmonary rehab as post COVID.    [place] Name 10/17/20 1634 10/19/20 0907 10/19/20 1326       ITP Comments Unable to get goals this review. Kadra has been out with COVID. 30 Day review completed. Medical Director ITP review done, changes made as directed, and signed approval by Medical Director. Dannell has been out with COVID and is still not feeling well and would like to discharge at this time. She is interested in coming back when she feels better with a new referral.              Comments: Discharge ITP

## 2021-09-05 ENCOUNTER — Ambulatory Visit: Payer: Medicare Other | Attending: Family Medicine | Admitting: Physical Therapy

## 2021-09-05 ENCOUNTER — Encounter: Payer: Self-pay | Admitting: Physical Therapy

## 2021-09-05 DIAGNOSIS — M6281 Muscle weakness (generalized): Secondary | ICD-10-CM | POA: Insufficient documentation

## 2021-09-05 DIAGNOSIS — R278 Other lack of coordination: Secondary | ICD-10-CM | POA: Diagnosis present

## 2021-09-05 DIAGNOSIS — R2681 Unsteadiness on feet: Secondary | ICD-10-CM | POA: Insufficient documentation

## 2021-09-05 DIAGNOSIS — R269 Unspecified abnormalities of gait and mobility: Secondary | ICD-10-CM | POA: Diagnosis present

## 2021-09-05 DIAGNOSIS — R42 Dizziness and giddiness: Secondary | ICD-10-CM | POA: Insufficient documentation

## 2021-09-05 NOTE — Therapy (Signed)
?[facility] PHYSICAL [facility] MEDICINE ?[address] ?[place], [place], [address] ?Phone: [phone]   Fax:  [phone] ? ?Physical Therapy Evaluation ? ?Patient Details  ?Name: Monica Bush ?MRN: [id number] ?Date of Birth: 04/06/1941 ?No data recorded ? ?Encounter Date: 09/05/2021 ? ? PT End of Session - 09/05/21 1824   ? ? Visit Number 1   ? Number of Visits 25   ? Date for PT Re-Evaluation 11/28/21   ? Progress Note Due on Visit 10   ? PT Start Time 1046   ? PT Stop Time 1130   ? PT Time Calculation (min) 44 min   ? Activity Tolerance Patient tolerated treatment well   ? Behavior [facility] Therapy [facility] for tasks assessed/performed   ? ?  ?  ? ?  ? ? ?Past Medical History:  ?Diagnosis Date  ? Anxiety   ? Asthma   ? CAD (coronary artery disease)   ? Hyperlipemia   ? Hypertension   ? Osteopetrosis   ? Reflux esophagitis   ? ? ?Past Surgical History:  ?Procedure Laterality Date  ? SHOULDER SURGERY    ? ? ?There were no vitals filed for this visit. ? ? ? Subjective Assessment - 09/05/21 1100   ? ? Subjective balance, gait, strength, UE coordination s/p L hemorrhagic temporal stroke   ? Pertinent History Pt reports impaired balance, changes in gait and posture, decreased coordination and symptoms of vestibular disorder after experiencing a small stroke in February of 2023. Stroke went unidentified until an MRI was performed at th end of March. 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 To note, pt has "long covid" in which covid symptoms reappear about every 4 weeks and last 24-48 hours; this has been ongoing since May 2022. Symptoms include headache, body aches and nausea. Since February, pt becomes dizzy (described as spinning) when lying on her left side - states dizziness does not fully resolve for the duration of lying in that position. MD prescribed medication for this. Pt reports feeling more fatigued than usual, sometimes sleeping >79 hours and taking naps during the  day. Fatigue/low energy limit her ability to perform hobbies such as gardening, travelling and cooking. She also mentions intermittent vision changes in which vision becomes blurry - this started ~1 week ago. Occasional word-finding difficulty. Pt reports feeling weak, "like my strength has just been pulled out of me." She does report small, short-duration bursts of energy. PMH includes: anxiety, CAD, HTN, osteopetrosis, PTCA (percutaneous transluminal coronary angioplasty), long-covid and stroke.   ? Limitations Reading;Lifting;Standing;Walking;House hold activities   ? How long can you sit comfortably? unlimited   ? How long can you stand comfortably? 3 to 15 minutes (dependent on day)   ? How long can you walk comfortably? ranges between 9950ft to 56500ft (dependent on day)   ? Diagnostic tests MRI "Single microhemorrhage in the left inferior medial temporal lobe."   ? Patient Stated Goals would like to decrease dizziness, improve balance and endurance   ? Currently in Pain? No/denies   ? ?  ?  ? ?  ? ? ? ? ?SUBJECTIVE ?Chief complaint: Pt reports impaired balance, changes in gait and posture, decreased coordination and symptoms of vestibular disorder after experiencing a small stroke in February of 2023. Stroke went unidentified until an MRI was performed at th end of March. 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 To note, pt has "long covid" in which covid symptoms reappear about every 4 weeks and  last 24-48 hours; this has been ongoing since May 2022. Symptoms include headache, body aches and nausea. Since February, pt becomes dizzy (described as spinning) when lying on her left side - states dizziness does not fully resolve for the duration of lying in that position. MD prescribed medication for this. Pt reports feeling more fatigued than usual, sometimes sleeping >13 hours and taking naps during the day. Fatigue/low energy limit her ability to perform hobbies such as gardening, travelling and  cooking. She also mentions intermittent vision changes in which vision becomes blurry - this started ~1 week ago. Occasional word-finding difficulty. Pt reports feeling weak, "like my strength has just been pulled out of me." She does report small, short-duration bursts of energy. PMH includes: anxiety, CAD, HTN, osteopetrosis, PTCA (percutaneous transluminal coronary angioplasty), long-covid and stroke. ? ?Red flags: negative  ?Referring Dx: difficulty with gait and balance  ?Referring Provider: Cathie Hoops ?Recent changes in overall health/medication: Stroke in February 2023 leading to multiple system changes - see above. ?Imaging: MRI "Single microhemorrhage in the left inferior medial temporal lobe."  ?Prior history of physical therapy for balance: None - she did have previous PT for right shoulder (RC). ?Follow-up appointment with MD: Yes - neurology on 09/14/21 ?Dominant hand: right ?Falls in the last 6 months: No  ?Hobbies: read, garden, cook, travel  ?Goals: would like to decrease dizziness, improve balance and endurance. ? ? ?OBJECTIVE ? ?Musculoskeletal ?Tremor: Absent ?Bulk: Normal ?Tone: Normal, no clonus ? ? ?Posture ?Forward head. Leftward lean in sitting, standing and walking.  ? ? ?Gait ?Leftward lean. Wide BOS.  ? ? ? ?Strength ?R/L (lower extremity) ?4-/4- Hip flexion ?3/3 Hip extension  ?4/4 Hip abduction ?4/4- Knee extension ?4-/4- Knee flexion ?5/5 Ankle Dorsiflexion ? ?R/L (upper extremity) ?4/4- Shoulder flexion ?4/4- Shoulder abduction  ?4/4- IR ?4-/4- ER ?4+/4 Bicep ?4+/4 Tricep  ?*left UE at least 1/2 grade weaker than right (dominant side vs stroke related?).  ? ? ?NEUROLOGICAL: ? ?Mental Status ?Attention span and concentration are intact.  ?Expressive speech is intact.  ? ? ? ?Sensation ?Decreased light touch on LUE C3-C8, intact at C2 and T1. Grossly intact to light touch in BLE L2-S2.  ? ? ? ?Coordination/Cerebellar ?Finger to Nose: impaired left  ?Heel to Shin: WNL ?Rapid  alternating movements: WNL ?Finger Opposition: impaired left  ? ? ? ? ?ASSESSMENT ?Clinical Impression: Pt is a 81 year-old female referred for difficulty with balance and gait following a stroke identified in February of this year. Interestingly, stroke was identified in left temporal lobe however coordination and depth perception seems to be impaired in LUE rather than RUE. A leftward lean was noticed in sitting, standing and walking. Balance and functional strength will be assessed next session. Pt presents with deficits in strength, gait, balance, coordination, endurance and the vestibular system. She will benefit from skilled PT services to address these deficits for an improved QOL and ability to partake in hobbies such as gardening and travel to see family.  ? ? ?PLAN ?Next Visit: 6 minute walk test, BERG, FGA, gait velocity, TUG, 5xSTS, assess vestibular system, FOTO, ABC ?HEP: next session  ? ? ? ? ? ? ?Objective measurements completed on examination: See above findings.  ? ? ? ? ? PT Short Term Goals - 09/05/21 1829   ? ?  ? PT SHORT TERM GOAL #1  ? Title Pt will be independent with HEP in order to improve strength and balance in order to decrease fall risk and  improve function at home and work.   ? Time 4   ? Period Weeks   ? Status New   ? Target Date 10/03/21   ? ?  ?  ? ?  ? ? ? ? ? PT Long Term Goals - 09/05/21 1829   ? ?  ? PT LONG TERM GOAL #1  ? Title Patient will increase FOTO score to equal to or greater than __  to demonstrate statistically significant improvement in mobility and quality of life   ? Baseline 09/05/21: next session   ? Time 12   ? Period Weeks   ? Status New   ? Target Date 11/28/21   ?  ? PT LONG TERM GOAL #2  ? Title Patient will increase six minute walk test distance to >1011ft  for progression to community ambulator and improve gait ability.   ? Time 12   ? Period Weeks   ? Status New   ? Target Date 11/28/21   ? ?  ?  ? ?  ? ? ? ? ?Visit Diagnosis: ?Abnormality of gait and  mobility ? ?Muscle weakness (generalized) ? ?Unsteadiness on feet ? ?Other lack of coordination ? ? ? ? ?Problem List ?Patient Active Problem List  ? Diagnosis Date Noted  ? Asthma exacerbation, mild 05/29/2018  ?

## 2021-09-07 ENCOUNTER — Ambulatory Visit: Payer: Medicare Other | Admitting: Physical Therapy

## 2021-09-11 ENCOUNTER — Encounter: Payer: Self-pay | Admitting: Physical Therapy

## 2021-09-11 ENCOUNTER — Ambulatory Visit: Payer: Medicare Other | Admitting: Physical Therapy

## 2021-09-11 DIAGNOSIS — R269 Unspecified abnormalities of gait and mobility: Secondary | ICD-10-CM | POA: Diagnosis not present

## 2021-09-11 DIAGNOSIS — R2681 Unsteadiness on feet: Secondary | ICD-10-CM

## 2021-09-11 DIAGNOSIS — M6281 Muscle weakness (generalized): Secondary | ICD-10-CM

## 2021-09-11 DIAGNOSIS — R278 Other lack of coordination: Secondary | ICD-10-CM

## 2021-09-11 NOTE — Therapy (Deleted)
[facility] [address], [place], [address] Phone: [phone]   Fax:  [phone]  Physical Therapy Treatment  Patient Details  Name: Monica Bush MRN: [id number] Date of Birth: Sep 26, 1940 No data recorded  Encounter Date: 09/11/2021   PT End of Session - 09/11/21 1507     Visit Number 2    Number of Visits 25    Date for PT Re-Evaluation 11/28/21    Progress Note Due on Visit 10    PT Start Time 1052    PT Stop Time 1130    PT Time Calculation (min) 38 min    Activity Tolerance Patient tolerated treatment well    Behavior During Therapy WFL for tasks assessed/performed             Past Medical History:  Diagnosis Date   Anxiety    Asthma    CAD (coronary artery disease)    Hyperlipemia    Hypertension    Osteopetrosis    Reflux esophagitis     Past Surgical History:  Procedure Laterality Date   SHOULDER SURGERY      There were no vitals filed for this visit.      [facility] PT Assessment - 09/11/21 1741       Functional Gait  Assessment   Gait Level Surface Walks 20 ft in less than 5.5 sec, no assistive devices, good speed, no evidence for imbalance, normal gait pattern, deviates no more than 6 in outside of the 12 in walkway width.    Change in Gait Speed Makes only minor adjustments to walking speed, or accomplishes a change in speed with significant gait deviations, deviates 10-15 in outside the 12 in walkway width, or changes speed but loses balance but is able to recover and continue walking.    Gait with Horizontal Head Turns Performs head turns smoothly with slight change in gait velocity (eg, minor disruption to smooth gait path), deviates 6-10 in outside 12 in walkway width, or uses an assistive device.    Gait with Vertical Head Turns Performs task with slight change in gait velocity (eg, minor disruption to smooth gait path), deviates 6 - 10 in outside 12 in walkway width or uses  assistive device    Gait and Pivot Turn Pivot turns safely within 3 sec and stops quickly with no loss of balance.    Step Over Obstacle Is able to step over one shoe box (4.5 in total height) but must slow down and adjust steps to clear box safely. May require verbal cueing.    Gait with Narrow Base of Support Ambulates 4-7 steps.    Gait with Eyes Closed Walks 20 ft, slow speed, abnormal gait pattern, evidence for imbalance, deviates 10-15 in outside 12 in walkway width. Requires more than 9 sec to ambulate 20 ft.    Ambulating Backwards Walks 20 ft, slow speed, abnormal gait pattern, evidence for imbalance, deviates 10-15 in outside 12 in walkway width.    Steps Two feet to a stair, must use rail.    Total Score 16             TESTS AND MEASURES   6 minute walk test - 940ft  FGA - 16/30 Gait velocity -  self-selected 11.8 sec = 0.97m/s, 9.9 sec = 1.01 m/s 5xSTS - 21.1 seconds (occasional hands on knees and decreased steadiness upon standing) FOTO - 72 ABC - 70% DHI - 42/100  Physical 12/28  Emotional  14/36  Functional 16/36    Gait description: decreased RUE swing, decreased stance time on RLE during increased velocity, bilateral pronation/inversion, mild lateral drifting, forward posture.  Has 5 STE with left railing.    Next session: Assess vestibular system     Clinical Impression: Patient is pleasant and motivated throughout session although she arrives stating she does not feel well today. Pt states that she feels okay while walking so T&M were conducted as planned. Fatigue was endorsed following . Gait pattern changed depending on velocity - decreased stance phase on RLE was noticed during increased velocity. R arm swing is minimal. According to [facility], dizziness is significantly limiting pt participation in personal life and community. She will benefit from skilled PT services to address deficits in strength, balance, gait and vestibular system for an improved QOL  and ability to partake in hobbies such as gardening and travel to see family.           PT Short Term Goals - 09/05/21 1829       PT SHORT TERM GOAL #1   Title Pt will be independent with HEP in order to improve strength and balance in order to decrease fall risk and improve function at home and work.    Time 4    Period Weeks    Status New    Target Date 10/03/21               PT Long Term Goals - 09/11/21 1730       PT LONG TERM GOAL #1   Title Pt will decrease DHI score by at least 18 points in order to demonstrate clinically significant reduction in disability.    Baseline 09/11/21: 42/100    Time 12    Period Weeks    Status New    Target Date 11/28/21      PT LONG TERM GOAL #2   Title Pt will increase by at least 8m (17ft) in order to demonstrate clinically significant improvement in cardiopulmonary endurance and community ambulation.    Baseline 09/11/21: 965ft    Time 12    Period Weeks    Status New    Target Date 11/28/21      PT LONG TERM GOAL #3   Title Pt will decrease 5TSTS by at least 3 seconds in order to demonstrate clinically significant improvement in LE strength.    Baseline 09/11/21: 21.1 seconds (occasional hands on knees)    Time 12    Period Weeks    Status New    Target Date 11/28/21      PT LONG TERM GOAL #4   Title Patient will increase Functional Gait Assessment score to >20/30 as to reduce fall risk and improve dynamic gait safety with community ambulation.    Baseline 09/11/21: 16/30    Time 12    Period Weeks    Status New    Target Date 11/28/21                   Plan - 09/11/21 1508     Clinical Impression Statement Patient is pleasant and motivated throughout session although she arrives stating she does not feel well today. Pt states that she feels okay while walking so T&M were conducted as planned. Fatigue was endorsed following . Gait pattern changed depending on velocity - decreased stance phase on RLE  was noticed during increased velocity. R arm swing is minimal. According to [facility], dizziness is significantly limiting pt participation  in personal life and community. She will benefit from skilled PT services to address deficits in strength, balance, gait and vestibular system for an improved QOL and ability to partake in hobbies such as gardening and travel to see family.    Personal Factors and Comorbidities Age;Comorbidity 3+;Past/Current Experience;Fitness    Comorbidities anxiety, CAD, HTN, osteopetrosis, PTCA (percutaneous transluminal coronary angioplasty), long-covid and stroke.    Examination-Activity Limitations Bathing;Stairs;Bed Mobility;Lift;Locomotion Level;Squat;Carry    Examination-Participation Restrictions Cleaning;Yard Work;Community Activity;Laundry;Shop;Other;Meal Prep   hobbies - gardening, travel, cooking   Stability/Clinical Decision Making Evolving/Moderate complexity    Rehab Potential Good    PT Frequency 2x / week    PT Duration 12 weeks    PT Treatment/Interventions ADLs/Self Care Home Management;Aquatic Therapy;Cryotherapy;Electrical Stimulation;Canalith Repostioning;Iontophoresis 4mg /ml Dexamethasone;Moist Heat;Traction;Ultrasound;Parrafin;Fluidtherapy;DME Instruction;Gait training;Stair training;Functional mobility training;Therapeutic activities;Therapeutic exercise;Balance training;Neuromuscular re-education;Patient/family education;Manual techniques;Dry needling;Passive range of motion;Vestibular;Visual/perceptual remediation/compensation    PT Next Visit Plan 6 minute walk test, BERG, FGA, gait velocity, TUG, 5xSTS, assess vestibular system, FOTO, ABC    PT Home Exercise Plan next session    Consulted and Agree with Plan of Care Patient             Patient will benefit from skilled therapeutic intervention in order to improve the following deficits and impairments:  Abnormal gait, Decreased activity tolerance, Decreased endurance, Decreased strength, Dizziness,  Impaired sensation, Postural dysfunction, Difficulty walking, Decreased mobility, Decreased balance  Visit Diagnosis: Abnormality of gait and mobility  Muscle weakness (generalized)  Unsteadiness on feet  Other lack of coordination     Problem List Patient Active Problem List   Diagnosis Date Noted   Asthma exacerbation, mild 05/29/2018   CAD S/P percutaneous coronary angioplasty 05/29/2018   Osteoporosis 05/29/2018   Rotator cuff tendinitis, left 12/29/2014   Anxiety 07/01/2012   Hyperlipidemia 01/01/2011   Hypertension 01/01/2011    Basilia Jumbo PT, DPT   [facility] [address], [place], [address] Phone: [phone]   Fax:  [phone]  Name: Monica Bush MRN: [id number] Date of Birth: 19-May-1940

## 2021-09-11 NOTE — Therapy (Signed)
[facility] [address], [place], [address] Phone: [phone]   Fax:  [phone]  Physical Therapy Treatment  Patient Details  Name: Monica Bush MRN: [id number] Date of Birth: Sep 26, 1940 No data recorded  Encounter Date: 09/11/2021   PT End of Session - 09/11/21 1507     Visit Number 2    Number of Visits 25    Date for PT Re-Evaluation 11/28/21    Progress Note Due on Visit 10    PT Start Time 1052    PT Stop Time 1130    PT Time Calculation (min) 38 min    Activity Tolerance Patient tolerated treatment well    Behavior During Therapy WFL for tasks assessed/performed             Past Medical History:  Diagnosis Date   Anxiety    Asthma    CAD (coronary artery disease)    Hyperlipemia    Hypertension    Osteopetrosis    Reflux esophagitis     Past Surgical History:  Procedure Laterality Date   SHOULDER SURGERY      There were no vitals filed for this visit.      [facility] PT Assessment - 09/11/21 1741       Functional Gait  Assessment   Gait Level Surface Walks 20 ft in less than 5.5 sec, no assistive devices, good speed, no evidence for imbalance, normal gait pattern, deviates no more than 6 in outside of the 12 in walkway width.    Change in Gait Speed Makes only minor adjustments to walking speed, or accomplishes a change in speed with significant gait deviations, deviates 10-15 in outside the 12 in walkway width, or changes speed but loses balance but is able to recover and continue walking.    Gait with Horizontal Head Turns Performs head turns smoothly with slight change in gait velocity (eg, minor disruption to smooth gait path), deviates 6-10 in outside 12 in walkway width, or uses an assistive device.    Gait with Vertical Head Turns Performs task with slight change in gait velocity (eg, minor disruption to smooth gait path), deviates 6 - 10 in outside 12 in walkway width or uses  assistive device    Gait and Pivot Turn Pivot turns safely within 3 sec and stops quickly with no loss of balance.    Step Over Obstacle Is able to step over one shoe box (4.5 in total height) but must slow down and adjust steps to clear box safely. May require verbal cueing.    Gait with Narrow Base of Support Ambulates 4-7 steps.    Gait with Eyes Closed Walks 20 ft, slow speed, abnormal gait pattern, evidence for imbalance, deviates 10-15 in outside 12 in walkway width. Requires more than 9 sec to ambulate 20 ft.    Ambulating Backwards Walks 20 ft, slow speed, abnormal gait pattern, evidence for imbalance, deviates 10-15 in outside 12 in walkway width.    Steps Two feet to a stair, must use rail.    Total Score 16             TESTS AND MEASURES   6 minute walk test - 940ft  FGA - 16/30 Gait velocity -  self-selected 11.8 sec = 0.97m/s, 9.9 sec = 1.01 m/s 5xSTS - 21.1 seconds (occasional hands on knees and decreased steadiness upon standing) FOTO - 72 ABC - 70% DHI - 42/100  Physical 12/28  Emotional  14/36  Functional 16/36    Gait description: decreased RUE swing, decreased stance time on RLE during increased velocity, bilateral pronation/inversion, mild lateral drifting, forward posture.  Has 5 STE with left railing.    Next session: Assess vestibular system     Clinical Impression: Patient is pleasant and motivated throughout session although she arrives stating she does not feel well today. Pt states that she feels okay while walking so T&M were conducted as planned. Fatigue was endorsed following . Gait pattern changed depending on velocity - decreased stance phase on RLE was noticed during increased velocity. R arm swing is minimal. According to [facility], dizziness is significantly limiting pt participation in personal life and community. She will benefit from skilled PT services to address deficits in strength, balance, gait and vestibular system for an improved QOL  and ability to partake in hobbies such as gardening and travel to see family.           PT Short Term Goals - 09/05/21 1829       PT SHORT TERM GOAL #1   Title Pt will be independent with HEP in order to improve strength and balance in order to decrease fall risk and improve function at home and work.    Time 4    Period Weeks    Status New    Target Date 10/03/21               PT Long Term Goals - 09/11/21 1730       PT LONG TERM GOAL #1   Title Pt will decrease DHI score by at least 18 points in order to demonstrate clinically significant reduction in disability.    Baseline 09/11/21: 42/100    Time 12    Period Weeks    Status New    Target Date 11/28/21      PT LONG TERM GOAL #2   Title Pt will increase by at least 8m (17ft) in order to demonstrate clinically significant improvement in cardiopulmonary endurance and community ambulation.    Baseline 09/11/21: 965ft    Time 12    Period Weeks    Status New    Target Date 11/28/21      PT LONG TERM GOAL #3   Title Pt will decrease 5TSTS by at least 3 seconds in order to demonstrate clinically significant improvement in LE strength.    Baseline 09/11/21: 21.1 seconds (occasional hands on knees)    Time 12    Period Weeks    Status New    Target Date 11/28/21      PT LONG TERM GOAL #4   Title Patient will increase Functional Gait Assessment score to >20/30 as to reduce fall risk and improve dynamic gait safety with community ambulation.    Baseline 09/11/21: 16/30    Time 12    Period Weeks    Status New    Target Date 11/28/21                   Plan - 09/11/21 1508     Clinical Impression Statement Patient is pleasant and motivated throughout session although she arrives stating she does not feel well today. Pt states that she feels okay while walking so T&M were conducted as planned. Fatigue was endorsed following . Gait pattern changed depending on velocity - decreased stance phase on RLE  was noticed during increased velocity. R arm swing is minimal. According to [facility], dizziness is significantly limiting pt participation  in personal life and community. She will benefit from skilled PT services to address deficits in strength, balance, gait and vestibular system for an improved QOL and ability to partake in hobbies such as gardening and travel to see family.    Personal Factors and Comorbidities Age;Comorbidity 3+;Past/Current Experience;Fitness    Comorbidities anxiety, CAD, HTN, osteopetrosis, PTCA (percutaneous transluminal coronary angioplasty), long-covid and stroke.    Examination-Activity Limitations Bathing;Stairs;Bed Mobility;Lift;Locomotion Level;Squat;Carry    Examination-Participation Restrictions Cleaning;Yard Work;Community Activity;Laundry;Shop;Other;Meal Prep   hobbies - gardening, travel, cooking   Stability/Clinical Decision Making Evolving/Moderate complexity    Rehab Potential Good    PT Frequency 2x / week    PT Duration 12 weeks    PT Treatment/Interventions ADLs/Self Care Home Management;Aquatic Therapy;Cryotherapy;Electrical Stimulation;Canalith Repostioning;Iontophoresis 4mg /ml Dexamethasone;Moist Heat;Traction;Ultrasound;Parrafin;Fluidtherapy;DME Instruction;Gait training;Stair training;Functional mobility training;Therapeutic activities;Therapeutic exercise;Balance training;Neuromuscular re-education;Patient/family education;Manual techniques;Dry needling;Passive range of motion;Vestibular;Visual/perceptual remediation/compensation    PT Next Visit Plan 6 minute walk test, BERG, FGA, gait velocity, TUG, 5xSTS, assess vestibular system, FOTO, ABC    PT Home Exercise Plan next session    Consulted and Agree with Plan of Care Patient             Patient will benefit from skilled therapeutic intervention in order to improve the following deficits and impairments:  Abnormal gait, Decreased activity tolerance, Decreased endurance, Decreased strength, Dizziness,  Impaired sensation, Postural dysfunction, Difficulty walking, Decreased mobility, Decreased balance  Visit Diagnosis: Abnormality of gait and mobility  Muscle weakness (generalized)  Unsteadiness on feet  Other lack of coordination     Problem List Patient Active Problem List   Diagnosis Date Noted   Asthma exacerbation, mild 05/29/2018   CAD S/P percutaneous coronary angioplasty 05/29/2018   Osteoporosis 05/29/2018   Rotator cuff tendinitis, left 12/29/2014   Anxiety 07/01/2012   Hyperlipidemia 01/01/2011   Hypertension 01/01/2011    Basilia Jumbo PT, DPT   [facility] [address], [place], [address] Phone: [phone]   Fax:  [phone]  Name: Monica Bush MRN: [id number] Date of Birth: 19-May-1940

## 2021-09-13 ENCOUNTER — Ambulatory Visit: Payer: Medicare Other | Admitting: Physical Therapy

## 2021-09-13 ENCOUNTER — Encounter: Payer: Self-pay | Admitting: Physical Therapy

## 2021-09-13 DIAGNOSIS — R269 Unspecified abnormalities of gait and mobility: Secondary | ICD-10-CM

## 2021-09-13 DIAGNOSIS — R2681 Unsteadiness on feet: Secondary | ICD-10-CM

## 2021-09-13 DIAGNOSIS — M6281 Muscle weakness (generalized): Secondary | ICD-10-CM

## 2021-09-13 NOTE — Therapy (Signed)
[place] ?[facility] ?[address] ?[place], [place], [address] ?Phone: [phone]   Fax:  [phone] ? ?Physical Therapy Treatment ? ?Patient Details  ?Name: Monica Bush ?MRN: [id number] ?Date of Birth: Aug 30, 1940 ?No data recorded ? ?Encounter Date: 09/13/2021 ? ? PT End of Session - 09/13/21 1108   ? ? Visit Number 3   ? Number of Visits 25   ? Date for PT Re-Evaluation 11/28/21   ? Authorization - Visit Number 1   ? Progress Note Due on Visit 10   ? PT Start Time 1050   ? PT Stop Time 1130   ? PT Time Calculation (min) 40 min   ? Activity Tolerance Patient tolerated treatment well   ? Behavior During Therapy [facility] for tasks assessed/performed   ? ?  ?  ? ?  ? ? ?Past Medical History:  ?Diagnosis Date  ? Anxiety   ? Asthma   ? CAD (coronary artery disease)   ? Hyperlipemia   ? Hypertension   ? Osteopetrosis   ? Reflux esophagitis   ? ? ?Past Surgical History:  ?Procedure Laterality Date  ? SHOULDER SURGERY    ? ? ?There were no vitals filed for this visit. ? ? Subjective Assessment - 09/13/21 1056   ? ? Subjective Pt reports she has troubl with dizziness when laying on on her left side. Is feeling most affected by vertigo. No pain today, just feels "off" today, meaning she feels off balance, and extra tired.   ? Pertinent History Pt reports impaired balance, changes in gait and posture, decreased coordination and symptoms of vestibular disorder after experiencing a small stroke in February of 2023. Stroke went unidentified until an MRI was performed at th end of March. 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 To note, pt has "long covid" in which covid symptoms reappear about every 4 weeks and last 24-48 hours; this has been ongoing since May 2022. Symptoms include headache, body aches and nausea. Since February, pt becomes dizzy (described as spinning) when lying on her left side - states dizziness does not fully resolve for the duration of lying  in that position. MD prescribed medication for this. Pt reports feeling more fatigued than usual, sometimes sleeping >13 hours and taking naps during the day. Fatigue/low energy limit her ability to perform hobbies such as gardening, travelling and cooking. She also mentions intermittent vision changes in which vision becomes blurry - this started ~1 week ago. Occasional word-finding difficulty. Pt reports feeling weak, "like my strength has just been pulled out of me." She does report small, short-duration bursts of energy. PMH includes: anxiety, CAD, HTN, osteopetrosis, PTCA (percutaneous transluminal coronary angioplasty), long-covid and stroke.   ? Limitations Reading;Lifting;Standing;Walking;House hold activities   ? How long can you sit comfortably? unlimited   ? How long can you stand comfortably? 3 to 15 minutes (dependent on day)   ? How long can you walk comfortably? ranges between 77ft to 56ft (dependent on day)   ? Diagnostic tests MRI "Single microhemorrhage in the left inferior medial temporal lobe."   ? Patient Stated Goals would like to decrease dizziness, improve balance and endurance   ? ?  ?  ? ?  ? ? ? ?TESTS AND MEASURES  ? Nustep seat 5 UE 6 level 3 for gentle strengthening/warm up ? ?Able to ascend steps step through without handrail; descent can only complete step to with one handrail with excessive hip drop on step  down attempt ? ?Alt forward step downs x13 (to pt reported fatigue "need a rest") 2x 12 with bilat light touch to handrails; cuing for as much eccentric control as possible; CGA for safety, pt reports "I can feel the strength difference on my R side" ? ?Alt lateral step over 6in hurdle to brief hold in SLS for wt shifting and balance in SLS encouragement; to fatigue 23reps, followed by x20 cuing needed for full step to clear hurdle ? ?Amb 352ft with 15 hurdle step over negotiations (5 each 127ft) CGA for safety. Following patient feels very "off" which she reports subsides after  a minute in hooklying. Cuing throughout dynamic balance exercise for foot clearance with good carry over; CGA for safety  ? ? ?unable to complete SLS for any amount of time ? ? ? ? ? ? ? ? ? ? ? ? ? ? ? ? ? ? ? ? ? ? PT Education - 09/13/21 1104   ? ? Education Details therex form/technique   ? Person(s) Educated Patient   ? Methods Explanation;Demonstration;Verbal cues   ? Comprehension Verbalized understanding;Returned demonstration;Verbal cues required   ? ?  ?  ? ?  ? ? ? PT Short Term Goals - 09/05/21 1829   ? ?  ? PT SHORT TERM GOAL #1  ? Title Pt will be independent with HEP in order to improve strength and balance in order to decrease fall risk and improve function at home and work.   ? Time 4   ? Period Weeks   ? Status New   ? Target Date 10/03/21   ? ?  ?  ? ?  ? ? ? ? PT Long Term Goals - 09/11/21 1730   ? ?  ? PT LONG TERM GOAL #1  ? Title Pt will decrease DHI score by at least 18 points in order to demonstrate clinically significant reduction in disability.   ? Baseline 09/11/21: 42/100   ? Time 12   ? Period Weeks   ? Status New   ? Target Date 11/28/21   ?  ? PT LONG TERM GOAL #2  ? Title Pt will increase by at least 83m (144ft) in order to demonstrate clinically significant improvement in cardiopulmonary endurance and community ambulation.   ? Baseline 09/11/21: 983ft   ? Time 12   ? Period Weeks   ? Status New   ? Target Date 11/28/21   ?  ? PT LONG TERM GOAL #3  ? Title Pt will decrease 5TSTS by at least 3 seconds in order to demonstrate clinically significant improvement in LE strength.   ? Baseline 09/11/21: 21.1 seconds (occasional hands on knees)   ? Time 12   ? Period Weeks   ? Status New   ? Target Date 11/28/21   ?  ? PT LONG TERM GOAL #4  ? Title Patient will increase Functional Gait Assessment score to >20/30 as to reduce fall risk and improve dynamic gait safety with community ambulation.   ? Baseline 09/11/21: 16/30   ? Time 12   ? Period Weeks   ? Status New   ? Target Date 11/28/21   ? ?   ?  ? ?  ? ? ? ? ? ? ? ? Plan - 09/13/21 1130   ? ? Clinical Impression Statement Pt is limited throughout session by vertigo symptoms, managed well throughout session until dynamic balance with ambulation. Pt is able to complete dynamic balance exercise with endurance  rep range to also combat chronic fatigue with good carry over of multimodal cuing for proper technique, CGA for safety. Pt would be a better candidate for rehab at [facility] main due to post stroke and severe vestibular impairments, which is reportedly where her referral was initially sent. PT will reach out to main rehab for scheduling.   ? Personal Factors and Comorbidities Age;Comorbidity 3+;Past/Current Experience;Fitness   ? Comorbidities anxiety, CAD, HTN, osteopetrosis, PTCA (percutaneous transluminal coronary angioplasty), long-covid and stroke.   ? Examination-Activity Limitations Bathing;Stairs;Bed Mobility;Lift;Locomotion Level;Squat;Carry   ? Examination-Participation Restrictions Cleaning;Yard Work;Community Activity;Laundry;Shop;Other;Meal Prep   ? Stability/Clinical Decision Making Evolving/Moderate complexity   ? Clinical Decision Making Moderate   ? Rehab Potential Good   ? PT Frequency 2x / week   ? PT Duration 12 weeks   ? PT Treatment/Interventions ADLs/Self Care Home Management;Aquatic Therapy;Cryotherapy;Electrical Stimulation;Canalith Repostioning;Iontophoresis 4mg /ml Dexamethasone;Moist Heat;Traction;Ultrasound;Parrafin;Fluidtherapy;DME Instruction;Gait training;Stair training;Functional mobility training;Therapeutic activities;Therapeutic exercise;Balance training;Neuromuscular re-education;Patient/family education;Manual techniques;Dry needling;Passive range of motion;Vestibular;Visual/perceptual remediation/compensation   ? PT Home Exercise Plan SLS practice   ? Consulted and Agree with Plan of Care Patient   ? ?  ?  ? ?  ? ? ?Patient will benefit from skilled therapeutic intervention in order to improve the following deficits  and impairments:  Abnormal gait, Decreased activity tolerance, Decreased endurance, Decreased strength, Dizziness, Impaired sensation, Postural dysfunction, Difficulty walking, Decreased mobility, Dec

## 2021-09-19 ENCOUNTER — Encounter: Payer: Medicare Other | Admitting: Physical Therapy

## 2021-09-19 ENCOUNTER — Ambulatory Visit: Payer: Medicare Other | Admitting: Physical Therapy

## 2021-09-22 ENCOUNTER — Ambulatory Visit: Payer: Medicare Other

## 2021-09-22 ENCOUNTER — Encounter: Payer: Medicare Other | Admitting: Physical Therapy

## 2021-09-22 DIAGNOSIS — R269 Unspecified abnormalities of gait and mobility: Secondary | ICD-10-CM | POA: Diagnosis not present

## 2021-09-22 DIAGNOSIS — R42 Dizziness and giddiness: Secondary | ICD-10-CM

## 2021-09-22 NOTE — Therapy (Signed)
[place] MAIN [facility] SERVICES [place], [place], [address] Phone: [phone]   Fax:  [phone]  Physical Therapy Treatment  Patient Details  Name: Monica Bush MRN: [id number] Date of Birth: [birth year] No data recorded  Encounter Date: 09/22/2021   PT End of Session - 09/22/21 1241     Visit Number 4    Number of Visits 25    Date for PT Re-Evaluation 11/28/21    Authorization - Visit Number 1    Progress Note Due on Visit 10    PT Start Time 0935    PT Stop Time 1016    PT Time Calculation (min) 41 min    Activity Tolerance Patient tolerated treatment well    Behavior During Therapy WFL for tasks assessed/performed             Past Medical History:  Diagnosis Date   Anxiety    Asthma    CAD (coronary artery disease)    Hyperlipemia    Hypertension    Osteopetrosis    Reflux esophagitis     Past Surgical History:  Procedure Laterality Date   SHOULDER SURGERY      There were no vitals filed for this visit.   Subjective Assessment - 09/22/21 0935     Subjective Pt here for concerns of vertigo. She took meclizine prior to appointment. Pt with difficulty rolling to L side due to dizziness. Pt reports dizziness is sometimes spontaneous and occurs without movement. Pt describes her symptoms as the room spinning, but also reports lightheadedness. She reports this lasts for minutes and can make her nauseated. Pt reports she is taking meclizine up to 3x/day as needed. Pt is not dizzy currently. Pt has not fallen recently. Pt does not experience symptoms with looking at screens, busy stores. She has noticed that noise can bother her.  Pt has been seeing a Restaurant manager, fast food. She reports she has had "2 adjustments this week," to her neck at her chiropractor appointment. She reports her symptoms have not improved or increased following adjustements.    Pertinent History Pt reports impaired balance, changes in gait and posture,  decreased coordination and symptoms of vestibular disorder after experiencing a small stroke in February of 2023. Stroke went unidentified until an MRI was performed at th end of March. 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 To note, pt has "long covid" in which covid symptoms reappear about every 4 weeks and last 24-48 hours; this has been ongoing since May 2022. Symptoms include headache, body aches and nausea. Since February, pt becomes dizzy (described as spinning) when lying on her left side - states dizziness does not fully resolve for the duration of lying in that position. MD prescribed medication for this. Pt reports feeling more fatigued than usual, sometimes sleeping >13 hours and taking naps during the day. Fatigue/low energy limit her ability to perform hobbies such as gardening, travelling and cooking. She also mentions intermittent vision changes in which vision becomes blurry - this started ~1 week ago. Occasional word-finding difficulty. Pt reports feeling weak, "like my strength has just been pulled out of me." She does report small, short-duration bursts of energy. PMH includes: anxiety, CAD, HTN, osteopetrosis, PTCA (percutaneous transluminal coronary angioplasty), long-covid and stroke.    Limitations Reading;Lifting;Standing;Walking;House hold activities    How long can you sit comfortably? unlimited    How long can you stand comfortably? 3 to 15 minutes (dependent on day)    How long can you  walk comfortably? ranges between 8ft to 586ft (dependent on day)    Diagnostic tests MRI "Single microhemorrhage in the left inferior medial temporal lobe."    Patient Stated Goals would like to decrease dizziness, improve balance and endurance    Currently in Pain? No/denies             INTERVENTIONS - further assessment completed as pt with chronic dizziness  Saccades: hypometric makes pt feel dizzy Smooth pursuits: abnormal, saccadic, makes pt feel dizzy Ocular ROM:  WNL Vergence - Pt reports seeing double at approx 1 ft. VOR - horizontal head turns, target "x," pt reports this increases dizziness. Pt dizziness increased to 3/10.   Dix Hallpike: R - negative, no nystagmus, no symptoms reported. However, upon sitting up pt reports lightheaded feeling that required a couple minutes of rest to resolve. L - Inconclusive. No nystagmus noted (however, pt took Meclizine prior to appointment).  Pt with reported delayed onset of dizziness. Pt reported the wallpaper looked as if it were moving but not spinning. Pt rated dizziness as 5/10. This lasted >1 minute.   L side Epley provided 1x. Pt with no symptoms in second and third position of maneuver. However, continues to report some lightheadedness or a "light feeling" upon sitting up that takes a few minutes to resolve.   Orthostatics Seated BP LUE: 126/75 mmHg, HR 70 bpm Standing BP LUE: 128/81 mmHg 78 bpm Pt reports no symptoms  PT instructs pt not to attempt anything challenging to gait or balance for remainder of day and to monitor her symptoms, seek care if symptoms significantly worsen.   Pt reports dizziness decreases to 1/10 by end of session. Pt able to ambulate without decreased balance.    PT Education - 09/22/21 1239     Education Details assessment, Marye Round, Epley.  PT does not recommend pt receive cervical manipulation at this time due to unclear cause of dizziness.    Person(s) Educated Patient    Methods Explanation;Demonstration;Verbal cues;Tactile cues    Comprehension Verbalized understanding;Returned demonstration              PT Short Term Goals - 09/05/21 1829       PT SHORT TERM GOAL #1   Title Pt will be independent with HEP in order to improve strength and balance in order to decrease fall risk and improve function at home and work.    Time 4    Period Weeks    Status New    Target Date 10/03/21               PT Long Term Goals - 09/11/21 1730       PT  LONG TERM GOAL #1   Title Pt will decrease DHI score by at least 18 points in order to demonstrate clinically significant reduction in disability.    Baseline 09/11/21: 42/100    Time 12    Period Weeks    Status New    Target Date 11/28/21      PT LONG TERM GOAL #2   Title Pt will increase 6MWT by at least 31m (168ft) in order to demonstrate clinically significant improvement in cardiopulmonary endurance and community ambulation.    Baseline 09/11/21: 947ft    Time 12    Period Weeks    Status New    Target Date 11/28/21      PT LONG TERM GOAL #3   Title Pt will decrease 5TSTS by at least 3 seconds in order to  demonstrate clinically significant improvement in LE strength.    Baseline 09/11/21: 21.1 seconds (occasional hands on knees)    Time 12    Period Weeks    Status New    Target Date 11/28/21      PT LONG TERM GOAL #4   Title Patient will increase Functional Gait Assessment score to >20/30 as to reduce fall risk and improve dynamic gait safety with community ambulation.    Baseline 09/11/21: 16/30    Time 12    Period Weeks    Status New    Target Date 11/28/21                   Plan - 09/22/21 1307     Clinical Impression Statement Vestibular assessment initiated on this date. Pt with mixed central and peripheral indicators. Pt with abnormal smooth pursuits and saccades. R Dix Hallpike test was negative and L side was inconclusive (pt had taken meclizine prior to appointment). While no nystagmus noted with L side testing, pt did report dizziness of 5/10 and described the room/wallpaper as moving but not spinning. PT provided L side Epley on this date. Pt also with reports of ligtheaded feeling throughout when going from a supine to seated position. Orthostatic testing from seated to standing was normal. Will screen supine>seated BPs next 1-2 visits. The pt will benefit from further skilled PT to address dizziness and balance in order to increase QOL and decrease fall risk.     Personal Factors and Comorbidities Age;Comorbidity 3+;Past/Current Experience;Fitness    Comorbidities anxiety, CAD, HTN, osteopetrosis, PTCA (percutaneous transluminal coronary angioplasty), long-covid and stroke.    Examination-Activity Limitations Bathing;Stairs;Bed Mobility;Lift;Locomotion Level;Squat;Carry    Examination-Participation Restrictions Cleaning;Yard Work;Community Activity;Laundry;Shop;Other;Meal Prep    Stability/Clinical Decision Making Evolving/Moderate complexity    Rehab Potential Good    PT Frequency 2x / week    PT Duration 12 weeks    PT Treatment/Interventions ADLs/Self Care Home Management;Aquatic Therapy;Cryotherapy;Electrical Stimulation;Canalith Repostioning;Iontophoresis 4mg /ml Dexamethasone;Moist Heat;Traction;Ultrasound;Parrafin;Fluidtherapy;DME Instruction;Gait training;Stair training;Functional mobility training;Therapeutic activities;Therapeutic exercise;Balance training;Neuromuscular re-education;Patient/family education;Manual techniques;Dry needling;Passive range of motion;Vestibular;Visual/perceptual remediation/compensation    PT Next Visit Plan further vestibular assessment, VOR, balance    PT Home Exercise Plan SLS practice; no updates    Consulted and Agree with Plan of Care Patient             Patient will benefit from skilled therapeutic intervention in order to improve the following deficits and impairments:  Abnormal gait, Decreased activity tolerance, Decreased endurance, Decreased strength, Dizziness, Impaired sensation, Postural dysfunction, Difficulty walking, Decreased mobility, Decreased balance  Visit Diagnosis: Dizziness and giddiness     Problem List Patient Active Problem List   Diagnosis Date Noted   Asthma exacerbation, mild 05/29/2018   CAD S/P percutaneous coronary angioplasty 05/29/2018   Osteoporosis 05/29/2018   Rotator cuff tendinitis, left 12/29/2014   Anxiety 07/01/2012   Hyperlipidemia 01/01/2011    Hypertension 01/01/2011    Zollie Pee, PT 09/22/2021, 1:15 PM  [place] [address] [place], [place], [address] Phone: [phone]   Fax:  [phone]  Name: Monica Bush MRN: [id number] Date of Birth: Sep 06, 1940

## 2021-09-25 ENCOUNTER — Encounter: Payer: Medicare Other | Admitting: Physical Therapy

## 2021-09-27 ENCOUNTER — Encounter: Payer: Medicare Other | Admitting: Physical Therapy

## 2021-09-28 ENCOUNTER — Ambulatory Visit: Payer: Medicare Other

## 2021-09-28 DIAGNOSIS — R269 Unspecified abnormalities of gait and mobility: Secondary | ICD-10-CM | POA: Diagnosis not present

## 2021-09-28 DIAGNOSIS — R42 Dizziness and giddiness: Secondary | ICD-10-CM

## 2021-09-28 DIAGNOSIS — R2681 Unsteadiness on feet: Secondary | ICD-10-CM

## 2021-09-28 NOTE — Therapy (Signed)
[place] [facility] MAIN [facility] [address] [place], [place], [address] Phone: [phone]   Fax:  [phone]  Physical Therapy Treatment  Patient Details  Name: Monica Bush MRN: [id number] Date of Birth: 07/04/1940 No data recorded  Encounter Date: 09/28/2021   PT End of Session - 09/28/21 1640     Visit Number 5    Number of Visits 25    Date for PT Re-Evaluation 11/28/21    Authorization - Visit Number 1    Progress Note Due on Visit 10    PT Start Time 1355    PT Stop Time 1430    PT Time Calculation (min) 35 min    Activity Tolerance Patient tolerated treatment well    Behavior During Therapy WFL for tasks assessed/performed             Past Medical History:  Diagnosis Date   Anxiety    Asthma    CAD (coronary artery disease)    Hyperlipemia    Hypertension    Osteopetrosis    Reflux esophagitis     Past Surgical History:  Procedure Laterality Date   SHOULDER SURGERY      There were no vitals filed for this visit.   Subjective Assessment - 09/28/21 1355     Subjective Pt reports dizziness is currently a 3/10. She reports dizziness is a little less today and has improved somewhat since previous appointment. Pt reports once she eats something in the morning she feels a bit better. She describes this is a wooziness, but it is not generalized and feels as if it is in her head.    Pertinent History Pt reports impaired balance, changes in gait and posture, decreased coordination and symptoms of vestibular disorder after experiencing a small stroke in February of 2023. Stroke went unidentified until an MRI was performed at th end of March. 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 To note, pt has "long covid" in which covid symptoms reappear about every 4 weeks and last 24-48 hours; this has been ongoing since May 2022. Symptoms include headache, body aches and nausea. Since February, pt becomes dizzy (described as  spinning) when lying on her left side - states dizziness does not fully resolve for the duration of lying in that position. MD prescribed medication for this. Pt reports feeling more fatigued than usual, sometimes sleeping >13 hours and taking naps during the day. Fatigue/low energy limit her ability to perform hobbies such as gardening, travelling and cooking. She also mentions intermittent vision changes in which vision becomes blurry - this started ~1 week ago. Occasional word-finding difficulty. Pt reports feeling weak, "like my strength has just been pulled out of me." She does report small, short-duration bursts of energy. PMH includes: anxiety, CAD, HTN, osteopetrosis, PTCA (percutaneous transluminal coronary angioplasty), long-covid and stroke.    Limitations Reading;Lifting;Standing;Walking;House hold activities    How long can you sit comfortably? unlimited    How long can you stand comfortably? 3 to 15 minutes (dependent on day)    How long can you walk comfortably? ranges between 7750ft to 55500ft (dependent on day)    Diagnostic tests MRI "Single microhemorrhage in the left inferior medial temporal lobe."    Patient Stated Goals would like to decrease dizziness, improve balance and endurance    Currently in Pain? No/denies            INTERVENTIONS -    Instructed pt in and provided pt with symptom tracker handout.  Dix Hallpike:  L side: positive. Delayed onset by <30 sec, upbeating to R, torsional nystagmus, duration <15 sec (decays). Pt does report perceived dizziness that lasts under 30 seconds.   Pt provides Epley to L 3x. No nystagmus noted with second and third maneuvers. No dizziness present by third maneuver, however, some lightheaded sensation reported upon sitting.   Negative R Dix Hallpike  Pt educated throughout session about proper posture and technique with exercises. Improved exercise technique, movement at target joints, use of target muscles after min to mod  verbal, visual, tactile cues.   PT Education - 09/28/21 1639     Education Details Symptom tracker handout    Person(s) Educated Patient    Methods Explanation    Comprehension Verbalized understanding;Need further instruction              PT Short Term Goals - 09/05/21 1829       PT SHORT TERM GOAL #1   Title Pt will be independent with HEP in order to improve strength and balance in order to decrease fall risk and improve function at home and work.    Time 4    Period Weeks    Status New    Target Date 10/03/21               PT Long Term Goals - 09/11/21 1730       PT LONG TERM GOAL #1   Title Pt will decrease DHI score by at least 18 points in order to demonstrate clinically significant reduction in disability.    Baseline 09/11/21: 42/100    Time 12    Period Weeks    Status New    Target Date 11/28/21      PT LONG TERM GOAL #2   Title Pt will increase by at least 63m (123ft) in order to demonstrate clinically significant improvement in cardiopulmonary endurance and community ambulation.    Baseline 09/11/21: 926ft    Time 12    Period Weeks    Status New    Target Date 11/28/21      PT LONG TERM GOAL #3   Title Pt will decrease 5TSTS by at least 3 seconds in order to demonstrate clinically significant improvement in LE strength.    Baseline 09/11/21: 21.1 seconds (occasional hands on knees)    Time 12    Period Weeks    Status New    Target Date 11/28/21      PT LONG TERM GOAL #4   Title Patient will increase Functional Gait Assessment score to >20/30 as to reduce fall risk and improve dynamic gait safety with community ambulation.    Baseline 09/11/21: 16/30    Time 12    Period Weeks    Status New    Target Date 11/28/21                   Plan - 09/28/21 1640     Clinical Impression Statement Session somewhat limited secondary to pt late arrival, however pt highly motivated to participate. Gilberto Better retested. Pt today did exhibit  positive L DH with nystagmus that is torsional, upbeating to R with a duration of <15 sec. PT provided 3 Epley manuevers to treat L side. Pt with only lightheaded sensation upon sitting with last maneuver, no more nystagmus present, and no dizziness with any of the testing positions. Will continue to monitor and provide further intervention as needed next session. The pt will benefit from further  skilled PT to improve dizziness and balance to decrease fall risk.    Personal Factors and Comorbidities Age;Comorbidity 3+;Past/Current Experience;Fitness    Comorbidities anxiety, CAD, HTN, osteopetrosis, PTCA (percutaneous transluminal coronary angioplasty), long-covid and stroke.    Examination-Activity Limitations Bathing;Stairs;Bed Mobility;Lift;Locomotion Level;Squat;Carry    Examination-Participation Restrictions Cleaning;Yard Work;Community Activity;Laundry;Shop;Other;Meal Prep    Stability/Clinical Decision Making Evolving/Moderate complexity    Rehab Potential Good    PT Frequency 2x / week    PT Duration 12 weeks    PT Treatment/Interventions ADLs/Self Care Home Management;Aquatic Therapy;Cryotherapy;Electrical Stimulation;Canalith Repostioning;Iontophoresis 4mg /ml Dexamethasone;Moist Heat;Traction;Ultrasound;Parrafin;Fluidtherapy;DME Instruction;Gait training;Stair training;Functional mobility training;Therapeutic activities;Therapeutic exercise;Balance training;Neuromuscular re-education;Patient/family education;Manual techniques;Dry needling;Passive range of motion;Vestibular;Visual/perceptual remediation/compensation    PT Next Visit Plan further vestibular assessment, VOR, balance    PT Home Exercise Plan SLS practice; no updates    Consulted and Agree with Plan of Care Patient             Patient will benefit from skilled therapeutic intervention in order to improve the following deficits and impairments:  Abnormal gait, Decreased activity tolerance, Decreased endurance, Decreased  strength, Dizziness, Impaired sensation, Postural dysfunction, Difficulty walking, Decreased mobility, Decreased balance  Visit Diagnosis: Dizziness and giddiness  Unsteadiness on feet     Problem List Patient Active Problem List   Diagnosis Date Noted   Asthma exacerbation, mild 05/29/2018   CAD S/P percutaneous coronary angioplasty 05/29/2018   Osteoporosis 05/29/2018   Rotator cuff tendinitis, left 12/29/2014   Anxiety 07/01/2012   Hyperlipidemia 01/01/2011   Hypertension 01/01/2011    01/03/2011, PT 09/28/2021, 4:52 PM  [facility] MAIN [facility] SERVICES [address] [place], [place], [place] Phone: [phone]   Fax:  [phone]  Name: Monica Bush MRN: Monica Loges Date of Birth: March 20, 1941

## 2021-10-03 ENCOUNTER — Encounter: Payer: Medicare Other | Admitting: Physical Therapy

## 2021-10-03 ENCOUNTER — Ambulatory Visit: Payer: Medicare Other

## 2021-10-17 ENCOUNTER — Other Ambulatory Visit: Payer: Self-pay | Admitting: Family Medicine

## 2021-10-17 DIAGNOSIS — Z780 Asymptomatic menopausal state: Secondary | ICD-10-CM

## 2021-10-30 ENCOUNTER — Telehealth: Payer: Self-pay

## 2021-10-30 ENCOUNTER — Ambulatory Visit: Payer: Medicare Other | Attending: Family Medicine

## 2021-11-02 ENCOUNTER — Ambulatory Visit: Payer: Medicare Other

## 2021-11-10 ENCOUNTER — Other Ambulatory Visit: Payer: Medicare Other

## 2021-11-14 ENCOUNTER — Ambulatory Visit: Payer: Medicare Other | Attending: Family Medicine

## 2021-11-16 ENCOUNTER — Ambulatory Visit: Payer: Medicare Other

## 2021-11-20 ENCOUNTER — Ambulatory Visit: Payer: Medicare Other

## 2021-11-22 ENCOUNTER — Ambulatory Visit: Payer: Medicare Other

## 2021-11-27 ENCOUNTER — Ambulatory Visit: Payer: Medicare Other

## 2021-12-07 ENCOUNTER — Ambulatory Visit
Admission: RE | Admit: 2021-12-07 | Discharge: 2021-12-07 | Disposition: A | Discharge status: Home / Self Care | Payer: Medicare Other | Source: Ambulatory Visit | Attending: Family Medicine | Admitting: Family Medicine

## 2021-12-07 DIAGNOSIS — Z780 Asymptomatic menopausal state: Secondary | ICD-10-CM | POA: Diagnosis present

## 2021-12-11 ENCOUNTER — Emergency Department: Payer: Medicare Other

## 2021-12-11 ENCOUNTER — Other Ambulatory Visit: Payer: Self-pay

## 2021-12-11 ENCOUNTER — Encounter: Payer: Self-pay | Admitting: Emergency Medicine

## 2021-12-11 ENCOUNTER — Emergency Department
Admission: EM | Admit: 2021-12-11 | Discharge: 2021-12-11 | Disposition: A | Discharge status: Home / Self Care | Payer: Medicare Other | Attending: Emergency Medicine | Admitting: Emergency Medicine

## 2021-12-11 DIAGNOSIS — Z7902 Long term (current) use of antithrombotics/antiplatelets: Secondary | ICD-10-CM | POA: Diagnosis not present

## 2021-12-11 DIAGNOSIS — Z7982 Long term (current) use of aspirin: Secondary | ICD-10-CM | POA: Insufficient documentation

## 2021-12-11 DIAGNOSIS — I10 Essential (primary) hypertension: Secondary | ICD-10-CM | POA: Insufficient documentation

## 2021-12-11 DIAGNOSIS — R42 Dizziness and giddiness: Secondary | ICD-10-CM | POA: Insufficient documentation

## 2021-12-11 DIAGNOSIS — G459 Transient cerebral ischemic attack, unspecified: Secondary | ICD-10-CM

## 2021-12-11 LAB — CBC WITH DIFFERENTIAL/PLATELET
Abs Immature Granulocytes: 0.03 10*3/uL (ref 0.00–0.07)
Basophils Absolute: 0.1 10*3/uL (ref 0.0–0.1)
Basophils Relative: 1 %
Eosinophils Absolute: 0.2 10*3/uL (ref 0.0–0.5)
Eosinophils Relative: 3 %
HCT: 42.6 % (ref 36.0–46.0)
Hemoglobin: 13.9 g/dL (ref 12.0–15.0)
Immature Granulocytes: 0 %
Lymphocytes Relative: 22 %
Lymphs Abs: 1.6 10*3/uL (ref 0.7–4.0)
MCH: 29.1 pg (ref 26.0–34.0)
MCHC: 32.6 g/dL (ref 30.0–36.0)
MCV: 89.3 fL (ref 80.0–100.0)
Monocytes Absolute: 0.7 10*3/uL (ref 0.1–1.0)
Monocytes Relative: 9 %
Neutro Abs: 4.5 10*3/uL (ref 1.7–7.7)
Neutrophils Relative %: 65 %
Platelets: 271 10*3/uL (ref 150–400)
RBC: 4.77 MIL/uL (ref 3.87–5.11)
RDW: 13.4 % (ref 11.5–15.5)
WBC: 6.9 10*3/uL (ref 4.0–10.5)
nRBC: 0 % (ref 0.0–0.2)

## 2021-12-11 LAB — BASIC METABOLIC PANEL
Anion gap: 7 (ref 5–15)
BUN: 20 mg/dL (ref 8–23)
CO2: 26 mmol/L (ref 22–32)
Calcium: 9.3 mg/dL (ref 8.9–10.3)
Chloride: 108 mmol/L (ref 98–111)
Creatinine, Ser: 0.91 mg/dL (ref 0.44–1.00)
GFR, Estimated: >60 mL/min (ref >60)
Glucose, Bld: 109 mg/dL — ABNORMAL HIGH (ref 70–99)
Potassium: 4 mmol/L (ref 3.5–5.1)
Sodium: 141 mmol/L (ref 135–145)

## 2021-12-11 LAB — TROPONIN I (HIGH SENSITIVITY): Troponin I (High Sensitivity): 4 ng/L (ref <18)

## 2021-12-11 NOTE — ED Provider Triage Note (Signed)
  Emergency Medicine Provider Triage Evaluation Note  Monica Bush , a 81 y.o.female,  was evaluated in triage.  Pt complains of  Reported seizure-like symptoms approximately 2 days ago.  She states that she was talking with someone she felt like she was having difficulty finding her words.  She is not actively parents any symptoms at this time.  Review of Systems  Positive: Confusion Negative: Denies fever, chest pain, vomiting  Physical Exam  There were no vitals filed for this visit. Gen:   Awake, no distress   Resp:  Normal effort  MSK:   Moves extremities without difficulty  Other:  No gross neurological deficits.  No facial droop.  No weakness in upper or lower extremities.  Medical Decision Making  Given the patient's initial medical screening exam, the following diagnostic evaluation has been ordered. The patient will be placed in the appropriate treatment space, once one is available, to complete the evaluation and treatment. I have discussed the plan of care with the patient and I have advised the patient that an ED physician or mid-level practitioner will reevaluate their condition after the test results have been received, as the results may give them additional insight into the type of treatment they may need.    Diagnostics: Labs, Head CT  Treatments: none immediately   Varney Daily, [place] 12/11/21 1635

## 2021-12-11 NOTE — ED Triage Notes (Signed)
Patient arrives ambulatory concerned for signs of a stroke. Patient reports over the weekend feeling light headed and trouble finding her words. Denies any slurred speech. Symptoms resolved and not currently having any neuro deficits.

## 2021-12-11 NOTE — ED Notes (Signed)
Pt DC to home, Dc instructions reviewed with pt with all questions answered. Pt verbalizes understanding. Pt ambualtory out of dept with steady gait

## 2021-12-11 NOTE — Discharge Instructions (Addendum)
As we discussed, there is still some concern that you could have had a stroke causing your symptoms. Please return to the ER when you are able tomorrow morning to have further evaluation including an MRI of the brain conducted to evaluate for possible new stroke.  Please continue on your current medications.  Return to the emergency room right away if you develop new symptoms or your symptoms return such as trouble speaking, finding words, dizziness, numbness weakness, headache or other concerns arise.  Call Dr. Malvin Johns to set up a close follow-up visit tomorrow as well.

## 2021-12-11 NOTE — ED Provider Notes (Signed)
[facility] Provider Note    Event Date/Time   First MD Initiated Contact with Patient 12/11/21 2051     (approximate)   History   Dizziness   HPI {Remember to add pertinent medical, surgical, social, and/or OB history to HPI:1} Monica Bush is a 81 y.o. female  ***     This Saturday, awoke with ***  Physical Exam   Triage Vital Signs: ED Triage Vitals  Enc Vitals Group     BP 12/11/21 1656 138/65     Pulse Rate 12/11/21 1656 69     Resp 12/11/21 1656 18     Temp 12/11/21 1656 98.9 F (37.2 C)     Temp Source 12/11/21 1656 Oral     SpO2 12/11/21 1656 97 %     Weight 12/11/21 1657 174 lb (78.9 kg)     Height 12/11/21 1657 5' (1.524 m)     Head Circumference --      Peak Flow --      Pain Score 12/11/21 1656 0     Pain Loc --      Pain Edu? --      Excl. in [place]? --     Most recent vital signs: Vitals:   12/11/21 1656  BP: 138/65  Pulse: 69  Resp: 18  Temp: 98.9 F (37.2 C)  SpO2: 97%    {Only need to document appropriate and relevant physical exam:1} General: Awake, no distress. *** CV:  Good peripheral perfusion. *** Resp:  Normal effort. *** Abd:  No distention. *** Other:  ***   ED Results / Procedures / Treatments   Labs (all labs ordered are listed, but only abnormal results are displayed) Labs Reviewed  BASIC METABOLIC PANEL - Abnormal; Notable for the following components:      Result Value   Glucose, Bld 109 (*)    All other components within normal limits  CBC WITH DIFFERENTIAL/PLATELET  TROPONIN I (HIGH SENSITIVITY)     EKG  ***   RADIOLOGY *** {USE THE WORD "INTERPRETED"!! You MUST document your own interpretation of imaging, as well as the fact that you reviewed the radiologist's report!:1}   PROCEDURES:  Critical Care performed: {CriticalCareYesNo:19197::"Yes, see critical care procedure note(s)","No"}  Procedures   MEDICATIONS ORDERED IN ED: Medications - No data to  display   IMPRESSION / MDM / ASSESSMENT AND PLAN / ED COURSE  I reviewed the triage vital signs and the nursing notes.                              Differential diagnosis includes, but is not limited to, ***  Patient's presentation is most consistent with {EM COPA:27473}  {If the patient is on the monitor, remove the brackets and asterisks on the sentence below and remember to document it as a Procedure as well. Otherwise delete the sentence below:1} {**The patient is on the cardiac monitor to evaluate for evidence of arrhythmia and/or significant heart rate changes.**} {Remember to include, when applicable, any/all of the following data: independent review of imaging independent review of labs (comment specifically on pertinent positives and negatives) review of specific prior hospitalizations, PCP/specialist notes, etc. discuss meds given and prescribed document any discussion with consultants (including hospitalists) any clinical decision tools you used and why (PECARN, NEXUS, etc.) did you consider admitting the patient? document social determinants of health affecting patient's care (homelessness, inability to follow up in a timely fashion, etc)  document any pre-existing conditions increasing risk on current visit (e.g. diabetes and HTN increasing danger of high-risk chest pain/ACS) describes what meds you gave (especially parenteral) and why any other interventions?:1} Clinical Course as of 12/11/21 2109  Mon Dec 11, 2021  2031 Glucose(!): 109 [BS]    Clinical Course User Index [BS] Varney Daily, PA     FINAL CLINICAL IMPRESSION(S) / ED DIAGNOSES   Final diagnoses:  None     Rx / DC Orders   ED Discharge Orders     None        Note:  This document was prepared using Dragon voice recognition software and may include unintentional dictation errors.

## 2022-07-08 ENCOUNTER — Other Ambulatory Visit: Payer: Self-pay

## 2022-07-08 ENCOUNTER — Emergency Department
Admission: EM | Admit: 2022-07-08 | Discharge: 2022-07-08 | Disposition: A | Discharge status: Home / Self Care | Payer: Medicare Other | Attending: Emergency Medicine | Admitting: Emergency Medicine

## 2022-07-08 ENCOUNTER — Emergency Department: Payer: Medicare Other

## 2022-07-08 ENCOUNTER — Encounter: Payer: Self-pay | Admitting: Intensive Care

## 2022-07-08 DIAGNOSIS — I2510 Atherosclerotic heart disease of native coronary artery without angina pectoris: Secondary | ICD-10-CM | POA: Diagnosis not present

## 2022-07-08 DIAGNOSIS — Z8673 Personal history of transient ischemic attack (TIA), and cerebral infarction without residual deficits: Secondary | ICD-10-CM | POA: Insufficient documentation

## 2022-07-08 DIAGNOSIS — I10 Essential (primary) hypertension: Secondary | ICD-10-CM | POA: Insufficient documentation

## 2022-07-08 DIAGNOSIS — R0602 Shortness of breath: Secondary | ICD-10-CM | POA: Diagnosis not present

## 2022-07-08 DIAGNOSIS — J45909 Unspecified asthma, uncomplicated: Secondary | ICD-10-CM | POA: Insufficient documentation

## 2022-07-08 DIAGNOSIS — R29810 Facial weakness: Secondary | ICD-10-CM | POA: Insufficient documentation

## 2022-07-08 HISTORY — DX: Cerebral infarction, unspecified: I63.9

## 2022-07-08 LAB — CBC
HCT: 40.8 % (ref 36.0–46.0)
Hemoglobin: 13.6 g/dL (ref 12.0–15.0)
MCH: 29.3 pg (ref 26.0–34.0)
MCHC: 33.3 g/dL (ref 30.0–36.0)
MCV: 87.9 fL (ref 80.0–100.0)
Platelets: 268 10*3/uL (ref 150–400)
RBC: 4.64 MIL/uL (ref 3.87–5.11)
RDW: 13.5 % (ref 11.5–15.5)
WBC: 5.3 10*3/uL (ref 4.0–10.5)
nRBC: 0 % (ref 0.0–0.2)

## 2022-07-08 LAB — PROTIME-INR
INR: 1 (ref 0.8–1.2)
Prothrombin Time: 13.1 seconds (ref 11.4–15.2)

## 2022-07-08 LAB — CBG MONITORING, ED: Glucose-Capillary: 106 mg/dL — ABNORMAL HIGH (ref 70–99)

## 2022-07-08 LAB — DIFFERENTIAL
Abs Immature Granulocytes: 0.02 10*3/uL (ref 0.00–0.07)
Basophils Absolute: 0.1 10*3/uL (ref 0.0–0.1)
Basophils Relative: 1 %
Eosinophils Absolute: 0.1 10*3/uL (ref 0.0–0.5)
Eosinophils Relative: 2 %
Immature Granulocytes: 0 %
Lymphocytes Relative: 23 %
Lymphs Abs: 1.2 10*3/uL (ref 0.7–4.0)
Monocytes Absolute: 0.5 10*3/uL (ref 0.1–1.0)
Monocytes Relative: 10 %
Neutro Abs: 3.4 10*3/uL (ref 1.7–7.7)
Neutrophils Relative %: 64 %

## 2022-07-08 LAB — COMPREHENSIVE METABOLIC PANEL
ALT: 19 U/L (ref 0–44)
AST: 20 U/L (ref 15–41)
Albumin: 4 g/dL (ref 3.5–5.0)
Alkaline Phosphatase: 73 U/L (ref 38–126)
Anion gap: 10 (ref 5–15)
BUN: 16 mg/dL (ref 8–23)
CO2: 23 mmol/L (ref 22–32)
Calcium: 9.2 mg/dL (ref 8.9–10.3)
Chloride: 107 mmol/L (ref 98–111)
Creatinine, Ser: 0.75 mg/dL (ref 0.44–1.00)
GFR, Estimated: >60 mL/min (ref >60)
Glucose, Bld: 111 mg/dL — ABNORMAL HIGH (ref 70–99)
Potassium: 3.8 mmol/L (ref 3.5–5.1)
Sodium: 140 mmol/L (ref 135–145)
Total Bilirubin: 0.9 mg/dL (ref 0.3–1.2)
Total Protein: 7 g/dL (ref 6.5–8.1)

## 2022-07-08 LAB — ETHANOL: Alcohol, Ethyl (B): <10 mg/dL (ref <10)

## 2022-07-08 LAB — APTT: aPTT: 28 seconds (ref 24–36)

## 2022-07-08 NOTE — Discharge Instructions (Signed)
As we discussed your workup today in the emergency department including the MRI of your brain is normal.  Please follow-up with your primary care doctor as soon as possible regarding your symptoms on today's ER visit.  Return to the emergency department for any further weakness numbness confusion or any other symptom personally concerning to yourself.

## 2022-07-08 NOTE — ED Triage Notes (Signed)
Patient reports noticing a right sided facial droop around 10pm last night when taking a selfie of herself. Patient cannot give a LKW except to say "Saturday"   Patient denies any other symptoms.

## 2022-07-08 NOTE — ED Provider Notes (Signed)
[facility] Provider Note    Event Date/Time   First MD Initiated Contact with Patient 07/08/22 1538     (approximate)  History   Chief Complaint: Cerebrovascular Accident  HPI  Monica Bush is a 82 y.o. female with a past medical history anxiety, asthma, CAD, hypertension, hyperlipidemia, prior CVA who presents to the emergency department for right facial droop.  According to the patient she took a selfie yesterday around 10 PM and noticed in the picture that the right side of her face seemed to be drooping so she came to the emergency department today for an evaluation.  Patient states a history of a prior CVA which she brought the MRI report however the MRI reports as no CVA noted.  Patient states with her past self-reported CVA she had ataxia and felt off balance to the right.  Patient denies any weakness or numbness of any arm or leg denies any confusion or slurred speech.  Patient's only complaint is a right facial droop seen in a picture where she did show me on her phone possible minimal right facial droop noted.  No obvious right facial droop noted on exam today.  Physical Exam   Triage Vital Signs: ED Triage Vitals [07/08/22 1441]  Enc Vitals Group     BP 114/64     Pulse Rate 84     Resp 18     Temp 98.5 F (36.9 C)     Temp Source Oral     SpO2 98 %     Weight 177 lb (80.3 kg)     Height 5' (1.524 m)     Head Circumference      Peak Flow      Pain Score 0     Pain Loc      Pain Edu?      Excl. in [place]?     Most recent vital signs: Vitals:   07/08/22 1441  BP: 114/64  Pulse: 84  Resp: 18  Temp: 98.5 F (36.9 C)  SpO2: 98%    General: Awake, no distress.  CV:  Good peripheral perfusion.  Regular rate and rhythm  Resp:  Normal effort.  Equal breath sounds bilaterally.  Abd:  No distention.  Soft, nontender.  No rebound or guarding. Other:  Cranial nerves appear intact no obvious facial droop.  Equal grip strength bilaterally with no  pronator drift, 5/5 motor in all extremities.   ED Results / Procedures / Treatments   EKG  EKG viewed and interpreted by myself shows a normal sinus rhythm at 81 bpm with a narrow QRS, normal axis, normal intervals, no concerning ST changes.  RADIOLOGY  I have reviewed and interpreted CT head images.  No bleed seen on my evaluation. Radiology is read the CT scan as negative for acute abnormality.   MEDICATIONS ORDERED IN ED: Medications - No data to display   IMPRESSION / MDM / [place] / ED COURSE  I reviewed the triage vital signs and the nursing notes.  Patient's presentation is most consistent with acute presentation with potential threat to life or bodily function.  Patient presents emergency department for right facial droop noted on a selfie that the patient took last night.  No obvious facial droop seen on my examination today in fact normal neurological exam during my evaluation.  Patient is lab work is reassuring normal CBC, normal chemistry negative ethanol.  CT scan shows no acute findings.  EKG shows a normal sinus  rhythm.  Given the patient's reported history of CVA previously and symptoms last night we will proceed with an MRI to further evaluate.  No obvious cranial nerve deficits during my exam.  It is possible that the patient had a facial droop last night that is since resolved or consistent with a TIA.  Unlikely to be related to Bell's palsy given no facial droop currently.  It is also possible that the mild right droop seen in the picture could have been produced by the manner in which the picture was taken.  I believe if the patient's MRI is positive she will obviously require admission to the hospital for further workup and treatment, however if the MRI is negative I believe the patient could be safely discharged home with PCP follow-up to discuss further workup if deemed necessary, given the patient's recent stroke workup within the past 1  year.  Patient's MRI is negative.  CBC is normal chemistry is normal alcohol negative.  Given the patient's reassuring workup I believe she would be safe for discharge home with PCP follow-up.  Patient agreeable to plan of care.  FINAL CLINICAL IMPRESSION(S) / ED DIAGNOSES   Facial droop  Note:  This document was prepared using Dragon voice recognition software and may include unintentional dictation errors.   Harvest Dark, MD 07/08/22 2036

## 2022-08-31 ENCOUNTER — Other Ambulatory Visit: Payer: Self-pay

## 2022-08-31 ENCOUNTER — Emergency Department
Admission: EM | Admit: 2022-08-31 | Discharge: 2022-08-31 | Disposition: A | Discharge status: Home / Self Care | Payer: Medicare Other | Attending: Emergency Medicine | Admitting: Emergency Medicine

## 2022-08-31 ENCOUNTER — Emergency Department: Payer: Medicare Other

## 2022-08-31 DIAGNOSIS — I2510 Atherosclerotic heart disease of native coronary artery without angina pectoris: Secondary | ICD-10-CM | POA: Diagnosis not present

## 2022-08-31 DIAGNOSIS — R079 Chest pain, unspecified: Secondary | ICD-10-CM | POA: Insufficient documentation

## 2022-08-31 DIAGNOSIS — I10 Essential (primary) hypertension: Secondary | ICD-10-CM | POA: Insufficient documentation

## 2022-08-31 DIAGNOSIS — R0609 Other forms of dyspnea: Secondary | ICD-10-CM | POA: Insufficient documentation

## 2022-08-31 LAB — BASIC METABOLIC PANEL
Anion gap: 8 (ref 5–15)
BUN: 19 mg/dL (ref 8–23)
CO2: 25 mmol/L (ref 22–32)
Calcium: 9.6 mg/dL (ref 8.9–10.3)
Chloride: 106 mmol/L (ref 98–111)
Creatinine, Ser: 0.81 mg/dL (ref 0.44–1.00)
GFR, Estimated: >60 mL/min (ref >60)
Glucose, Bld: 117 mg/dL — ABNORMAL HIGH (ref 70–99)
Potassium: 3.6 mmol/L (ref 3.5–5.1)
Sodium: 139 mmol/L (ref 135–145)

## 2022-08-31 LAB — CBC
HCT: 40.8 % (ref 36.0–46.0)
Hemoglobin: 13.3 g/dL (ref 12.0–15.0)
MCH: 28.7 pg (ref 26.0–34.0)
MCHC: 32.6 g/dL (ref 30.0–36.0)
MCV: 87.9 fL (ref 80.0–100.0)
Platelets: 272 10*3/uL (ref 150–400)
RBC: 4.64 MIL/uL (ref 3.87–5.11)
RDW: 13.8 % (ref 11.5–15.5)
WBC: 4.5 10*3/uL (ref 4.0–10.5)
nRBC: 0 % (ref 0.0–0.2)

## 2022-08-31 LAB — TROPONIN I (HIGH SENSITIVITY)
Troponin I (High Sensitivity): 5 ng/L (ref <18)
Troponin I (High Sensitivity): 4 ng/L (ref <18)

## 2022-08-31 LAB — PROCALCITONIN: Procalcitonin: <0.1 ng/mL

## 2022-08-31 LAB — D-DIMER, QUANTITATIVE: D-Dimer, Quant: 0.33 ug/mL-FEU (ref 0.00–0.50)

## 2022-08-31 LAB — BRAIN NATRIURETIC PEPTIDE: B Natriuretic Peptide: 49.8 pg/mL (ref 0.0–100.0)

## 2022-08-31 NOTE — ED Provider Notes (Signed)
[facility] Provider Note    Event Date/Time   First MD Initiated Contact with Patient 08/31/22 1058     (approximate)   History   Chest Pain   HPI  Monica Bush is a 82 y.o. female history of hypertension coronary disease and previous cardiac stenting remotely   Patient presents for evaluation after episode of chest pressure.  Chest pressure occurred shortly before arrival, reports was about 5 out of 10 lasted quite briefly and was really alleviated to a 1 out of 10 discomfort after taking nitroglycerin.  She also reports that for about 1 month now she has been experiencing shortness of breath when she walks back and forth to her shed to feed her birds.  This been ongoing in nature, and she has to stop and rest.  She decided to call her cardiologist today to set up an appointment at [facility], and she reports as she was on the phone with them she started experiencing a feeling of light chest pressure.  No fever or chills.  She reports that she suffers from a chronic cough every morning, not new for her.  She is otherwise feeling well.  She has been a "long COVID" patient.  Patient denies any recent surgeries or admissions.  No leg swelling.  No sharp chest pains.  No history of blood clots  Physical Exam   Triage Vital Signs: ED Triage Vitals  Enc Vitals Group     BP 08/31/22 0958 131/78     Pulse Rate 08/31/22 0958 70     Resp 08/31/22 0958 16     Temp 08/31/22 0958 98.4 F (36.9 C)     Temp Source 08/31/22 0958 Oral     SpO2 08/31/22 0958 98 %     Weight --      Height --      Head Circumference --      Peak Flow --      Pain Score 08/31/22 1003 1     Pain Loc --      Pain Edu? --      Excl. in GC? --     Most recent vital signs: Vitals:   08/31/22 1400 08/31/22 1500  BP: 127/82 (!) 143/84  Pulse: 84 (!) 144  Resp: 14 18  Temp:    SpO2: 95% 100%      General: Awake, no distress.  CV:  Good peripheral perfusion.  Normal tones and rate.   I auscultated quite carefully and detect no murmur Resp:  Normal effort.  Clear bilaterally.  Her work of breathing quite normal. Abd:  No distention.  Soft nontender nondistended Other:  No lower extremity edema venous cords or congestion.   ED Results / Procedures / Treatments   Labs (all labs ordered are listed, but only abnormal results are displayed) Labs Reviewed  BASIC METABOLIC PANEL - Abnormal; Notable for the following components:      Result Value   Glucose, Bld 117 (*)    All other components within normal limits  CBC  PROCALCITONIN  BRAIN NATRIURETIC PEPTIDE  D-DIMER, QUANTITATIVE  TROPONIN I (HIGH SENSITIVITY)  TROPONIN I (HIGH SENSITIVITY)   CBC normal metabolic panel normal troponin normal on first check.  Second troponin wnl  EKG  And interpreted by me at 10 AM heart rate 70 QRS 80 QTc 420 Normal sinus rhythm no evidence of acute ischemia or ectopy   RADIOLOGY  Chest x-ray interpreted by me as negative for acute finding.  Reviewed radiologist report though, and did notes possible atelectasis or infiltrate right lower lung base   DG Chest 2 View  Result Date: 08/31/2022 CLINICAL DATA:  Chest pain EXAM: CHEST - 2 VIEW COMPARISON:  CXR 02/10/20 FINDINGS: No pleural effusion. No pneumothorax. There is a hazy opacity at the right lung base, which could represent atelectasis or infection. Vertebral body heights are maintained. Normal cardiac and mediastinal contours. No radiographically apparent displaced rib fractures. There are prominent bilateral interstitial opacities, unchanged from prior exam. IMPRESSION: Hazy opacity at the right lung base, which could represent atelectasis or infection. Electronically Signed   By: Lorenza Cambridge M.D.   On: 08/31/2022 10:43         PROCEDURES:  Critical Care performed: No  Procedures   MEDICATIONS ORDERED IN ED: Medications - No data to display   IMPRESSION / MDM / ASSESSMENT AND PLAN / ED COURSE  I reviewed the  triage vital signs and the nursing notes.                              Differential diagnosis includes, but is not limited to, possible angina, ACS, rule out ACS, less likely and minimal risk factors noted for pulmonary embolism, no symptoms highly suggestive of infection, pneumothorax, stress or anxiety, dissection, etc.  Clinical signs and symptoms do not match that of dissection.  Imaging of the chest including chest x-ray shows normal lung fields with exception to what I suspect is likely mild atelectasis in the right lung base if she has no clear infectious symptoms, white count is normal, afebrile, and procalcitonin is low.  Additionally, her EKG shows no acute ischemia and her troponins are both normal.  I discussed her case and care with Dr. Juliann Pares of cardiology, and se advises that he can see her in follow-up on Monday.  Discussed patient's past medical history, EKG, troponins, clinical symptoms with Dr. Juliann Pares. his clinic will call and set up an appointment Monday.  Patient agreeable with this plan and knows that she will be able to continue her care with her cardiologist whom she likes at [facility] in [place], but in the interim will have follow-up here Monday  She will continue her current medication regimen.  We are very careful or reviewed return precautions regarding chest pain and chest pressure as well as symptoms of dyspnea.  She will not exert herself physically beyond her home, and plans to follow-up with cardiology Monday  Patient feels improved, no ongoing chest pain accept for a very minor discomfort when she coughs slightly.  Return precautions and treatment recommendations and follow-up discussed with the patient who is agreeable with the plan.   Patient's presentation is most consistent with acute complicated illness / injury requiring diagnostic workup.   The patient is on the cardiac monitor to evaluate for evidence of arrhythmia and/or significant heart rate changes.    ----------------------------------------- 3:05 PM on 08/31/2022 ----------------------------------------- Labs remarkable for 2 normal troponins.  Normal CBC and metabolic panel.  Exclusionary D-dimer in the setting of low risk for PE  FINAL CLINICAL IMPRESSION(S) / ED DIAGNOSES   Final diagnoses:  Chest pain with low risk for cardiac etiology  Dyspnea on exertion     Rx / DC Orders   ED Discharge Orders          Ordered    Ambulatory referral to Cardiology       Comments: If you have not heard from  the Cardiology office within the next 72 hours please call [phone].   08/31/22 1504             Note:  This document was prepared using Dragon voice recognition software and may include unintentional dictation errors.   Sharyn Creamer, MD 08/31/22 [phone]

## 2022-08-31 NOTE — ED Triage Notes (Addendum)
Pt comes via EMs from home with c/o cp. Pt states straight across and cross and is pressure. Pt states it is nonradiating 4/10. Pt took 1 nitro and pain down to 1/10. 18 g in right wrist. Pt states some sob  BP-104/76 HR-82 98% RA 324 aspirin.

## 2022-09-03 ENCOUNTER — Emergency Department
Admission: EM | Admit: 2022-09-03 | Discharge: 2022-09-03 | Disposition: A | Discharge status: Home / Self Care | Payer: Medicare Other | Attending: Emergency Medicine | Admitting: Emergency Medicine

## 2022-09-03 ENCOUNTER — Emergency Department: Payer: Medicare Other

## 2022-09-03 ENCOUNTER — Other Ambulatory Visit: Payer: Self-pay

## 2022-09-03 DIAGNOSIS — R42 Dizziness and giddiness: Secondary | ICD-10-CM | POA: Insufficient documentation

## 2022-09-03 DIAGNOSIS — I2510 Atherosclerotic heart disease of native coronary artery without angina pectoris: Secondary | ICD-10-CM | POA: Insufficient documentation

## 2022-09-03 DIAGNOSIS — I10 Essential (primary) hypertension: Secondary | ICD-10-CM | POA: Diagnosis not present

## 2022-09-03 DIAGNOSIS — R079 Chest pain, unspecified: Secondary | ICD-10-CM | POA: Insufficient documentation

## 2022-09-03 LAB — BASIC METABOLIC PANEL
Anion gap: 11 (ref 5–15)
BUN: 32 mg/dL — ABNORMAL HIGH (ref 8–23)
CO2: 21 mmol/L — ABNORMAL LOW (ref 22–32)
Calcium: 9 mg/dL (ref 8.9–10.3)
Chloride: 107 mmol/L (ref 98–111)
Creatinine, Ser: 1.16 mg/dL — ABNORMAL HIGH (ref 0.44–1.00)
GFR, Estimated: 48 mL/min — ABNORMAL LOW (ref >60)
Glucose, Bld: 132 mg/dL — ABNORMAL HIGH (ref 70–99)
Potassium: 3.3 mmol/L — ABNORMAL LOW (ref 3.5–5.1)
Sodium: 139 mmol/L (ref 135–145)

## 2022-09-03 LAB — CBC
HCT: 41.5 % (ref 36.0–46.0)
Hemoglobin: 13.5 g/dL (ref 12.0–15.0)
MCH: 29.5 pg (ref 26.0–34.0)
MCHC: 32.5 g/dL (ref 30.0–36.0)
MCV: 90.8 fL (ref 80.0–100.0)
Platelets: 257 10*3/uL (ref 150–400)
RBC: 4.57 MIL/uL (ref 3.87–5.11)
RDW: 13.9 % (ref 11.5–15.5)
WBC: 9.4 10*3/uL (ref 4.0–10.5)
nRBC: 0 % (ref 0.0–0.2)

## 2022-09-03 LAB — TROPONIN I (HIGH SENSITIVITY)
Troponin I (High Sensitivity): 4 ng/L (ref <18)
Troponin I (High Sensitivity): 5 ng/L (ref <18)

## 2022-09-03 NOTE — ED Notes (Signed)
First nurse note- pt brought in from [employer].  Pt reports chest pain.  Pt saw dr Juliann Pares today.iv in place.  Pt had asa, nitro x 2, nitro patch prior to ems arrival per ems.  Cbg 147 per ems.  Bp 99/65, p-85, oxygen sats 95%  pt alert, sitting in wheelchair in lobby.

## 2022-09-03 NOTE — ED Triage Notes (Signed)
Pt comes via EMs wth c/o cp. Pt states it is currently gone. Pt states earlier it was straight across chest. Pt was seen here recently for same.

## 2022-09-03 NOTE — Discharge Instructions (Signed)
Your workup in the emergency department has shown reassuring results.  As we discussed please follow-up with your cardiologist as soon as possible.  Return to the emergency department for any return of/worsening chest pain, trouble breathing or any other symptom personally concerning to yourself.

## 2022-09-03 NOTE — ED Provider Notes (Signed)
[facility] Provider Note    Event Date/Time   First MD Initiated Contact with Patient 09/03/22 1944     (approximate)  History   Chief Complaint: Chest Pain  HPI  Monica Bush is a 82 y.o. female with a past medical history of anxiety, CAD, hypertension, hyperlipidemia, presents to the emergency department for chest pain.  According to the patient she has frequent angina.  States she gets angina type discomfort in her chest on average every other day or so per patient.  States she recently saw her heart doctor this morning in [place] for her chest pain.  He is having the patient follow-up with her normal cardiologist for an echocardiogram and a stress test but that has not yet been scheduled per patient.  Patient states he called her in a nitroglycerin patch however when she picked it up from the pharmacy and got home she noted that it was nitroglycerin pills and not a patch.  She called the office back and they change the prescription back to a patch.  She went back to the pharmacy to pick up the patch however while walking through the grocery store to get to the pharmacy she developed pain in the center of her chest.  Patient states exertional chest pain is fairly common for her.  She took one of the nitroglycerin pills that was given to her the first time however shortly afterwards she began feeling very hot and lightheaded like she might pass out.  Patient states within several minutes the chest pain was gone but she continued to feel lightheaded.  The pharmacist called EMS to bring the patient to the emergency department.  Here patient denies any chest pain at any point since arrival.  Denies shortness of breath nausea or diaphoresis.  Physical Exam   Triage Vital Signs: ED Triage Vitals  Enc Vitals Group     BP 09/03/22 1802 112/70     Pulse Rate 09/03/22 1802 79     Resp 09/03/22 1802 18     Temp 09/03/22 1802 98 F (36.7 C)     Temp src --      SpO2 09/03/22  1802 100 %     Weight --      Height --      Head Circumference --      Peak Flow --      Pain Score 09/03/22 1809 0     Pain Loc --      Pain Edu? --      Excl. in GC? --     Most recent vital signs: Vitals:   09/03/22 1802  BP: 112/70  Pulse: 79  Resp: 18  Temp: 98 F (36.7 C)  SpO2: 100%    General: Awake, no distress.  CV:  Good peripheral perfusion.  Regular rate and rhythm  Resp:  Normal effort.  Equal breath sounds bilaterally.  Abd:  No distention.  Soft, nontender.  No rebound or guarding.  ED Results / Procedures / Treatments   EKG  EKG viewed and interpreted by myself shows normal sinus rhythm at 78 bpm with a narrow QRS, normal axis, normal intervals, no concerning ST changes.  RADIOLOGY  I have reviewed and interpreted the chest x-ray images.  Cardiomegaly but no consolidation seen on my evaluation. Radiology has read the x-ray as negative.   MEDICATIONS ORDERED IN ED: Medications - No data to display   IMPRESSION / MDM / ASSESSMENT AND PLAN / ED COURSE  I reviewed the triage vital signs and the nursing notes.  Patient's presentation is most consistent with acute presentation with potential threat to life or bodily function.  Patient presents emergency department for chest pain.  States that history of angina/exertional chest pain.  Pain today occurred and the patient took nitroglycerin.  States the pain resolved however shortly after she developed flushed feeling and lightheadedness like she might pass out.  EMS was called and brought the patient to the emergency department.  Patient states no chest pain shortness of breath nausea or diaphoresis since arriving here.  Overall the patient appears well.  Patient is lab work shows a normal chemistry with just slight reduction in renal function.  Patient states she is not drinking much of anything today due to her doctor appointment this morning and then this occurring this afternoon.  Patient CBC is normal.   Troponin is reassuringly negative.  Chest x-ray is clear and EKG reassuring.  Given the patient's symptoms just prior to arrival we will recheck a troponin.  Given that symptoms occurred after nitroglycerin is possible that the patient symptoms are related to the nitroglycerin tablet itself or possible brief hypotension from this.  If the repeat troponin remains negative I believe the patient could be safely discharged home and follow-up with her outpatient cardiologist.  If the troponin is elevated and the patient will require admission for further workup and treatment.  Patient agreeable to this plan.  Patient's repeat troponin remains negative.  Given the reassuring workup I believe the patient is safe for discharge home with PCP follow-up and follow-up with her outpatient cardiologist.  Patient agreeable to plan of care.  FINAL CLINICAL IMPRESSION(S) / ED DIAGNOSES   Chest pain Lightheadedness   Note:  This document was prepared using Dragon voice recognition software and may include unintentional dictation errors.   Minna Antis, MD 09/03/22 2148

## 2022-09-05 NOTE — Progress Notes (Signed)
This is not my patient.  I believe seen by Dr. Fanny Bien.

## 2023-09-23 ENCOUNTER — Other Ambulatory Visit
Admission: RE | Admit: 2023-09-23 | Discharge: 2023-09-23 | Disposition: A | Discharge status: Home / Self Care | Source: Ambulatory Visit | Attending: Internal Medicine | Admitting: Internal Medicine

## 2023-09-23 DIAGNOSIS — M79605 Pain in left leg: Secondary | ICD-10-CM | POA: Insufficient documentation

## 2023-09-23 LAB — D-DIMER, QUANTITATIVE: D-Dimer, Quant: <0.27 ug{FEU}/mL (ref 0.00–0.50)

## 2024-01-28 ENCOUNTER — Other Ambulatory Visit: Payer: Self-pay | Admitting: Family Medicine

## 2024-01-28 DIAGNOSIS — Z780 Asymptomatic menopausal state: Secondary | ICD-10-CM
